# Patient Record
Sex: Female | Born: 1950 | Race: White | Hispanic: No | Smoking: Never smoker
Health system: Southern US, Community
[De-identification: ages and names within clinical notes are randomized; demographics above are authoritative.]

## PROBLEM LIST (undated history)

## (undated) DIAGNOSIS — R42 Dizziness and giddiness: Secondary | ICD-10-CM

## (undated) DIAGNOSIS — J45909 Unspecified asthma, uncomplicated: Secondary | ICD-10-CM

## (undated) DIAGNOSIS — G250 Essential tremor: Secondary | ICD-10-CM

## (undated) DIAGNOSIS — M810 Age-related osteoporosis without current pathological fracture: Secondary | ICD-10-CM

## (undated) DIAGNOSIS — E538 Deficiency of other specified B group vitamins: Secondary | ICD-10-CM

## (undated) DIAGNOSIS — E7800 Pure hypercholesterolemia, unspecified: Secondary | ICD-10-CM

## (undated) HISTORY — DX: Pure hypercholesterolemia, unspecified: E78.00

## (undated) HISTORY — DX: Age-related osteoporosis without current pathological fracture: M81.0

## (undated) HISTORY — PX: TUBAL LIGATION: SHX77

## (undated) HISTORY — DX: Essential tremor: G25.0

## (undated) HISTORY — DX: Dizziness and giddiness: R42

## (undated) HISTORY — PX: TONSILLECTOMY: SUR1361

## (undated) HISTORY — PX: KYPHOPLASTY: SHX5884

---

## 1997-11-16 ENCOUNTER — Other Ambulatory Visit: Admission: RE | Admit: 1997-11-16 | Discharge: 1997-11-16 | Payer: Self-pay | Admitting: Obstetrics and Gynecology

## 2000-12-01 ENCOUNTER — Other Ambulatory Visit: Admission: RE | Admit: 2000-12-01 | Discharge: 2000-12-01 | Payer: Self-pay | Admitting: Obstetrics and Gynecology

## 2006-05-06 ENCOUNTER — Other Ambulatory Visit: Admission: RE | Admit: 2006-05-06 | Discharge: 2006-05-06 | Payer: Self-pay | Admitting: Obstetrics and Gynecology

## 2006-06-24 ENCOUNTER — Encounter: Admission: RE | Admit: 2006-06-24 | Discharge: 2006-07-21 | Payer: Self-pay | Admitting: Family Medicine

## 2007-01-13 DIAGNOSIS — M810 Age-related osteoporosis without current pathological fracture: Secondary | ICD-10-CM

## 2007-01-13 HISTORY — DX: Age-related osteoporosis without current pathological fracture: M81.0

## 2007-07-20 ENCOUNTER — Other Ambulatory Visit: Admission: RE | Admit: 2007-07-20 | Discharge: 2007-07-20 | Payer: Self-pay | Admitting: Obstetrics and Gynecology

## 2007-07-29 ENCOUNTER — Ambulatory Visit (HOSPITAL_COMMUNITY): Admission: RE | Admit: 2007-07-29 | Discharge: 2007-07-29 | Payer: Self-pay | Admitting: Family Medicine

## 2007-08-12 ENCOUNTER — Encounter: Admission: RE | Admit: 2007-08-12 | Discharge: 2007-08-12 | Payer: Self-pay | Admitting: Family Medicine

## 2008-11-15 ENCOUNTER — Encounter: Admission: RE | Admit: 2008-11-15 | Discharge: 2008-11-15 | Payer: Self-pay | Admitting: Family Medicine

## 2009-12-19 ENCOUNTER — Ambulatory Visit (HOSPITAL_COMMUNITY)
Admission: RE | Admit: 2009-12-19 | Discharge: 2009-12-19 | Payer: Self-pay | Source: Home / Self Care | Attending: Neurological Surgery | Admitting: Neurological Surgery

## 2010-02-03 ENCOUNTER — Encounter: Payer: Self-pay | Admitting: Family Medicine

## 2010-03-25 LAB — BASIC METABOLIC PANEL
CO2: 24 mEq/L (ref 19–32)
Calcium: 9.5 mg/dL (ref 8.4–10.5)
Chloride: 106 mEq/L (ref 96–112)
GFR calc Af Amer: >60 mL/min (ref >60)
GFR calc non Af Amer: >60 mL/min (ref >60)
Glucose, Bld: 104 mg/dL — ABNORMAL HIGH (ref 70–99)
Potassium: 4.1 mEq/L (ref 3.5–5.1)

## 2010-03-25 LAB — CBC
MCH: 32.4 pg (ref 26.0–34.0)
MCHC: 33.4 g/dL (ref 30.0–36.0)
RDW: 13.7 % (ref 11.5–15.5)

## 2010-03-25 LAB — SURGICAL PCR SCREEN: MRSA, PCR: NEGATIVE

## 2010-09-09 ENCOUNTER — Other Ambulatory Visit (HOSPITAL_COMMUNITY): Payer: Self-pay | Admitting: Family Medicine

## 2010-09-10 ENCOUNTER — Other Ambulatory Visit: Payer: Self-pay | Admitting: Family Medicine

## 2010-09-10 DIAGNOSIS — N6489 Other specified disorders of breast: Secondary | ICD-10-CM

## 2010-09-10 DIAGNOSIS — N63 Unspecified lump in unspecified breast: Secondary | ICD-10-CM

## 2010-09-11 ENCOUNTER — Other Ambulatory Visit: Payer: Self-pay | Admitting: Family Medicine

## 2010-09-11 DIAGNOSIS — N63 Unspecified lump in unspecified breast: Secondary | ICD-10-CM

## 2010-09-11 DIAGNOSIS — N6489 Other specified disorders of breast: Secondary | ICD-10-CM

## 2010-09-18 ENCOUNTER — Ambulatory Visit
Admission: RE | Admit: 2010-09-18 | Discharge: 2010-09-18 | Disposition: A | Discharge status: Home / Self Care | Payer: 59 | Source: Ambulatory Visit | Attending: Family Medicine | Admitting: Family Medicine

## 2010-09-18 DIAGNOSIS — N6489 Other specified disorders of breast: Secondary | ICD-10-CM

## 2010-09-18 DIAGNOSIS — N63 Unspecified lump in unspecified breast: Secondary | ICD-10-CM

## 2010-10-16 ENCOUNTER — Other Ambulatory Visit: Payer: Self-pay | Admitting: Family Medicine

## 2010-10-16 DIAGNOSIS — R945 Abnormal results of liver function studies: Secondary | ICD-10-CM

## 2010-10-28 ENCOUNTER — Ambulatory Visit
Admission: RE | Admit: 2010-10-28 | Discharge: 2010-10-28 | Disposition: A | Discharge status: Home / Self Care | Payer: 59 | Source: Ambulatory Visit | Attending: Family Medicine | Admitting: Family Medicine

## 2010-10-28 DIAGNOSIS — R945 Abnormal results of liver function studies: Secondary | ICD-10-CM

## 2011-01-30 ENCOUNTER — Other Ambulatory Visit: Payer: Self-pay | Admitting: Endocrinology

## 2011-01-30 DIAGNOSIS — R748 Abnormal levels of other serum enzymes: Secondary | ICD-10-CM

## 2011-02-17 ENCOUNTER — Ambulatory Visit (HOSPITAL_COMMUNITY): Payer: 59

## 2011-02-17 ENCOUNTER — Encounter (HOSPITAL_COMMUNITY): Payer: 59

## 2011-03-06 ENCOUNTER — Encounter (HOSPITAL_COMMUNITY)
Admission: RE | Admit: 2011-03-06 | Discharge: 2011-03-06 | Disposition: A | Discharge status: Home / Self Care | Payer: 59 | Source: Ambulatory Visit | Attending: Endocrinology | Admitting: Endocrinology

## 2011-03-06 ENCOUNTER — Ambulatory Visit (HOSPITAL_COMMUNITY)
Admission: RE | Admit: 2011-03-06 | Discharge: 2011-03-06 | Disposition: A | Discharge status: Home / Self Care | Payer: 59 | Source: Ambulatory Visit | Attending: Endocrinology | Admitting: Endocrinology

## 2011-03-06 DIAGNOSIS — R748 Abnormal levels of other serum enzymes: Secondary | ICD-10-CM | POA: Insufficient documentation

## 2011-03-06 MED ORDER — TECHNETIUM TC 99M MEDRONATE IV KIT
25.0000 | PACK | Freq: Once | INTRAVENOUS | Status: AC | PRN
  Administered 2011-03-06: 25 via INTRAVENOUS

## 2012-03-14 ENCOUNTER — Other Ambulatory Visit: Payer: Self-pay

## 2012-03-14 DIAGNOSIS — Z1231 Encounter for screening mammogram for malignant neoplasm of breast: Secondary | ICD-10-CM

## 2012-04-01 ENCOUNTER — Other Ambulatory Visit: Payer: Self-pay

## 2012-04-01 DIAGNOSIS — M810 Age-related osteoporosis without current pathological fracture: Secondary | ICD-10-CM

## 2012-04-12 HISTORY — PX: CATARACT EXTRACTION: SUR2

## 2012-04-21 ENCOUNTER — Other Ambulatory Visit: Payer: Self-pay | Admitting: Family Medicine

## 2012-04-21 DIAGNOSIS — M810 Age-related osteoporosis without current pathological fracture: Secondary | ICD-10-CM

## 2012-04-28 ENCOUNTER — Ambulatory Visit
Admission: RE | Admit: 2012-04-28 | Discharge: 2012-04-28 | Disposition: A | Discharge status: Home / Self Care | Payer: Commercial Managed Care - PPO | Source: Ambulatory Visit

## 2012-04-28 ENCOUNTER — Ambulatory Visit: Payer: 59

## 2012-04-28 DIAGNOSIS — Z1231 Encounter for screening mammogram for malignant neoplasm of breast: Secondary | ICD-10-CM

## 2012-04-28 DIAGNOSIS — M810 Age-related osteoporosis without current pathological fracture: Secondary | ICD-10-CM

## 2012-05-02 ENCOUNTER — Other Ambulatory Visit (HOSPITAL_COMMUNITY): Payer: Self-pay | Admitting: Family Medicine

## 2012-05-02 DIAGNOSIS — M810 Age-related osteoporosis without current pathological fracture: Secondary | ICD-10-CM

## 2012-06-24 ENCOUNTER — Ambulatory Visit (HOSPITAL_COMMUNITY)
Admission: RE | Admit: 2012-06-24 | Discharge: 2012-06-24 | Disposition: A | Discharge status: Home / Self Care | Payer: Commercial Managed Care - PPO | Source: Ambulatory Visit | Attending: Family Medicine | Admitting: Family Medicine

## 2012-06-24 DIAGNOSIS — Z13820 Encounter for screening for osteoporosis: Secondary | ICD-10-CM | POA: Insufficient documentation

## 2012-06-24 DIAGNOSIS — Z780 Asymptomatic menopausal state: Secondary | ICD-10-CM | POA: Insufficient documentation

## 2012-06-24 DIAGNOSIS — M810 Age-related osteoporosis without current pathological fracture: Secondary | ICD-10-CM

## 2012-07-19 ENCOUNTER — Ambulatory Visit: Payer: Self-pay | Admitting: Obstetrics and Gynecology

## 2012-07-26 ENCOUNTER — Ambulatory Visit: Payer: Self-pay | Admitting: Obstetrics and Gynecology

## 2012-07-29 ENCOUNTER — Ambulatory Visit: Payer: Self-pay | Admitting: Obstetrics and Gynecology

## 2013-06-21 ENCOUNTER — Encounter: Payer: Self-pay | Admitting: Obstetrics and Gynecology

## 2013-06-23 ENCOUNTER — Ambulatory Visit (INDEPENDENT_AMBULATORY_CARE_PROVIDER_SITE_OTHER): Payer: Commercial Managed Care - PPO | Admitting: Gynecology

## 2013-06-23 ENCOUNTER — Encounter: Payer: Self-pay | Admitting: Gynecology

## 2013-06-23 VITALS — BP 128/80 | HR 78 | Resp 16 | Ht 62.75 in | Wt 161.0 lb

## 2013-06-23 DIAGNOSIS — Z01419 Encounter for gynecological examination (general) (routine) without abnormal findings: Secondary | ICD-10-CM

## 2013-06-23 DIAGNOSIS — M810 Age-related osteoporosis without current pathological fracture: Secondary | ICD-10-CM

## 2013-06-23 DIAGNOSIS — G250 Essential tremor: Secondary | ICD-10-CM | POA: Insufficient documentation

## 2013-06-23 NOTE — Patient Instructions (Signed)

## 2013-06-23 NOTE — Progress Notes (Signed)
63 y.o. Married Caucasian female   G2P2002 here for annual exam. Pt reports menses are absent due to Menopause. She does not report hot flashes, does not have night sweats, does not have vaginal dryness.  She is not using lubricants.  She does not report post-menopasual bleeding.  Pt is off forteo since 5/14, now on Boniva-tolertaing well.  Mother aslo with early osteoporosis, also has osteomalacia.  Patient's last menstrual period was 06/24/2003.          Sexually active: no  The current method of family planning is post menopausal status.    Exercising: no  The patient does not participate in regular exercise at present. Last pap: 03/18/11 NEG HR HPV Abnormal PAP: No Mammogram: 04/28/12 Bi-Rads 1; appt for July BSE: yes Colonoscopy: 08/2011 Normal f/u in 10 years ago  DEXA: 06/24/12-osteoporosis Alcohol: no Tobacco: no  Labs: Selena BattenWendy Mcneil, MD  Health Maintenance  Topic Date Due  . Pap Smear  05/16/1970  . Tetanus/tdap  05/16/1971  . Colonoscopy  05/16/2002  . Zostavax  05/15/2012  . Influenza Vaccine  08/12/2013  . Mammogram  04/29/2014    Family History  Problem Relation Age of Onset  . Heart disease Father   . Hypothyroidism Mother   . Osteoporosis Mother   . Hypertension Mother     Patient Active Problem List   Diagnosis Date Noted  . Osteoporosis   . Benign essential tremor     Past Medical History  Diagnosis Date  . Benign essential tremor   . Vertigo   . Hypercholesteremia   . Osteoporosis 2009    on forteo    Past Surgical History  Procedure Laterality Date  . Bilateral salpingoophorectomy    . Cataract extraction Left 04/2012    Allergies: Review of patient's allergies indicates no known allergies.  Current Outpatient Prescriptions  Medication Sig Dispense Refill  . Calcium Citrate (CITRACAL PO) Take 500 mg by mouth.      . Cholecalciferol (VITAMIN D-3) 5000 UNITS TABS Take by mouth.      . primidone (MYSOLINE) 250 MG tablet Take 250 mg by mouth 2  (two) times daily.       . propranolol (INDERAL) 80 MG tablet Take 80 mg by mouth 3 (three) times daily.      . rosuvastatin (CRESTOR) 20 MG tablet Take 20 mg by mouth daily.       No current facility-administered medications for this visit.    ROS: Pertinent items are noted in HPI.  Exam:    BP 128/80  Pulse 78  Resp 16  Ht 5' 2.75" (1.594 m)  Wt 161 lb (73.029 kg)  BMI 28.74 kg/m2  LMP 06/24/2003 Weight change: @WEIGHTCHANGE @ Last 3 height recordings:  Ht Readings from Last 3 Encounters:  06/23/13 5' 2.75" (1.594 m)   General appearance: alert, cooperative and appears stated age Head: Normocephalic, without obvious abnormality, atraumatic Neck: no adenopathy, no carotid bruit, no JVD, supple, symmetrical, trachea midline and thyroid not enlarged, symmetric, no tenderness/mass/nodules Lungs: clear to auscultation bilaterally Breasts: normal appearance, no masses or tenderness Heart: regular rate and rhythm, S1, S2 normal, no murmur, click, rub or gallop Abdomen: soft, non-tender; bowel sounds normal; no masses,  no organomegaly Extremities: extremities normal, atraumatic, no cyanosis or edema Skin: Skin color, texture, turgor normal. No rashes or lesions Lymph nodes: Cervical, supraclavicular, and axillary nodes normal. no inguinal nodes palpated Neurologic: Grossly normal   Pelvic: External genitalia:  normal escutcheon  Urethra: normal appearing urethra with no masses, tenderness or lesions              Bartholins and Skenes: Bartholin's, Urethra, Skene's normal                 Vagina: normal appearing vagina with normal color and discharge, no lesions              Cervix: normal appearance              Pap taken: no        Bimanual Exam:  Uterus:  uterus is normal size, shape, consistency and nontender                                      Adnexa:    no masses                                      Rectovaginal: Confirms                                       Anus:  normal sphincter tone, no lesions  A: well woman osteoporosis     P: mammogram-dense breast recommend 3D pap smear guidelines reviewed F/u with Dr Hollice GongMc Neill re osteoporosis counseled on breast self exam, mammography screening, adequate intake of calcium and vitamin D, diet and exercise return annually or prn Discussed PAP guideline changes, importance of weight bearing exercises, calcium, vit D and balanced diet.  An After Visit Summary was printed and given to the patient.

## 2013-11-13 ENCOUNTER — Encounter: Payer: Self-pay | Admitting: Gynecology

## 2014-06-06 ENCOUNTER — Other Ambulatory Visit: Payer: Self-pay

## 2014-06-06 DIAGNOSIS — Z1231 Encounter for screening mammogram for malignant neoplasm of breast: Secondary | ICD-10-CM

## 2014-06-19 ENCOUNTER — Ambulatory Visit
Admission: RE | Admit: 2014-06-19 | Discharge: 2014-06-19 | Disposition: A | Discharge status: Home / Self Care | Payer: Commercial Managed Care - PPO | Source: Ambulatory Visit

## 2014-06-19 ENCOUNTER — Encounter (INDEPENDENT_AMBULATORY_CARE_PROVIDER_SITE_OTHER): Payer: Self-pay

## 2014-06-19 DIAGNOSIS — Z1231 Encounter for screening mammogram for malignant neoplasm of breast: Secondary | ICD-10-CM

## 2014-12-31 ENCOUNTER — Ambulatory Visit
Admission: RE | Admit: 2014-12-31 | Discharge: 2014-12-31 | Disposition: A | Discharge status: Home / Self Care | Payer: Commercial Managed Care - PPO | Source: Ambulatory Visit | Attending: Family Medicine | Admitting: Family Medicine

## 2014-12-31 ENCOUNTER — Other Ambulatory Visit: Payer: Self-pay | Admitting: Family Medicine

## 2014-12-31 DIAGNOSIS — R52 Pain, unspecified: Secondary | ICD-10-CM

## 2015-02-04 ENCOUNTER — Other Ambulatory Visit: Payer: Self-pay | Admitting: Neurological Surgery

## 2015-02-11 ENCOUNTER — Encounter (HOSPITAL_COMMUNITY): Payer: Self-pay

## 2015-02-11 ENCOUNTER — Encounter (HOSPITAL_COMMUNITY)
Admission: RE | Admit: 2015-02-11 | Discharge: 2015-02-11 | Disposition: A | Discharge status: Home / Self Care | Payer: Commercial Managed Care - PPO | Source: Ambulatory Visit | Attending: Neurological Surgery | Admitting: Neurological Surgery

## 2015-02-11 DIAGNOSIS — M8448XG Pathological fracture, other site, subsequent encounter for fracture with delayed healing: Secondary | ICD-10-CM | POA: Insufficient documentation

## 2015-02-11 DIAGNOSIS — Z01812 Encounter for preprocedural laboratory examination: Secondary | ICD-10-CM | POA: Insufficient documentation

## 2015-02-11 HISTORY — DX: Unspecified asthma, uncomplicated: J45.909

## 2015-02-11 LAB — BASIC METABOLIC PANEL
Anion gap: 13 (ref 5–15)
BUN: 7 mg/dL (ref 6–20)
CO2: 25 mmol/L (ref 22–32)
Calcium: 9.6 mg/dL (ref 8.9–10.3)
Chloride: 103 mmol/L (ref 101–111)
Creatinine, Ser: 0.74 mg/dL (ref 0.44–1.00)
GFR calc Af Amer: >60 mL/min (ref >60)
GFR calc non Af Amer: >60 mL/min (ref >60)
Glucose, Bld: 95 mg/dL (ref 65–99)
POTASSIUM: 4.7 mmol/L (ref 3.5–5.1)
SODIUM: 141 mmol/L (ref 135–145)

## 2015-02-11 LAB — CBC
HEMATOCRIT: 39.5 % (ref 36.0–46.0)
Hemoglobin: 12.9 g/dL (ref 12.0–15.0)
MCH: 30.9 pg (ref 26.0–34.0)
MCHC: 32.7 g/dL (ref 30.0–36.0)
MCV: 94.7 fL (ref 78.0–100.0)
Platelets: 275 10*3/uL (ref 150–400)
RBC: 4.17 MIL/uL (ref 3.87–5.11)
RDW: 13.2 % (ref 11.5–15.5)
WBC: 10.4 10*3/uL (ref 4.0–10.5)

## 2015-02-11 LAB — SURGICAL PCR SCREEN
MRSA, PCR: NEGATIVE
Staphylococcus aureus: NEGATIVE

## 2015-02-11 NOTE — Pre-Procedure Instructions (Signed)
Hannah Perez  02/11/2015      [facility] [address] [place], [place] - [address] AT [facility] [address] [facility] [address] [place] [address] Phone: [phone] Fax: [phone]  [facility] - [place], [place] - [address] [place] [address] [place] [address] Phone: [phone] Fax: [phone]    Your procedure is scheduled on Feb3  Report to [facility] Admitting at 1200 A.M.  Call this number if you have problems the morning of surgery:  [phone]   Remember:  Do not eat food or drink liquids after midnight.  Take these medicines the morning of surgery with A SIP OF WATER primidone (Mysoline), propranolol (Inderal), Cyclobenzaprine (Flexeril) if needed  Stop taking Asprin, Ibuprofen, Advil, Motrin, Aleve, BC's, Goody's, Herbal medications, Fish Oil   Do not wear jewelry, make-up or nail polish.  Do not wear lotions, powders, or perfumes.  You may wear deodorant.  Do not shave 48 hours prior to surgery.  Men may shave face and neck.  Do not bring valuables to the hospital.  [facility] is not responsible for any belongings or valuables.  Contacts, dentures or bridgework may not be worn into surgery.  Leave your suitcase in the car.  After surgery it may be brought to your room.  For patients admitted to the hospital, discharge time will be determined by your treatment team.  Patients discharged the day of surgery will not be allowed to drive home.    Special instructions:  [facility] - Preparing for Surgery  Before surgery, you can play an important role.  Because skin is not sterile, your skin needs to be as free of germs as possible.  You can reduce the number of germs on you skin by washing with CHG (chlorahexidine gluconate) soap before surgery.  CHG is an antiseptic cleaner which kills germs and bonds with the skin to continue killing germs even after washing.  Please DO NOT use  if you have an allergy to CHG or antibacterial soaps.  If your skin becomes reddened/irritated stop using the CHG and inform your nurse when you arrive at Short Stay.  Do not shave (including legs and underarms) for at least 48 hours prior to the first CHG shower.  You may shave your face.  Please follow these instructions carefully:   1.  Shower with CHG Soap the night before surgery and the   morning of Surgery.  2.  If you choose to wash your hair, wash your hair first as usual with your  normal shampoo.  3.  After you shampoo, rinse your hair and body thoroughly to remove the Shampoo.  4.  Use CHG as you would any other liquid soap.  You can apply chg directly  to the skin and wash gently with scrungie or a clean washcloth.  5.  Apply the CHG Soap to your body ONLY FROM THE NECK DOWN.   Do not use on open wounds or open sores.  Avoid contact with your eyes,  ears, mouth and genitals (private parts).  Wash genitals (private parts)  with your normal soap.  6.  Wash thoroughly, paying special attention to the area where your surgery will be performed.  7.  Thoroughly rinse your body with warm water from the neck down.  8.  DO NOT shower/wash with your normal soap after using and rinsing off   the CHG Soap.  9.  Pat yourself dry with a clean towel.            10.  Wear clean pajamas.            11.  Place clean sheets on your bed the night of your first shower and do not sleep with pets.  Day of Surgery  Do not apply any lotions/deoderants the morning of surgery.  Please wear clean clothes to the hospital/surgery center.     Please read over the following fact sheets that you were given. Pain Booklet, Coughing and Deep Breathing, MRSA Information and Surgical Site Infection Prevention

## 2015-02-11 NOTE — Progress Notes (Signed)
PCP is Dr. Gweneth Dimitri Denies ever seeing a cardiologist. Denies ever having a card cath, echo, or stress test.

## 2015-02-15 ENCOUNTER — Ambulatory Visit (HOSPITAL_COMMUNITY): Payer: Commercial Managed Care - PPO

## 2015-02-15 ENCOUNTER — Ambulatory Visit (HOSPITAL_COMMUNITY): Payer: Commercial Managed Care - PPO | Admitting: Certified Registered Nurse Anesthetist

## 2015-02-15 ENCOUNTER — Encounter (HOSPITAL_COMMUNITY)
Admission: RE | Disposition: A | Discharge status: Home / Self Care | Payer: Self-pay | Source: Ambulatory Visit | Attending: Neurological Surgery

## 2015-02-15 ENCOUNTER — Ambulatory Visit (HOSPITAL_COMMUNITY)
Admission: RE | Admit: 2015-02-15 | Discharge: 2015-02-15 | Disposition: A | Discharge status: Home / Self Care | Payer: Commercial Managed Care - PPO | Source: Ambulatory Visit | Attending: Neurological Surgery | Admitting: Neurological Surgery

## 2015-02-15 ENCOUNTER — Encounter (HOSPITAL_COMMUNITY): Payer: Self-pay | Admitting: General Practice

## 2015-02-15 DIAGNOSIS — Z419 Encounter for procedure for purposes other than remedying health state, unspecified: Secondary | ICD-10-CM

## 2015-02-15 DIAGNOSIS — M1990 Unspecified osteoarthritis, unspecified site: Secondary | ICD-10-CM | POA: Diagnosis not present

## 2015-02-15 DIAGNOSIS — M8448XA Pathological fracture, other site, initial encounter for fracture: Secondary | ICD-10-CM | POA: Insufficient documentation

## 2015-02-15 DIAGNOSIS — S22069G Unspecified fracture of T7-T8 vertebra, subsequent encounter for fracture with delayed healing: Secondary | ICD-10-CM

## 2015-02-15 HISTORY — PX: KYPHOPLASTY: SHX5884

## 2015-02-15 SURGERY — KYPHOPLASTY
Anesthesia: General

## 2015-02-15 MED ORDER — FENTANYL CITRATE (PF) 100 MCG/2ML IJ SOLN
INTRAMUSCULAR | Status: DC | PRN
  Administered 2015-02-15: 50 ug via INTRAVENOUS
  Administered 2015-02-15: 75 ug via INTRAVENOUS

## 2015-02-15 MED ORDER — ONDANSETRON HCL 4 MG/2ML IJ SOLN: 4.0000 mg | INTRAMUSCULAR | Status: DC | PRN

## 2015-02-15 MED ORDER — MIDAZOLAM HCL 2 MG/2ML IJ SOLN
INTRAMUSCULAR | Status: AC
  Filled 2015-02-15: qty 4

## 2015-02-15 MED ORDER — KETOROLAC TROMETHAMINE 15 MG/ML IJ SOLN
15.0000 mg | Freq: Four times a day (QID) | INTRAMUSCULAR | Status: DC
  Administered 2015-02-15: 15 mg via INTRAVENOUS

## 2015-02-15 MED ORDER — ONDANSETRON HCL 4 MG/2ML IJ SOLN
INTRAMUSCULAR | Status: AC
  Filled 2015-02-15: qty 4

## 2015-02-15 MED ORDER — CEFAZOLIN SODIUM-DEXTROSE 2-3 GM-% IV SOLR
2.0000 g | INTRAVENOUS | Status: AC
  Administered 2015-02-15: 2 g via INTRAVENOUS

## 2015-02-15 MED ORDER — ROCURONIUM BROMIDE 100 MG/10ML IV SOLN
INTRAVENOUS | Status: DC | PRN
  Administered 2015-02-15: 40 mg via INTRAVENOUS

## 2015-02-15 MED ORDER — BUPIVACAINE HCL (PF) 0.25 % IJ SOLN
INTRAMUSCULAR | Status: DC | PRN
  Administered 2015-02-15: 8 mL

## 2015-02-15 MED ORDER — ACETAMINOPHEN 650 MG RE SUPP: 650.0000 mg | RECTAL | Status: DC | PRN

## 2015-02-15 MED ORDER — MIDAZOLAM HCL 5 MG/5ML IJ SOLN
INTRAMUSCULAR | Status: DC | PRN
  Administered 2015-02-15: 2 mg via INTRAVENOUS

## 2015-02-15 MED ORDER — PROPOFOL 10 MG/ML IV BOLUS
INTRAVENOUS | Status: DC | PRN
  Administered 2015-02-15: 150 mg via INTRAVENOUS

## 2015-02-15 MED ORDER — PROPOFOL 10 MG/ML IV BOLUS
INTRAVENOUS | Status: AC
  Filled 2015-02-15: qty 20

## 2015-02-15 MED ORDER — 0.9 % SODIUM CHLORIDE (POUR BTL) OPTIME
TOPICAL | Status: DC | PRN
  Administered 2015-02-15: 1000 mL

## 2015-02-15 MED ORDER — PHENOL 1.4 % MT LIQD: 1.0000 | OROMUCOSAL | Status: DC | PRN

## 2015-02-15 MED ORDER — OXYCODONE-ACETAMINOPHEN 5-325 MG PO TABS
1.0000 | ORAL_TABLET | ORAL | Status: DC | PRN
  Administered 2015-02-15: 2 via ORAL
  Filled 2015-02-15: qty 2

## 2015-02-15 MED ORDER — ALUM & MAG HYDROXIDE-SIMETH 200-200-20 MG/5ML PO SUSP: 30.0000 mL | Freq: Four times a day (QID) | ORAL | Status: DC | PRN

## 2015-02-15 MED ORDER — LACTATED RINGERS IV SOLN
INTRAVENOUS | Status: DC
  Administered 2015-02-15 (×2): via INTRAVENOUS

## 2015-02-15 MED ORDER — HYDROMORPHONE HCL 1 MG/ML IJ SOLN
0.2500 mg | INTRAMUSCULAR | Status: DC | PRN
Start: 2015-02-15 — End: 2015-02-15

## 2015-02-15 MED ORDER — FENTANYL CITRATE (PF) 250 MCG/5ML IJ SOLN
INTRAMUSCULAR | Status: AC
  Filled 2015-02-15: qty 10

## 2015-02-15 MED ORDER — SODIUM CHLORIDE 0.9% FLUSH: 3.0000 mL | Freq: Two times a day (BID) | INTRAVENOUS | Status: DC

## 2015-02-15 MED ORDER — KETOROLAC TROMETHAMINE 15 MG/ML IJ SOLN
INTRAMUSCULAR | Status: AC
  Filled 2015-02-15: qty 1

## 2015-02-15 MED ORDER — NEOSTIGMINE METHYLSULFATE 10 MG/10ML IV SOLN
INTRAVENOUS | Status: DC | PRN
  Administered 2015-02-15: 4 mg via INTRAVENOUS

## 2015-02-15 MED ORDER — ONDANSETRON HCL 4 MG/2ML IJ SOLN
INTRAMUSCULAR | Status: DC | PRN
  Administered 2015-02-15: 4 mg via INTRAVENOUS

## 2015-02-15 MED ORDER — LIDOCAINE-EPINEPHRINE 1 %-1:100000 IJ SOLN
INTRAMUSCULAR | Status: DC | PRN
  Administered 2015-02-15: 8 mL

## 2015-02-15 MED ORDER — PROMETHAZINE HCL 25 MG/ML IJ SOLN: 6.2500 mg | INTRAMUSCULAR | Status: DC | PRN

## 2015-02-15 MED ORDER — ROCURONIUM BROMIDE 50 MG/5ML IV SOLN
INTRAVENOUS | Status: AC
  Filled 2015-02-15: qty 1

## 2015-02-15 MED ORDER — PHENYLEPHRINE HCL 10 MG/ML IJ SOLN
INTRAMUSCULAR | Status: DC | PRN
  Administered 2015-02-15: 40 ug via INTRAVENOUS
  Administered 2015-02-15: 80 ug via INTRAVENOUS

## 2015-02-15 MED ORDER — CEFAZOLIN SODIUM-DEXTROSE 2-3 GM-% IV SOLR
INTRAVENOUS | Status: AC
  Filled 2015-02-15: qty 50

## 2015-02-15 MED ORDER — SODIUM CHLORIDE 0.9% FLUSH: 3.0000 mL | INTRAVENOUS | Status: DC | PRN

## 2015-02-15 MED ORDER — LIDOCAINE HCL (CARDIAC) 20 MG/ML IV SOLN
INTRAVENOUS | Status: AC
  Filled 2015-02-15: qty 10

## 2015-02-15 MED ORDER — MENTHOL 3 MG MT LOZG: 1.0000 | LOZENGE | OROMUCOSAL | Status: DC | PRN

## 2015-02-15 MED ORDER — HYDROMORPHONE HCL 1 MG/ML IJ SOLN: 0.5000 mg | INTRAMUSCULAR | Status: DC | PRN

## 2015-02-15 MED ORDER — ACETAMINOPHEN 325 MG PO TABS: 650.0000 mg | ORAL_TABLET | ORAL | Status: DC | PRN

## 2015-02-15 MED ORDER — GLYCOPYRROLATE 0.2 MG/ML IJ SOLN
INTRAMUSCULAR | Status: DC | PRN
  Administered 2015-02-15: .6 mg via INTRAVENOUS

## 2015-02-15 MED ORDER — LIDOCAINE HCL (CARDIAC) 20 MG/ML IV SOLN
INTRAVENOUS | Status: DC | PRN
  Administered 2015-02-15: 60 mg via INTRAVENOUS

## 2015-02-15 SURGICAL SUPPLY — 45 items
ADH SKN CLS APL DERMABOND .7 (GAUZE/BANDAGES/DRESSINGS)
BANDAGE ADH SHEER 1  50/CT (GAUZE/BANDAGES/DRESSINGS) ×5 IMPLANT
BLADE CLIPPER SURG (BLADE) IMPLANT
BLADE SURG 11 STRL SS (BLADE) ×2 IMPLANT
CEMENT BONE KYPHX HV R (Orthopedic Implant) ×1 IMPLANT
DECANTER SPIKE VIAL GLASS SM (MISCELLANEOUS) ×2 IMPLANT
DERMABOND ADVANCED (GAUZE/BANDAGES/DRESSINGS)
DERMABOND ADVANCED .7 DNX12 (GAUZE/BANDAGES/DRESSINGS) IMPLANT
DEVICE BIOPSY BONE KYPHX (INSTRUMENTS) ×1 IMPLANT
DRAPE C-ARM 42X72 X-RAY (DRAPES) ×2 IMPLANT
DRAPE INCISE IOBAN 66X45 STRL (DRAPES) ×2 IMPLANT
DRAPE LAPAROTOMY 100X72X124 (DRAPES) ×2 IMPLANT
DRAPE PROXIMA HALF (DRAPES) ×2 IMPLANT
DURAPREP 26ML APPLICATOR (WOUND CARE) ×2 IMPLANT
GAUZE SPONGE 4X4 16PLY XRAY LF (GAUZE/BANDAGES/DRESSINGS) ×2 IMPLANT
GLOVE BIO SURGEON STRL SZ7.5 (GLOVE) IMPLANT
GLOVE BIOGEL PI IND STRL 7.5 (GLOVE) IMPLANT
GLOVE BIOGEL PI IND STRL 8.5 (GLOVE) ×1 IMPLANT
GLOVE BIOGEL PI INDICATOR 7.5 (GLOVE)
GLOVE BIOGEL PI INDICATOR 8.5 (GLOVE) ×1
GLOVE ECLIPSE 8.5 STRL (GLOVE) ×2 IMPLANT
GLOVE EXAM NITRILE LRG STRL (GLOVE) IMPLANT
GLOVE EXAM NITRILE MD LF STRL (GLOVE) IMPLANT
GLOVE EXAM NITRILE XL STR (GLOVE) IMPLANT
GLOVE EXAM NITRILE XS STR PU (GLOVE) IMPLANT
GOWN STRL REUS W/ TWL LRG LVL3 (GOWN DISPOSABLE) IMPLANT
GOWN STRL REUS W/ TWL XL LVL3 (GOWN DISPOSABLE) ×1 IMPLANT
GOWN STRL REUS W/TWL 2XL LVL3 (GOWN DISPOSABLE) ×2 IMPLANT
GOWN STRL REUS W/TWL LRG LVL3 (GOWN DISPOSABLE)
GOWN STRL REUS W/TWL XL LVL3 (GOWN DISPOSABLE) ×2
KIT BASIN OR (CUSTOM PROCEDURE TRAY) ×2 IMPLANT
KIT ROOM TURNOVER OR (KITS) ×2 IMPLANT
NDL HYPO 25X1 1.5 SAFETY (NEEDLE) ×1 IMPLANT
NEEDLE HYPO 25X1 1.5 SAFETY (NEEDLE) ×2 IMPLANT
NS IRRIG 1000ML POUR BTL (IV SOLUTION) ×2 IMPLANT
PACK SURGICAL SETUP 50X90 (CUSTOM PROCEDURE TRAY) ×2 IMPLANT
PAD ARMBOARD 7.5X6 YLW CONV (MISCELLANEOUS) ×6 IMPLANT
SPECIMEN JAR SMALL (MISCELLANEOUS) ×1 IMPLANT
SUT VIC AB 3-0 SH 8-18 (SUTURE) ×2 IMPLANT
SUT VIC AB 4-0 P-3 18X BRD (SUTURE) ×1 IMPLANT
SUT VIC AB 4-0 P3 18 (SUTURE) ×2
SYR CONTROL 10ML LL (SYRINGE) ×4 IMPLANT
TOWEL OR 17X24 6PK STRL BLUE (TOWEL DISPOSABLE) ×2 IMPLANT
TOWEL OR 17X26 10 PK STRL BLUE (TOWEL DISPOSABLE) ×2 IMPLANT
TRAY KYPHOPAK 15/3 ONESTEP 1ST (MISCELLANEOUS) ×1 IMPLANT

## 2015-02-15 NOTE — Anesthesia Preprocedure Evaluation (Signed)
Anesthesia Evaluation  Patient identified by MRN, date of birth, ID band Patient awake    Reviewed: Allergy & Precautions, NPO status , Patient's Chart, lab work & pertinent test results  History of Anesthesia Complications Negative for: history of anesthetic complications  Airway Mallampati: I  TM Distance: >3 FB Neck ROM: Full    Dental  (+) Teeth Intact   Pulmonary asthma ,    breath sounds clear to auscultation       Cardiovascular negative cardio ROS   Rhythm:Regular Rate:Normal     Neuro/Psych    GI/Hepatic negative GI ROS, Neg liver ROS,   Endo/Other  negative endocrine ROS  Renal/GU negative Renal ROS     Musculoskeletal osteoperosis      Abdominal   Peds  Hematology negative hematology ROS (+)   Anesthesia Other Findings Hard to move R shoulder   Reproductive/Obstetrics                             Anesthesia Physical Anesthesia Plan  ASA: II  Anesthesia Plan: General   Post-op Pain Management:    Induction: Intravenous  Airway Management Planned: Oral ETT  Additional Equipment:   Intra-op Plan:   Post-operative Plan: Extubation in OR  Informed Consent:   Plan Discussed with:   Anesthesia Plan Comments:         Anesthesia Quick Evaluation

## 2015-02-15 NOTE — Anesthesia Procedure Notes (Signed)
Procedure Name: Intubation Date/Time: 02/15/2015 4:01 PM Performed by: Dairl Ponder Pre-anesthesia Checklist: Patient identified, Timeout performed, Emergency Drugs available, Suction available and Patient being monitored Patient Re-evaluated:Patient Re-evaluated prior to inductionOxygen Delivery Method: Circle system utilized Preoxygenation: Pre-oxygenation with 100% oxygen Intubation Type: IV induction Ventilation: Mask ventilation without difficulty Laryngoscope Size: Mac and 3 Grade View: Grade I Tube type: Oral Tube size: 7.0 mm Number of attempts: 1 Airway Equipment and Method: Stylet Placement Confirmation: ETT inserted through vocal cords under direct vision,  breath sounds checked- equal and bilateral and positive ETCO2 Secured at: 22 cm Tube secured with: Tape Dental Injury: Teeth and Oropharynx as per pre-operative assessment

## 2015-02-15 NOTE — Progress Notes (Signed)
Discharge instructions and education given to patient with family at bedside and they verbalized understanding. Pain is mild to moderate, no swelling, no drainage, no redness noted on incision site. Patient walked in hallway with no problem, tolerated her food and voiding well. Discharge via wheelchair.

## 2015-02-15 NOTE — Transfer of Care (Signed)
Immediate Anesthesia Transfer of Care Note  Patient: Hannah Perez  Procedure(s) Performed: Procedure(s) with comments: Thoracic eight Kyphoplasty (N/A) - T8 Kyphoplasty  Patient Location: PACU  Anesthesia Type:General  Level of Consciousness: awake, alert  and oriented  Airway & Oxygen Therapy: Patient Spontanous Breathing and Patient connected to nasal cannula oxygen  Post-op Assessment: Report given to RN and Post -op Vital signs reviewed and stable  Post vital signs: Reviewed and stable  Last Vitals:  Filed Vitals:   02/15/15 1230 02/15/15 1653  BP: 131/78   Pulse: 77   Temp: 36.6 C 36.5 C    Complications: No apparent anesthesia complications

## 2015-02-15 NOTE — Anesthesia Postprocedure Evaluation (Signed)
Anesthesia Post Note  Patient: JANANN BOEVE  Procedure(s) Performed: Procedure(s) (LRB): Thoracic eight Kyphoplasty (N/A)  Patient location during evaluation: PACU Anesthesia Type: General Level of consciousness: awake and sedated Pain management: pain level controlled Vital Signs Assessment: post-procedure vital signs reviewed and stable Respiratory status: spontaneous breathing, nonlabored ventilation, respiratory function stable and patient connected to nasal cannula oxygen Cardiovascular status: blood pressure returned to baseline and stable Postop Assessment: no signs of nausea or vomiting Anesthetic complications: no    Last Vitals:  Filed Vitals:   02/15/15 1653 02/15/15 1700  BP:    Pulse:  67  Temp: 36.5 C   Resp:  24    Last Pain:  Filed Vitals:   02/15/15 1717  PainSc: 0-No pain      LLE Sensation: Full sensation (02/15/15 1715)   RLE Sensation: Full sensation (02/15/15 1715)      Kaleeya Hancock,JAMES TERRILL

## 2015-02-15 NOTE — H&P (Signed)
Hannah Perez is an 65 y.o. female.   Chief Complaint: T8 compression fracture HPI: Hannah Perez is a 65 year old individual who has had a previous compression fracture of L1. This was treated several years ago with vertebral augmentation. This helped control her pain well. She has developed midthoracic back pain for nearly 6 weeks time and recent films demonstrate that there are fractures of T7 T4 and T8. Recent MRI confirms that the T8 fracture is subacute being very edematous but the T7 and T4 fractures are older. The patient has been having a good deal of pain with this recent fracture and after being evaluated in the office we discussed vertebral augmentation with an acrylic balloon kyphoplasty for this vertebrae. She is now admitted for that procedure.  Past Medical History  Diagnosis Date  . Benign essential tremor   . Vertigo   . Hypercholesteremia   . Osteoporosis 2009    on forteo  . Asthma     trigger from dog hair, dust , grass    Past Surgical History  Procedure Laterality Date  . Cataract extraction Left 04/2012  . Tubal ligation    . Tonsillectomy    . Kyphoplasty      Family History  Problem Relation Age of Onset  . Heart disease Father   . Hypothyroidism Mother   . Osteoporosis Mother   . Hypertension Mother    Social History:  reports that she has never smoked. She has never used smokeless tobacco. She reports that she does not drink alcohol or use illicit drugs.  Allergies: No Known Allergies  No prescriptions prior to admission    No results found for this or any previous visit (from the past 48 hour(s)). No results found.  Review of Systems  Constitutional: Negative.   HENT: Negative.   Eyes: Negative.   Respiratory: Negative.   Cardiovascular: Negative.   Gastrointestinal: Negative.   Genitourinary: Negative.   Musculoskeletal: Positive for back pain.  Skin: Negative.   Neurological: Negative.   Endo/Heme/Allergies: Negative.    Psychiatric/Behavioral: Negative.     Last menstrual period 06/24/2003. Physical Exam  Constitutional: She is oriented to person, place, and time. She appears well-developed and well-nourished.  HENT:  Head: Normocephalic and atraumatic.  Eyes: Conjunctivae and EOM are normal. Pupils are equal, round, and reactive to light.  Neck: Normal range of motion. Neck supple.  Cardiovascular: Normal rate and regular rhythm.   Respiratory: Effort normal and breath sounds normal.  GI: Soft. Bowel sounds are normal.  Musculoskeletal:  Centralized back pain in the mid thoracic spine to palpation and percussion  Neurological: She is alert and oriented to person, place, and time. She has normal reflexes.  Skin: Skin is warm and dry.  Psychiatric: She has a normal mood and affect. Her behavior is normal. Judgment and thought content normal.     Assessment/Plan Acrylic balloon kyphoplasty T8 for subacute fracture of the T8 vertebral body.  Stefani Dama, MD 02/15/2015, 7:45 AM

## 2015-02-15 NOTE — Discharge Summary (Signed)
Physician Discharge Summary  Patient ID: LYNSEE WANDS MRN: [id number] DOB/AGE: [birth year] 65 y.o.  Admit date: 02/15/2015 Discharge date: 02/15/2015  Admission Diagnoses:t8 compression fracture  Discharge Diagnoses:t 8 compression fracture  Active Problems:   T8 vertebral fracture, with delayed healing, subsequent encounter   Discharged Condition: good  Hospital Course: t8 kyphoplasty  Consults: None  Significant Diagnostic Studies: none  Treatments: t8 kyphoplasty  Discharge Exam: Blood pressure 157/63, pulse 62, temperature 97.6 F (36.4 C), temperature source Oral, resp. rate 16, height 5' 3.5" (1.613 m), weight 73.755 kg (162 lb 9.6 oz), last menstrual period 06/24/2003, SpO2 97 %. normal function  Disposition: discharge home  Discharge Instructions    Call MD for:  redness, tenderness, or signs of infection (pain, swelling, redness, odor or green/yellow discharge around incision site)    Complete by:  As directed      Call MD for:  severe uncontrolled pain    Complete by:  As directed      Call MD for:  temperature >100.4    Complete by:  As directed      Diet - low sodium heart healthy    Complete by:  As directed      Increase activity slowly    Complete by:  As directed             Medication List    TAKE these medications        CITRACAL PO  Take 500 mg by mouth 2 (two) times daily.     CRESTOR 20 MG tablet  Generic drug:  rosuvastatin  Take 20 mg by mouth daily.     cyclobenzaprine 5 MG tablet  Commonly known as:  FLEXERIL  Take 5 mg by mouth 3 (three) times daily as needed for muscle spasms.     fenofibrate 54 MG tablet  Take 54 mg by mouth daily.     ibandronate 150 MG tablet  Commonly known as:  BONIVA  Take 150 mg by mouth every 30 (thirty) days. Take in the morning with a full glass of water, on an empty stomach, and do not take anything else by mouth or lie down for the next 30 min.     meloxicam 15 MG tablet  Commonly known as:  MOBIC   Take 15 mg by mouth daily.     primidone 250 MG tablet  Commonly known as:  MYSOLINE  Take 250 mg by mouth 2 (two) times daily.     propranolol 80 MG tablet  Commonly known as:  INDERAL  Take 80 mg by mouth daily.     Vitamin D-3 5000 UNITS Tabs  Take 1 tablet by mouth daily.         SignedStefani Dama 02/15/2015, 7:26 PM

## 2015-02-15 NOTE — Op Note (Signed)
Date of surgery: 02/15/2015 Preoperative diagnosis: T8 subacute pathologic compression fracture Postoperative diagnosis: T8 subacute pathologic compression fracture Procedure: T8 acrylic balloon kyphoplasty Surgeon: Kristeen Miss Anesthesia: Gen. endotracheal Indications: Hannah Perez is a 65 year old individual who's previously had an L1 compression fracture that I treated with acrylic balloon kyphoplasty. She's had some other fractures at T7 and then again at T4 these are old a recent MRI demonstrates a subacute fracture of the T 8 vertebrae with greater than 50% loss of height. She is advised regarding kyphoplasty and desired to have this done to help reduce the pain.  Procedure: The patient was brought to the operating room supine on a stretcher. After the smooth induction of general endotracheal anesthesia, she was carefully turned prone. The back was prepped with alcohol and DuraPrep and draped in a sterile fashion after biplane fluoroscopy was placed into position at the thoracic spinal level of T8. The level was confirmed in both planes by counting the vertebrae from her previous kyphoplasty and also by looking carefully at the vertebrae of T7 and T8 both of which had fractures. T7 however was old T8 was subacute. Once localized the skin on the left side of an entry point that was 9:00 to the pedicle of T8 was infiltrated with 4 mL of lidocaine 1% a stab incision was made in the skin with an 11 blade and then a Jamshidi needle was placed into the lateral aspect of the vertebral body of T8 and directed into the vertebral body. Inner cannula was removed and through this and her cannula then a drill was placed. The bone was noted to have some significantly rigid consistency. A balloon was placed into this cavity and inflated but met with substantial resistance after 1/2 mL. The cement was prepared and with the balloon deflated cement was inserted into the vertebral body through a cannula as the cement was  inserted lateral fluoroscopy was used became apparent towards the end of the injection of 1-1/2 mL at some of the cement was traveling posteriorly and on AP view it appeared to be lateral however this could not be verified. With the vertebrae being filled across the midline even with the cc and a half is decided not to place any more cement into the vertebrae. The inner cannula was withdrawn then in the outer cannula was withdrawn and the procedure was completed by placing a singular stitch in the subcuticular skin at the entry site. A dry sterile dressing was applied. Fluoroscopy was removed after final images were obtained. The patient was then returned to the recovery room after being awoken in the operating room.

## 2015-02-19 ENCOUNTER — Encounter (HOSPITAL_COMMUNITY): Payer: Self-pay | Admitting: Neurological Surgery

## 2016-07-28 ENCOUNTER — Ambulatory Visit: Payer: Commercial Managed Care - PPO | Admitting: Physical Therapy

## 2016-08-06 ENCOUNTER — Ambulatory Visit: Payer: Commercial Managed Care - PPO | Attending: Internal Medicine | Admitting: Physical Therapy

## 2016-08-06 ENCOUNTER — Encounter: Payer: Self-pay | Admitting: Physical Therapy

## 2016-08-06 DIAGNOSIS — M6281 Muscle weakness (generalized): Secondary | ICD-10-CM

## 2016-08-06 DIAGNOSIS — M546 Pain in thoracic spine: Secondary | ICD-10-CM | POA: Diagnosis not present

## 2016-08-06 DIAGNOSIS — M62830 Muscle spasm of back: Secondary | ICD-10-CM | POA: Diagnosis present

## 2016-08-06 DIAGNOSIS — R293 Abnormal posture: Secondary | ICD-10-CM | POA: Diagnosis present

## 2016-08-06 NOTE — Therapy (Signed)
[facility] Outpatient Rehabilitation [facility] [address] [place], [place], [address] Phone: [phone]   Fax:  [phone]  Physical Therapy Evaluation  Patient Details  Name: LATICIA VANNOSTRAND MRN: [id number] Date of Birth: [birth year] Referring Provider: Fulton Reek MD  Encounter Date: 08/06/2016      PT End of Session - 08/06/16 1732    Visit Number 1   Number of Visits 17   Date for PT Re-Evaluation 10/01/16   PT Start Time 1635   PT Stop Time 1725   PT Time Calculation (min) 50 min   Activity Tolerance Patient tolerated treatment well   Behavior During Therapy [facility] for tasks assessed/performed      Past Medical History:  Diagnosis Date  . Asthma    trigger from dog hair, dust , grass  . Benign essential tremor   . Hypercholesteremia   . Osteoporosis 2009   on forteo  . Vertigo     Past Surgical History:  Procedure Laterality Date  . CATARACT EXTRACTION Left 04/2012  . KYPHOPLASTY    . KYPHOPLASTY N/A 02/15/2015   Procedure: Thoracic eight Kyphoplasty;  Surgeon: Barnett Abu, MD;  Location: [facility];  Service: Neurosurgery;  Laterality: N/A;  T8 Kyphoplasty  . TONSILLECTOMY    . TUBAL LIGATION      There were no vitals filed for this visit.       Subjective Assessment - 08/06/16 1639    Subjective pt is a 66 y.o with CC of pain located in the mid/ upper thoracic spine off an on since her kyphoplasty, pt has a hx or 2 kyphoplastys with the most recent being in April 2018. she declined N/T, reporting mostly just pain that is worse with sitting without support or standing, and pain with any lifting activites. since the last surgery she reports no changes the pain has stayed the same.   Pertinent History hx of 2 kyphoplastys   Limitations Lifting   How long can you sit comfortably? unsupported about 5 min, and support 1 hour   How long can you stand comfortably? 10 min   How long can you walk comfortably? 10 min   Diagnostic tests dexa  scan (another scanned in Auguts)   Patient Stated Goals increase strength, support joints, increase walking endurance, be able to go shopping.    Currently in Pain? Yes   Pain Score 2   at worst 10/10   Pain Location Back   Pain Orientation Upper;Mid   Pain Descriptors / Indicators Sharp;Aching   Pain Type Chronic pain   Pain Frequency Intermittent   Aggravating Factors  sitting unsupported, prolonged standing, walking, lifting especially away from the body or carrying groceries.    Pain Relieving Factors sitting and resting (with supported back), aleve,    Effect of Pain on Daily Activities limited lifting, carrying, endurance with walking/ standing,             [facility] PT Assessment - 08/06/16 1653      Assessment   Medical Diagnosis Osteoporosis, and hx or vertebral compression fx   Referring Provider Fulton Reek MD   Onset Date/Surgical Date --  since last surgery 2017   Hand Dominance Right   Next MD Visit 1 year   Prior Therapy yes     Precautions   Precaution Comments no lifting     Restrictions   Weight Bearing Restrictions No     Balance Screen   Has the patient fallen in the past  6 months No   Has the patient had a decrease in activity level because of a fear of falling?  No   Is the patient reluctant to leave their home because of a fear of falling?  No     Home Environment   Living Environment Private residence   Living Arrangements Spouse/significant other   Available Help at Discharge Family;Available PRN/intermittently   Type of Home House   Home Access Stairs to enter   Entrance Stairs-Number of Steps 5   Entrance Stairs-Rails Right  can reach railing and side of the house for assist   Home Layout Two level   Alternate Level Stairs-Number of Steps 16   Alternate Level Stairs-Rails Can reach both     Prior Function   Level of Independence Independent   Vocation Full time employment  Loss adjuster, charteredbusiness manager at [employer]   Vocation Requirements  prolonged sitting, standing, walking   Leisure yard work     IT consultantCognition   Overall Cognitive Status Within Functional Limits for tasks assessed     Observation/Other Assessments   Focus on Therapeutic Outcomes (FOTO)  53% limited  predicted 42% limited     Posture/Postural Control   Posture/Postural Control Postural limitations   Postural Limitations Rounded Shoulders;Forward head     ROM / Strength   AROM / PROM / Strength AROM;Strength     AROM   AROM Assessment Site Shoulder;Thoracic;Lumbar   Right/Left Shoulder Right;Left   Lumbar Flexion 100   Lumbar Extension 10   Lumbar - Right Side Bend 20   Lumbar - Left Side Bend 20   Thoracic Flexion 10   Thoracic Extension 2   Thoracic - Right Side Bend 10  ERP   Thoracic - Left Side Bend 12  ERP     Strength   Strength Assessment Site Shoulder   Right/Left Shoulder Right;Left   Right Shoulder Flexion 4/5   Right Shoulder Extension 4-/5   Right Shoulder ABduction 4/5   Right Shoulder Internal Rotation 4+/5   Right Shoulder External Rotation 4+/5   Left Shoulder Flexion 4/5   Left Shoulder Extension 4-/5   Left Shoulder ABduction 4/5   Left Shoulder Internal Rotation 4+/5   Left Shoulder External Rotation 4+/5            Objective measurements completed on examination: See above findings.                  PT Education - 08/06/16 1731    Education provided Yes   Education Details evaluation findings, POC, goals, HEP with proper form/ rationale.    Person(s) Educated Patient   Methods Explanation;Verbal cues;Handout   Comprehension Verbalized understanding;Verbal cues required          PT Short Term Goals - 08/06/16 1744      PT SHORT TERM GOAL #1   Title pt will be I with inial HEP    Time 4   Period Weeks   Status New   Target Date 09/03/16     PT SHORT TERM GOAL #2   Title pt to demo proper posture and lifting and carrying mechanics to prevent and reduce back pain   Time 4   Period  Weeks   Status New   Target Date 09/03/16     PT SHORT TERM GOAL #3   Title pt to demonstrte reduced thoracolumbar paraspinal/ rhomboid tightness to reduce pain to </= 5/10 pain to improve thoracic mobility    Time 4  Period Weeks   Status New   Target Date 09/03/16           PT Long Term Goals - 08/06/16 1746      PT LONG TERM GOAL #1   Title pt to increase Thoracic flexion/ extension overall by >/= 10 degrees and side bending by 5 bil with </= to assist with functional mobility and pain reduction    Time 8   Period Weeks   Status New   Target Date 10/01/16     PT LONG TERM GOAL #2   Title pt to increase core strength to provide back stability with lifting and carrying activities with </= 2/10 pain   Time 8   Period Weeks   Status New   Target Date 10/01/16     PT LONG TERM GOAL #3   Title pt to be able to sit/ stand and walk for >/= 45 min with </= 2/10 pain inthe low back and to promote pt goals of return to exericse.    Time 8   Period Weeks   Status New   Target Date 10/01/16     PT LONG TERM GOAL #4   Title pt will increase FOTO score to </= 42% limited to demo improvement in function    Time 8   Period Weeks   Status New   Target Date 10/01/16     PT LONG TERM GOAL #5   Title pt to be I with all HEP given    Time 8   Period Weeks   Status New   Target Date 10/01/16                Plan - 08/06/16 1733    Clinical Impression Statement pt presents to OPP[facility]  with CC of mid/ upper thoracic pain with a hx or kyphoplasty reporting the most recent being in April of 2018. She demonstrates signficant thoracic kyphosis and limited thoracic mobility. TTP along bil thoracolumbar paraspinals and Rhomboid muscles. following manual trigger point release and HEP she reported decreased tension with sitting/ standing. she would benefit from physical therapy to decrease muscle tightness, improve thoracic mobility, increase core strength, postural awareness and  maximze her function by addressing the deficits listed.    History and Personal Factors relevant to plan of care: hx or severe osteoporosis, hx or mulitple kyphoplasty,    Clinical Presentation Evolving   Clinical Presentation due to: abnormal posture, limited thoracic mobility and muscle spasm, fluctuating pain depending on position.    Clinical Decision Making Moderate   Rehab Potential Good   PT Frequency 1x / week   PT Duration 8 weeks   PT Treatment/Interventions ADLs/Self Care Home Management;Cryotherapy;Electrical Stimulation;Iontophoresis 4mg /ml Dexamethasone;Moist Heat;Ultrasound;Therapeutic activities;Therapeutic exercise;Passive range of motion;Dry needling;Manual techniques;Taping   PT Next Visit Plan assess/ review HEP and update PRN, thoracic mobility, manual techniques, core strengthening. modalities PRN   PT Home Exercise Plan thoracic rotation (in sitting), scapular retraction, rhomboid stretching, supine marching   Consulted and Agree with Plan of Care Patient      Patient will benefit from skilled therapeutic intervention in order to improve the following deficits and impairments:  Pain, Improper body mechanics, Postural dysfunction, Impaired flexibility, Decreased strength, Increased fascial restricitons, Decreased endurance, Decreased activity tolerance  Visit Diagnosis: Pain in thoracic spine - Plan: PT plan of care cert/re-cert  Muscle spasm of back - Plan: PT plan of care cert/re-cert  Abnormal posture - Plan: PT plan of care cert/re-cert  Muscle weakness (generalized) -  Plan: PT plan of care cert/re-cert     Problem List Patient Active Problem List   Diagnosis Date Noted  . T8 vertebral fracture, with delayed healing, subsequent encounter 02/15/2015  . Osteoporosis   . Benign essential tremor    Cairo Lingenfelter PT, DPT, LAT, ATC  08/06/16  5:56 PM      [facility] [address] [place], [id number] Phone: [phone]   Fax:  [phone]  Name: Jacqulyn CaneLisa H Vanleeuwen MRN: [id number] Date of Birth: 03/24/1950

## 2016-08-20 ENCOUNTER — Ambulatory Visit: Payer: Commercial Managed Care - PPO | Admitting: Physical Therapy

## 2016-08-26 ENCOUNTER — Encounter: Payer: Commercial Managed Care - PPO | Admitting: Physical Therapy

## 2016-09-03 ENCOUNTER — Ambulatory Visit: Payer: Commercial Managed Care - PPO | Attending: Internal Medicine | Admitting: Physical Therapy

## 2016-09-03 ENCOUNTER — Encounter: Payer: Self-pay | Admitting: Physical Therapy

## 2016-09-03 DIAGNOSIS — M6281 Muscle weakness (generalized): Secondary | ICD-10-CM

## 2016-09-03 DIAGNOSIS — M546 Pain in thoracic spine: Secondary | ICD-10-CM | POA: Insufficient documentation

## 2016-09-03 DIAGNOSIS — R293 Abnormal posture: Secondary | ICD-10-CM | POA: Diagnosis present

## 2016-09-03 DIAGNOSIS — M62830 Muscle spasm of back: Secondary | ICD-10-CM | POA: Diagnosis present

## 2016-09-03 NOTE — Therapy (Signed)
[facility] Outpatient Rehabilitation [facility] [address] [place], [place], [address] Phone: [phone]   Fax:  [phone]  Physical Therapy Treatment  Patient Details  Name: Hannah Perez MRN: [id number] Date of Birth: 06-Nov-1950 Referring Provider: Fulton Reek MD  Encounter Date: 09/03/2016      PT End of Session - 09/03/16 1733    Visit Number 2   Number of Visits 17   Date for PT Re-Evaluation 10/01/16   PT Start Time 1631   PT Stop Time 1720   PT Time Calculation (min) 49 min   Activity Tolerance Patient tolerated treatment well   Behavior During Therapy [facility] for tasks assessed/performed      Past Medical History:  Diagnosis Date  . Asthma    trigger from dog hair, dust , grass  . Benign essential tremor   . Hypercholesteremia   . Osteoporosis 2009   on forteo  . Vertigo     Past Surgical History:  Procedure Laterality Date  . CATARACT EXTRACTION Left 04/2012  . KYPHOPLASTY    . KYPHOPLASTY N/A 02/15/2015   Procedure: Thoracic eight Kyphoplasty;  Surgeon: Barnett Abu, MD;  Location: [facility];  Service: Neurosurgery;  Laterality: N/A;  T8 Kyphoplasty  . TONSILLECTOMY    . TUBAL LIGATION      There were no vitals filed for this visit.      Subjective Assessment - 09/03/16 1634    Subjective "The exercises are going well, i've worked on the manual trigger point release and it really has helped"    Currently in Pain? Yes   Pain Score 1    Pain Location Back   Pain Descriptors / Indicators Sore   Aggravating Factors  N/A   Pain Relieving Factors exercise                         [facility] Adult PT Treatment/Exercise - 09/03/16 1639      Neck Exercises: Machines for Strengthening   UBE (Upper Arm Bike) L4 x UE/ LE     Neck Exercises: Seated   Other Seated Exercise Rows 2 x 10, lower trap activation 2 x10   yellow band   Other Seated Exercise seated thoracic rotation 2 x 10  verbal cues to stay within  pain free ROM     Manual Therapy   Manual Therapy Joint mobilization   Manual therapy comments manual trigger point release on the L upper trap x 3   Joint Mobilization 1st rib mobs bil grade 3 with breathing in and out     Neck Exercises: Stretches   Upper Trapezius Stretch 2 reps;30 seconds   Other Neck Stretches rhomboid stretch 2 x 30                PT Education - 09/03/16 1732    Education provided Yes   Education Details manual trigger point release techniques and tools that can be used to assist with self  treatment.    Person(s) Educated Patient   Methods Explanation;Verbal cues   Comprehension Verbalized understanding;Verbal cues required          PT Short Term Goals - 08/06/16 1744      PT SHORT TERM GOAL #1   Title pt will be I with inial HEP    Time 4   Period Weeks   Status New   Target Date 09/03/16     PT SHORT TERM GOAL #2  Title pt to demo proper posture and lifting and carrying mechanics to prevent and reduce back pain   Time 4   Period Weeks   Status New   Target Date 09/03/16     PT SHORT TERM GOAL #3   Title pt to demonstrte reduced thoracolumbar paraspinal/ rhomboid tightness to reduce pain to </= 5/10 pain to improve thoracic mobility    Time 4   Period Weeks   Status New   Target Date 09/03/16           PT Long Term Goals - 08/06/16 1746      PT LONG TERM GOAL #1   Title pt to increase Thoracic flexion/ extension overall by >/= 10 degrees and side bending by 5 bil with </= to assist with functional mobility and pain reduction    Time 8   Period Weeks   Status New   Target Date 10/01/16     PT LONG TERM GOAL #2   Title pt to increase core strength to provide back stability with lifting and carrying activities with </= 2/10 pain   Time 8   Period Weeks   Status New   Target Date 10/01/16     PT LONG TERM GOAL #3   Title pt to be able to sit/ stand and walk for >/= 45 min with </= 2/10 pain inthe low back and to  promote pt goals of return to exericse.    Time 8   Period Weeks   Status New   Target Date 10/01/16     PT LONG TERM GOAL #4   Title pt will increase FOTO score to </= 42% limited to demo improvement in function    Time 8   Period Weeks   Status New   Target Date 10/01/16     PT LONG TERM GOAL #5   Title pt to be I with all HEP given    Time 8   Period Weeks   Status New   Target Date 10/01/16               Plan - 09/03/16 1733    Clinical Impression Statement pt reported signifcant relief of pain since the last session. She reported only soreness with rows which she was hiking her L shoulder significantly causing upper trap spasm. after correcting posture / form with exercise she reported no pain and improved visual exercise. following 1st rib mobs and thorcacic mobility exreicse she reported relief of pain and tightness.    PT Next Visit Plan update HEP PRN, manual techniques, core strengthening. scapular stability exercise. updatemodalities PRN   PT Home Exercise Plan thoracic rotation (in sitting), scapular retraction, rhomboid stretching, supine marching   Consulted and Agree with Plan of Care Patient      Patient will benefit from skilled therapeutic intervention in order to improve the following deficits and impairments:     Visit Diagnosis: No diagnosis found.     Problem List Patient Active Problem List   Diagnosis Date Noted  . T8 vertebral fracture, with delayed healing, subsequent encounter 02/15/2015  . Osteoporosis   . Benign essential tremor    Chealsea Paske PT, DPT, LAT, ATC  09/03/16  5:37 PM      [facility] [address] [place], [place], [address] Phone: [phone]   Fax:  [phone]  Name: NATE PERRI MRN: [id number] Date of Birth: 1950-09-09

## 2016-09-10 ENCOUNTER — Encounter: Payer: Self-pay | Admitting: Physical Therapy

## 2016-09-10 ENCOUNTER — Ambulatory Visit: Payer: Commercial Managed Care - PPO | Admitting: Physical Therapy

## 2016-09-10 DIAGNOSIS — R293 Abnormal posture: Secondary | ICD-10-CM

## 2016-09-10 DIAGNOSIS — M546 Pain in thoracic spine: Secondary | ICD-10-CM

## 2016-09-10 DIAGNOSIS — M6281 Muscle weakness (generalized): Secondary | ICD-10-CM

## 2016-09-10 DIAGNOSIS — M62830 Muscle spasm of back: Secondary | ICD-10-CM

## 2016-09-10 NOTE — Therapy (Signed)
[facility] Outpatient Rehabilitation [facility] [address] [place], [place], [address] Phone: [phone]   Fax:  [phone]  Physical Therapy Treatment  Patient Details  Name: ANISAH KUCK MRN: [id number] Date of Birth: 16-Oct-1950 Referring Provider: Fulton Reek MD  Encounter Date: 09/10/2016      PT End of Session - 09/10/16 1726    Visit Number 3   Number of Visits 17   Date for PT Re-Evaluation 10/01/16   PT Start Time 1631   PT Stop Time 1715   PT Time Calculation (min) 44 min   Activity Tolerance Patient tolerated treatment well   Behavior During Therapy [facility] for tasks assessed/performed      Past Medical History:  Diagnosis Date  . Asthma    trigger from dog hair, dust , grass  . Benign essential tremor   . Hypercholesteremia   . Osteoporosis 2009   on forteo  . Vertigo     Past Surgical History:  Procedure Laterality Date  . CATARACT EXTRACTION Left 04/2012  . KYPHOPLASTY    . KYPHOPLASTY N/A 02/15/2015   Procedure: Thoracic eight Kyphoplasty;  Surgeon: Barnett Abu, MD;  Location: [facility];  Service: Neurosurgery;  Laterality: N/A;  T8 Kyphoplasty  . TONSILLECTOMY    . TUBAL LIGATION      There were no vitals filed for this visit.      Subjective Assessment - 09/10/16 1633    Subjective "The exercises are going well, and I am doing better"   Currently in Pain? No/denies   Pain Score 0-No pain   Pain Location Back   Pain Orientation Upper;Mid   Aggravating Factors  N/A   Pain Relieving Factors exercise, stretching,                          [facility] Adult PT Treatment/Exercise - 09/10/16 1640      Neck Exercises: Machines for Strengthening   UBE (Upper Arm Bike) L4 x 4 min   changing direction at 2 min     Neck Exercises: Supine   Other Supine Exercise scapular retraction with ER 2 x 10 with yellow band  horizontal abduction 2 x 10 with yellow band   Other Supine Exercise thoracic mobility routine,  ceiling punches, horizontal abd/ abd, alternating ceiling punches, x to Y, and back stroke 1 x 10     Manual Therapy   Manual therapy comments manual trigger point release on the L upper trap x 3     Neck Exercises: Stretches   Upper Trapezius Stretch 2 reps;30 seconds   Levator Stretch 2 reps;30 seconds   Other Neck Stretches rhomboid stretch 2 x 30                PT Education - 09/10/16 1725    Education provided Yes   Education Details update HEP for thoracic mobility and scapular stability exercise   Person(s) Educated Patient   Methods Explanation;Verbal cues   Comprehension Verbalized understanding;Verbal cues required          PT Short Term Goals - 09/10/16 1728      PT SHORT TERM GOAL #1   Title pt will be I with inial HEP    Time 4   Period Weeks   Status Achieved     PT SHORT TERM GOAL #2   Title pt to demo proper posture and lifting and carrying mechanics to prevent and reduce back pain   Time  4   Period Weeks   Status Achieved     PT SHORT TERM GOAL #3   Title pt to demonstrte reduced thoracolumbar paraspinal/ rhomboid tightness to reduce pain to </= 5/10 pain to improve thoracic mobility    Time 4   Period Weeks   Status Achieved           PT Long Term Goals - 08/06/16 1746      PT LONG TERM GOAL #1   Title pt to increase Thoracic flexion/ extension overall by >/= 10 degrees and side bending by 5 bil with </= to assist with functional mobility and pain reduction    Time 8   Period Weeks   Status New   Target Date 10/01/16     PT LONG TERM GOAL #2   Title pt to increase core strength to provide back stability with lifting and carrying activities with </= 2/10 pain   Time 8   Period Weeks   Status New   Target Date 10/01/16     PT LONG TERM GOAL #3   Title pt to be able to sit/ stand and walk for >/= 45 min with </= 2/10 pain inthe low back and to promote pt goals of return to exericse.    Time 8   Period Weeks   Status New    Target Date 10/01/16     PT LONG TERM GOAL #4   Title pt will increase FOTO score to </= 42% limited to demo improvement in function    Time 8   Period Weeks   Status New   Target Date 10/01/16     PT LONG TERM GOAL #5   Title pt to be I with all HEP given    Time 8   Period Weeks   Status New   Target Date 10/01/16               Plan - 09/10/16 1726    Clinical Impression Statement Mrs. Sherron FlemingsGalvin continues to report no pain and significant improvement in function since the prevous session. focused thoracic mobility with pt in supine, and scapular stability. She reported she could tell she did some work but stated she had no pain.    PT Treatment/Interventions ADLs/Self Care Home Management;Cryotherapy;Electrical Stimulation;Iontophoresis 4mg /ml Dexamethasone;Moist Heat;Ultrasound;Therapeutic activities;Therapeutic exercise;Passive range of motion;Dry needling;Manual techniques;Taping   PT Next Visit Plan update HEP PRN, if doing well ROM, goals, FOTO, D/C,    PT Home Exercise Plan thoracic rotation (in sitting), scapular retraction, rhomboid stretching, supine marching, horizontal abduction, supine thoracic routine   Consulted and Agree with Plan of Care Patient      Patient will benefit from skilled therapeutic intervention in order to improve the following deficits and impairments:  Pain, Improper body mechanics, Postural dysfunction, Impaired flexibility, Decreased strength, Increased fascial restricitons, Decreased endurance, Decreased activity tolerance  Visit Diagnosis: Pain in thoracic spine  Muscle spasm of back  Abnormal posture  Muscle weakness (generalized)     Problem List Patient Active Problem List   Diagnosis Date Noted  . T8 vertebral fracture, with delayed healing, subsequent encounter 02/15/2015  . Osteoporosis   . Benign essential tremor    Brithney Bensen PT, DPT, LAT, ATC  09/10/16  5:29 PM      [facility] [address] [place], [place], [address] Phone: [phone]   Fax:  [phone]  Name: Jacqulyn CaneLisa H Chill MRN: [id number] Date of Birth: 07/15/1950

## 2016-09-23 ENCOUNTER — Ambulatory Visit: Payer: Commercial Managed Care - PPO | Attending: Internal Medicine | Admitting: Physical Therapy

## 2016-09-23 ENCOUNTER — Encounter: Payer: Self-pay | Admitting: Physical Therapy

## 2016-09-23 DIAGNOSIS — M546 Pain in thoracic spine: Secondary | ICD-10-CM

## 2016-09-23 DIAGNOSIS — R293 Abnormal posture: Secondary | ICD-10-CM | POA: Diagnosis present

## 2016-09-23 DIAGNOSIS — M62830 Muscle spasm of back: Secondary | ICD-10-CM | POA: Diagnosis present

## 2016-09-23 DIAGNOSIS — M6281 Muscle weakness (generalized): Secondary | ICD-10-CM | POA: Diagnosis present

## 2016-09-23 NOTE — Therapy (Signed)
[place], [place], [address] Phone: [phone]   Fax:  [phone]  Physical Therapy Evaluation / Discharge Summary  Patient Details  Name: Hannah Perez MRN: [id number] Date of Birth: 07-28-1950 Referring Provider: Sabra Heck MD  Encounter Date: 09/23/2016      PT End of Session - 09/23/16 1600    Visit Number 4   Number of Visits 17   Date for PT Re-Evaluation 10/01/16   PT Start Time 0272   PT Stop Time 1628   PT Time Calculation (min) 42 min   Activity Tolerance Patient tolerated treatment well   Behavior During Therapy [facility] for tasks assessed/performed      Past Medical History:  Diagnosis Date  . Asthma    trigger from dog hair, dust , grass  . Benign essential tremor   . Hypercholesteremia   . Osteoporosis 2009   on forteo  . Vertigo     Past Surgical History:  Procedure Laterality Date  . CATARACT EXTRACTION Left 04/2012  . KYPHOPLASTY    . KYPHOPLASTY N/A 02/15/2015   Procedure: Thoracic eight Kyphoplasty;  Surgeon: Kristeen Miss, MD;  Location: [facility];  Service: Neurosurgery;  Laterality: N/A;  T8 Kyphoplasty  . TONSILLECTOMY    . TUBAL LIGATION      There were no vitals filed for this visit.       Subjective Assessment - 09/23/16 1553    Subjective "things have been going well"   Currently in Pain? No/denies   Pain Score 0-No pain            [facility] PT Assessment - 09/23/16 1606      Observation/Other Assessments   Focus on Therapeutic Outcomes (FOTO)  43% limited     AROM   Lumbar Flexion 88   Lumbar Extension 13   Lumbar - Right Side Bend 20   Lumbar - Left Side Bend 20   Thoracic Flexion 12   Thoracic Extension 6   Thoracic - Right Side Bend 20   Thoracic - Left Side Bend 20     Strength   Right Shoulder Flexion 4/5   Right Shoulder Extension 4/5   Right Shoulder ABduction 4/5   Right Shoulder Internal Rotation 4+/5   Right Shoulder  External Rotation 4+/5   Left Shoulder Flexion 4/5   Left Shoulder Extension 4/5   Left Shoulder ABduction 4/5   Left Shoulder Internal Rotation 4+/5   Left Shoulder External Rotation 4+/5            Objective measurements completed on examination: See above findings.          [facility] Adult PT Treatment/Exercise - 09/23/16 1554      Neck Exercises: Machines for Strengthening   UBE (Upper Arm Bike) L1 x 6 min  changing diretion at 3 min     Neck Exercises: Standing   Other Standing Exercises Rows 2 x 10   with red theraband     Neck Exercises: Supine   Other Supine Exercise scapular retraction with ER 2 x 10 with yellow band   Other Supine Exercise thoracic mobility routine, ceiling punches, horizontal abd/ abd, alternating ceiling punches, x to Y, and back stroke 1 x 10     Neck Exercises: Stretches   Upper Trapezius Stretch 2 reps;30 seconds   Levator Stretch 2 reps;30 seconds   Other Neck Stretches rhomboid stretch 2 x 30  PT Education - 09/23/16 1643    Education provided Yes   Education Details reviewed previously provided HEP and benefits of continued exercise to promote continued pain relief and mobility   Person(s) Educated Patient   Methods Explanation;Verbal cues   Comprehension Verbalized understanding;Verbal cues required          PT Short Term Goals - 09/23/16 1614      PT SHORT TERM GOAL #1   Title pt will be I with inial HEP    Time 4   Period Weeks   Status Achieved     PT SHORT TERM GOAL #2   Title pt to demo proper posture and lifting and carrying mechanics to prevent and reduce back pain   Time 4   Period Weeks   Status Achieved     PT SHORT TERM GOAL #3   Title pt to demonstrte reduced thoracolumbar paraspinal/ rhomboid tightness to reduce pain to </= 5/10 pain to improve thoracic mobility    Time 4   Period Weeks   Status Achieved           PT Long Term Goals - 09/23/16 1614      PT LONG TERM GOAL  #1   Title pt to increase Thoracic flexion/ extension overall by >/= 10 degrees and side bending by 5 bil with </= to assist with functional mobility and pain reduction    Period Weeks   Status Partially Met     PT LONG TERM GOAL #2   Title pt to increase core strength to provide back stability with lifting and carrying activities with </= 2/10 pain   Time 8   Period Weeks   Status Achieved     PT LONG TERM GOAL #3   Title pt to be able to sit/ stand and walk for >/= 45 min with </= 2/10 pain inthe low back and to promote pt goals of return to exericse.    Time 8   Period Weeks   Status Achieved     PT LONG TERM GOAL #4   Title pt will increase FOTO score to </= 42% limited to demo improvement in function    Time 8   Period Weeks   Status Partially Met     PT LONG TERM GOAL #5   Title pt to be I with all HEP given    Time 8   Period Weeks   Status Achieved                Plan - 09/23/16 1704    Clinical Impression Statement Hannah Perez has made great progress with physical therapy improving thoracic mobility and additionally is pain free. she has met or partially met all goals today and reports she is able to maintain and progress her current level of function independently and will be discharged from PT today.    PT Treatment/Interventions ADLs/Self Care Home Management;Cryotherapy;Electrical Stimulation;Iontophoresis 87m/ml Dexamethasone;Moist Heat;Ultrasound;Therapeutic activities;Therapeutic exercise;Passive range of motion;Dry needling;Manual techniques;Taping   PT Next Visit Plan D/C   PT Home Exercise Plan thoracic rotation (in sitting), scapular retraction, rhomboid stretching, supine marching, horizontal abduction, supine thoracic routine   Consulted and Agree with Plan of Care Patient      Patient will benefit from skilled therapeutic intervention in order to improve the following deficits and impairments:  Pain, Improper body mechanics, Postural dysfunction,  Impaired flexibility, Decreased strength, Increased fascial restricitons, Decreased endurance, Decreased activity tolerance  Visit Diagnosis: Pain in thoracic spine  Muscle spasm  of back  Abnormal posture  Muscle weakness (generalized)     Problem List Patient Active Problem List   Diagnosis Date Noted  . T8 vertebral fracture, with delayed healing, subsequent encounter 02/15/2015  . Osteoporosis   . Benign essential tremor     Akeel Reffner PT, DPT, LAT, ATC  09/23/16  5:12 PM      [place] [facility] [address] [place], [place], [address] Phone: [phone]   Fax:  [phone]  Name: Hannah Perez MRN: [id number] Date of Birth: 11/24/1950    PHYSICAL THERAPY DISCHARGE SUMMARY  Visits from Start of Care: 4  Current functional level related to goals / functional outcomes: FOTO 43% limited   Remaining deficits: See above assessment   Education / Equipment: HEP, theraband, posture/lifting mechanics,   Plan: Patient agrees to discharge.  Patient goals were partially met. Patient is being discharged due to meeting the stated rehab goals.  ?????     Jaynee Winters PT, DPT, LAT, ATC  09/23/16  5:13 PM

## 2017-06-22 DIAGNOSIS — H26491 Other secondary cataract, right eye: Secondary | ICD-10-CM | POA: Diagnosis not present

## 2017-06-22 DIAGNOSIS — H43813 Vitreous degeneration, bilateral: Secondary | ICD-10-CM | POA: Diagnosis not present

## 2017-07-21 DIAGNOSIS — R251 Tremor, unspecified: Secondary | ICD-10-CM | POA: Diagnosis not present

## 2017-07-21 DIAGNOSIS — R946 Abnormal results of thyroid function studies: Secondary | ICD-10-CM | POA: Diagnosis not present

## 2017-07-21 DIAGNOSIS — Z1231 Encounter for screening mammogram for malignant neoplasm of breast: Secondary | ICD-10-CM | POA: Diagnosis not present

## 2017-07-21 DIAGNOSIS — M810 Age-related osteoporosis without current pathological fracture: Secondary | ICD-10-CM | POA: Diagnosis not present

## 2017-07-21 DIAGNOSIS — Z79899 Other long term (current) drug therapy: Secondary | ICD-10-CM | POA: Diagnosis not present

## 2017-07-21 DIAGNOSIS — E785 Hyperlipidemia, unspecified: Secondary | ICD-10-CM | POA: Diagnosis not present

## 2017-07-21 DIAGNOSIS — F4321 Adjustment disorder with depressed mood: Secondary | ICD-10-CM | POA: Diagnosis not present

## 2017-07-22 DIAGNOSIS — H26491 Other secondary cataract, right eye: Secondary | ICD-10-CM | POA: Diagnosis not present

## 2017-07-26 DIAGNOSIS — M810 Age-related osteoporosis without current pathological fracture: Secondary | ICD-10-CM | POA: Diagnosis not present

## 2017-07-26 DIAGNOSIS — Z8262 Family history of osteoporosis: Secondary | ICD-10-CM | POA: Diagnosis not present

## 2017-07-26 DIAGNOSIS — Z5181 Encounter for therapeutic drug level monitoring: Secondary | ICD-10-CM | POA: Diagnosis not present

## 2017-07-26 DIAGNOSIS — Z8781 Personal history of (healed) traumatic fracture: Secondary | ICD-10-CM | POA: Diagnosis not present

## 2017-07-28 DIAGNOSIS — Z79899 Other long term (current) drug therapy: Secondary | ICD-10-CM | POA: Diagnosis not present

## 2017-07-28 DIAGNOSIS — Z1389 Encounter for screening for other disorder: Secondary | ICD-10-CM | POA: Diagnosis not present

## 2017-07-28 DIAGNOSIS — R2689 Other abnormalities of gait and mobility: Secondary | ICD-10-CM | POA: Diagnosis not present

## 2017-07-28 DIAGNOSIS — R251 Tremor, unspecified: Secondary | ICD-10-CM | POA: Diagnosis not present

## 2017-07-28 DIAGNOSIS — Z23 Encounter for immunization: Secondary | ICD-10-CM | POA: Diagnosis not present

## 2017-07-28 DIAGNOSIS — M810 Age-related osteoporosis without current pathological fracture: Secondary | ICD-10-CM | POA: Diagnosis not present

## 2017-07-28 DIAGNOSIS — E785 Hyperlipidemia, unspecified: Secondary | ICD-10-CM | POA: Diagnosis not present

## 2017-10-11 ENCOUNTER — Other Ambulatory Visit: Payer: Self-pay

## 2017-10-11 ENCOUNTER — Ambulatory Visit: Payer: Medicare Other | Attending: Family Medicine | Admitting: Physical Therapy

## 2017-10-11 ENCOUNTER — Encounter: Payer: Self-pay | Admitting: Physical Therapy

## 2017-10-11 DIAGNOSIS — R293 Abnormal posture: Secondary | ICD-10-CM | POA: Diagnosis not present

## 2017-10-11 DIAGNOSIS — M6281 Muscle weakness (generalized): Secondary | ICD-10-CM | POA: Insufficient documentation

## 2017-10-11 DIAGNOSIS — R262 Difficulty in walking, not elsewhere classified: Secondary | ICD-10-CM | POA: Insufficient documentation

## 2017-10-11 NOTE — Therapy (Signed)
[place], [place], [address] Phone: [phone]   Fax:  [phone]  Physical Therapy Evaluation  Patient Details  Name: Hannah Perez MRN: [id number] Date of Birth: 28-Mar-1950 Referring Provider (PT): Cari Caraway, MD   Encounter Date: 10/11/2017  PT End of Session - 10/11/17 1335    Visit Number  1    Number of Visits  13    Date for PT Re-Evaluation  11/26/17    Authorization Type  MCR  PN at visit 10, KX at 18    PT Start Time  1330    PT Stop Time  1412    PT Time Calculation (min)  42 min    Equipment Utilized During Treatment  Gait belt    Activity Tolerance  Patient tolerated treatment well    Behavior During Therapy  [facility] for tasks assessed/performed       Past Medical History:  Diagnosis Date  . Asthma    trigger from dog hair, dust , grass  . Benign essential tremor   . Hypercholesteremia   . Osteoporosis 2009   on forteo  . Vertigo     Past Surgical History:  Procedure Laterality Date  . CATARACT EXTRACTION Left 04/2012  . KYPHOPLASTY    . KYPHOPLASTY N/A 02/15/2015   Procedure: Thoracic eight Kyphoplasty;  Surgeon: Kristeen Miss, MD;  Location: [facility];  Service: Neurosurgery;  Laterality: N/A;  T8 Kyphoplasty  . TONSILLECTOMY    . TUBAL LIGATION      There were no vitals filed for this visit.   Subjective Assessment - 10/11/17 1336    Subjective  Mostly my balance, I am afraid of falling. I try to find things to hold on to as I move around. I have not fallen yet. I feel off balance when I am moving around. My back is in pain on and off. I am retired now so I am not lifting as much which is helpful. I am working on cleaning out my house.     Patient Stated Goals  improve balance, strengthen muscles in my legs    Currently in Pain?  No/denies         [facility] PT Assessment - 10/11/17 0001      Assessment   Medical Diagnosis  osteoporosis & fall risk    Referring Provider  (PT)  Cari Caraway, MD    Onset Date/Surgical Date  --   a year or two ago   Hand Dominance  Right      Precautions   Precautions  Fall      Restrictions   Weight Bearing Restrictions  No      Balance Screen   Has the patient fallen in the past 6 months  No      [place] residence    Living Arrangements  Spouse/significant other    Additional Comments  multi level home      Prior Function   Level of [place]  Retired      Associate Professor   Overall Cognitive Status  Within Functional Limits for tasks assessed      Observation/Other Assessments   Focus on Therapeutic Outcomes (FOTO)   --   incorrect body part entered     Sensation   Additional Comments  [facility]      Posture/Postural Control   Posture Comments  fwd head, increased kyphosis  ROM / Strength   AROM / PROM / Strength  Strength      Strength   Overall Strength Comments  bil knees 5/5    Strength Assessment Site  Hip    Right/Left Hip  Right;Left    Right Hip Flexion  5/5    Right Hip Extension  4-/5   meas in supine   Right Hip ABduction  4-/5    Left Hip Flexion  5/5    Left Hip Extension  4-/5   meas in supine   Left Hip ABduction  4-/5      Standardized Balance Assessment   Standardized Balance Assessment  Berg Balance Test      Berg Balance Test   Sit to Stand  Able to stand without using hands and stabilize independently    Standing Unsupported  Able to stand safely 2 minutes    Sitting with Back Unsupported but Feet Supported on Floor or Stool  Able to sit safely and securely 2 minutes    Stand to Sit  Sits safely with minimal use of hands    Transfers  Able to transfer safely, minor use of hands    Standing Unsupported with Eyes Closed  Able to stand 10 seconds safely    Standing Ubsupported with Feet Together  Able to place feet together independently and stand 1 minute safely    From Standing, Reach Forward with Outstretched  Arm  Can reach forward >5 cm safely (2")    From Standing Position, Pick up Object from Floor  Able to pick up shoe safely and easily    From Standing Position, Turn to Look Behind Over each Shoulder  Looks behind one side only/other side shows less weight shift    Turn 360 Degrees  Able to turn 360 degrees safely but slowly    Standing Unsupported, Alternately Place Feet on Step/Stool  Able to stand independently and complete 8 steps >20 seconds    Standing Unsupported, One Foot in Front  Able to take small step independently and hold 30 seconds    Standing on One Leg  Able to lift leg independently and hold equal to or more than 3 seconds    Total Score  46      High Level Balance   High Level Balance Comments  flat foot gait without arm swing, loses gait path with head turns, slows significantly with head turns, overlapping feet with turns                Objective measurements completed on examination: See above findings.      [facility] Adult PT Treatment/Exercise - 10/11/17 0001      Ambulation/Gait   Gait Comments  arm swing & trunk rotation, heel-toe             PT Education - 10/11/17 1424    Education Details  anatomy of condition, POC, HEP, exercise form/rationale, gait pattern, aquatics, Tai Chi    [employer]) Educated  Patient    Methods  Explanation;Demonstration;Tactile cues;Verbal cues    Comprehension  Verbalized understanding;Returned demonstration;Verbal cues required;Tactile cues required;Need further instruction          PT Long Term Goals - 10/11/17 1417      PT LONG TERM GOAL #1   Title  Pt will demo gross LE strength to 5/5    Baseline  see flowsheet    Time  6    Period  Weeks    Status  New  Target Date  11/26/17      PT LONG TERM GOAL #2   Title  BERG to improve by 4 points    Baseline  MDC 3.3    Time  6    Period  Weeks    Status  New    Target Date  11/26/17      PT LONG TERM GOAL #3   Title  Pt will verbalize improved  confidence in daily ambulation in open spaces by 50%    Baseline  reports fear of falling especially in longer distances such as parking lots    Time  6    Period  Weeks    Status  New    Target Date  11/26/17      PT LONG TERM GOAL #4   Title  Pt will be safe and independent with Tai Chi exercises for home use    Baseline  will teach and progress as appropriate    Time  6    Period  Weeks    Status  New    Target Date  11/26/17      PT LONG TERM GOAL #5   Title  .             Plan - 10/11/17 1412    Clinical Impression Statement  Pt presents to PT with complaints of poor balance control during daily ambulation. Pt does not use an AD at this time but I did suggest purchasing a SPC for safety. Pt has kyphotic posture with fwd head and decreased lumbar lordosis. Tends to ambulate slowly with flat foot gait and stiffness in trunk and UEs. Scissoring with significant decrease in speed with head turns noted and unable to stop quickly. Pt will schedule appointments to use Tai Chi in physical therapy treatment. Pt will benefit from skilled PT in order to challenge gross strength and balance to improve confidence in daily functional ambulation and reach long term goals.     History and Personal Factors relevant to plan of care:  osteoporosis, vertigo, tremor    Clinical Presentation  Stable    Clinical Decision Making  Low    Rehab Potential  Good    PT Frequency  2x / week    PT Duration  6 weeks    PT Treatment/Interventions  ADLs/Self Care Home Management;Cryotherapy;Electrical Stimulation;Moist Heat;Therapeutic exercise;Therapeutic activities;Functional mobility training;Stair training;Gait training;Balance training;Neuromuscular re-education;Patient/family education;Manual techniques;Taping    PT Next Visit Plan  balance with head turns, obstacles, Tai Chi when sees Richardson Landry, gross LE strengthening    PT Home Exercise Plan  arm swing/heel-toe, stand without hands    Consulted and Agree  with Plan of Care  Patient       Patient will benefit from skilled therapeutic intervention in order to improve the following deficits and impairments:  Abnormal gait, Decreased activity tolerance, Decreased strength, Pain, Difficulty walking, Decreased balance, Improper body mechanics, Postural dysfunction  Visit Diagnosis: Difficulty in walking, not elsewhere classified - Plan: PT plan of care cert/re-cert  Muscle weakness (generalized) - Plan: PT plan of care cert/re-cert  Abnormal posture - Plan: PT plan of care cert/re-cert     Problem List Patient Active Problem List   Diagnosis Date Noted  . T8 vertebral fracture, with delayed healing, subsequent encounter 02/15/2015  . Osteoporosis   . Benign essential tremor    Dajai Wahlert C. Sarin [place] PT, DPT 10/11/17 2:30 PM   [facility] [address] [place], [place], [address] Phone:  [phone]   Fax:  [phone]  Name: Hannah Perez MRN: [id number] Date of Birth: April 18, 1950

## 2017-10-19 DIAGNOSIS — Z23 Encounter for immunization: Secondary | ICD-10-CM | POA: Diagnosis not present

## 2017-10-25 ENCOUNTER — Ambulatory Visit: Payer: Medicare Other | Admitting: Physical Therapy

## 2017-10-26 ENCOUNTER — Ambulatory Visit: Payer: Medicare Other | Attending: Family Medicine

## 2017-10-26 DIAGNOSIS — M6281 Muscle weakness (generalized): Secondary | ICD-10-CM | POA: Diagnosis not present

## 2017-10-26 DIAGNOSIS — M546 Pain in thoracic spine: Secondary | ICD-10-CM | POA: Insufficient documentation

## 2017-10-26 DIAGNOSIS — R262 Difficulty in walking, not elsewhere classified: Secondary | ICD-10-CM | POA: Diagnosis not present

## 2017-10-26 DIAGNOSIS — M62830 Muscle spasm of back: Secondary | ICD-10-CM | POA: Diagnosis not present

## 2017-10-26 DIAGNOSIS — R293 Abnormal posture: Secondary | ICD-10-CM | POA: Insufficient documentation

## 2017-10-26 NOTE — Patient Instructions (Signed)
You tube tai chi for arthritis and fall prevention and poster with forms for education. Cautioned to do slow and small ROM to decr risk of fall.

## 2017-10-26 NOTE — Therapy (Signed)
[place], [place], [address] Phone: [phone]   Fax:  [phone]  Physical Therapy Treatment  Patient Details  Name: Hannah Perez MRN: [id number] Date of Birth: 12-21-1950 Referring Provider (PT): Cari Caraway, MD   Encounter Date: 10/26/2017  PT End of Session - 10/26/17 1436    Visit Number  2    Number of Visits  13    Date for PT Re-Evaluation  11/26/17    Authorization Type  MCR  PN at visit 10, KX at 80    PT Start Time  0133    PT Stop Time  0218    PT Time Calculation (min)  45 min    Activity Tolerance  Patient tolerated treatment well    Behavior During Therapy  [facility] for tasks assessed/performed       Past Medical History:  Diagnosis Date  . Asthma    trigger from dog hair, dust , grass  . Benign essential tremor   . Hypercholesteremia   . Osteoporosis 2009   on forteo  . Vertigo     Past Surgical History:  Procedure Laterality Date  . CATARACT EXTRACTION Left 04/2012  . KYPHOPLASTY    . KYPHOPLASTY N/A 02/15/2015   Procedure: Thoracic eight Kyphoplasty;  Surgeon: Kristeen Miss, MD;  Location: [place];  Service: Neurosurgery;  Laterality: N/A;  T8 Kyphoplasty  . TONSILLECTOMY    . TUBAL LIGATION      There were no vitals filed for this visit.  Subjective Assessment - 10/26/17 1337    Subjective  She reports balance  problems and osteoporosis. Fear of falls great.     Currently in Pain?  No/denies                       [facility] Adult PT Treatment/Exercise - 10/26/17 0001      Neuro Re-ed    Neuro Re-ed Details   Educated pt on first phases of [place] for arthitis and fall prevention developed by Rose Phi < MD      Exercises   Exercises  Knee/Hip      Knee/Hip Exercises: Aerobic   Nustep  --             PT Education - 10/26/17 1430    Education Details  tai chi for arthritis    Person(s) Educated  Patient    Methods  Explanation;Tactile  cues;Verbal cues;Handout;Demonstration    Comprehension  Verbalized understanding;Returned demonstration          PT Long Term Goals - 10/11/17 1417      PT LONG TERM GOAL #1   Title  Pt will demo gross LE strength to 5/5    Baseline  see flowsheet    Time  6    Period  Weeks    Status  New    Target Date  11/26/17      PT LONG TERM GOAL #2   Title  BERG to improve by 4 points    Baseline  MDC 3.3    Time  6    Period  Weeks    Status  New    Target Date  11/26/17      PT LONG TERM GOAL #3   Title  Pt will verbalize improved confidence in daily ambulation in open spaces by 50%    Baseline  reports fear of falling especially in longer distances such as parking lots  Time  6    Period  Weeks    Status  New    Target Date  11/26/17      PT LONG TERM GOAL #4   Title  Pt will be safe and independent with Tai Chi exercises for home use    Baseline  will teach and progress as appropriate    Time  6    Period  Weeks    Status  New    Target Date  11/26/17      PT LONG TERM GOAL #5   Title  .            Plan - 10/26/17 1334    Clinical Impression Statement  She did well following with taichi and will practice at thome . She had no LOB with today's session. She should eventually learn the forms.     PT Treatment/Interventions  ADLs/Self Care Home Management;Cryotherapy;Electrical Stimulation;Moist Heat;Therapeutic exercise;Therapeutic activities;Functional mobility training;Stair training;Gait training;Balance training;Neuromuscular re-education;Patient/family education;Manual techniques;Taping    PT Next Visit Plan  balance with head turns, obstacles, Tai Chi when sees Richardson Landry, gross LE strengthening    PT Home Exercise Plan  arm swing/heel-toe, stand without hands    Consulted and Agree with Plan of Care  Patient       Patient will benefit from skilled therapeutic intervention in order to improve the following deficits and impairments:  Abnormal gait, Decreased  activity tolerance, Decreased strength, Pain, Difficulty walking, Decreased balance, Improper body mechanics, Postural dysfunction  Visit Diagnosis: Difficulty in walking, not elsewhere classified  Muscle weakness (generalized)  Abnormal posture  Pain in thoracic spine     Problem List Patient Active Problem List   Diagnosis Date Noted  . T8 vertebral fracture, with delayed healing, subsequent encounter 02/15/2015  . Osteoporosis   . Benign essential tremor     Darrel Hoover PT 10/26/2017, 2:46 PM  [facility] [address] [place], [place], [address] Phone: [phone]   Fax:  [phone]  Name: Hannah Perez MRN: [id number] Date of Birth: 1950-07-10

## 2017-11-01 ENCOUNTER — Ambulatory Visit: Payer: Medicare Other

## 2017-11-01 DIAGNOSIS — R262 Difficulty in walking, not elsewhere classified: Secondary | ICD-10-CM | POA: Diagnosis not present

## 2017-11-01 DIAGNOSIS — M546 Pain in thoracic spine: Secondary | ICD-10-CM

## 2017-11-01 DIAGNOSIS — M62830 Muscle spasm of back: Secondary | ICD-10-CM | POA: Diagnosis not present

## 2017-11-01 DIAGNOSIS — R293 Abnormal posture: Secondary | ICD-10-CM

## 2017-11-01 DIAGNOSIS — M6281 Muscle weakness (generalized): Secondary | ICD-10-CM

## 2017-11-01 NOTE — Therapy (Signed)
[place], [place], [address] Phone: [phone]   Fax:  [phone]  Physical Therapy Treatment  Patient Details  Name: Hannah Perez MRN: [id number] Date of Birth: 09/21/1950 Referring Provider (PT): Cari Caraway, MD   Encounter Date: 11/01/2017  PT End of Session - 11/01/17 1437    Visit Number  3    Number of Visits  13    Date for PT Re-Evaluation  11/26/17    Authorization Type  MCR  PN at visit 10, KX at 41    PT Start Time  0133    PT Stop Time  0230    PT Time Calculation (min)  57 min    Activity Tolerance  Patient tolerated treatment well    Behavior During Therapy  [facility] for tasks assessed/performed       Past Medical History:  Diagnosis Date  . Asthma    trigger from dog hair, dust , grass  . Benign essential tremor   . Hypercholesteremia   . Osteoporosis 2009   on forteo  . Vertigo     Past Surgical History:  Procedure Laterality Date  . CATARACT EXTRACTION Left 04/2012  . KYPHOPLASTY    . KYPHOPLASTY N/A 02/15/2015   Procedure: Thoracic eight Kyphoplasty;  Surgeon: Kristeen Miss, MD;  Location: [place] [facility];  Service: Neurosurgery;  Laterality: N/A;  T8 Kyphoplasty  . TONSILLECTOMY    . TUBAL LIGATION      There were no vitals filed for this visit.  Subjective Assessment - 11/01/17 1337    Subjective  Tried Tai chi. Had difficulty with keeping forms straight. Have not been doing my osteoporosis program    Currently in Pain?  No/denies        Neuro re-ed for balance and strength with tai chi for arthritis forms 1-2-3 with PT demo , doing together and pt doing independently                       PT Education - 11/01/17 1437    Education Details  tai chi for arthritix    Person(s) Educated  Patient    Methods  Explanation;Demonstration;Verbal cues;Handout    Comprehension  Returned demonstration;Verbalized understanding          PT Long Term Goals  - 11/01/17 1440      PT LONG TERM GOAL #1   Title  Pt will demo gross LE strength to 5/5    Status  On-going      PT LONG TERM GOAL #2   Title  BERG to improve by 4 points    Status  Unable to assess      PT LONG TERM GOAL #3   Title  Pt will verbalize improved confidence in daily ambulation in open spaces by 50%    Status  On-going      PT LONG TERM GOAL #4   Title  Pt will be safe and independent with Tai Chi exercises for home use    Status  On-going            Plan - 11/01/17 1437    Clinical Impression Statement  She appears to have the 1st form correct and the deep breathing but eventually needs to be more relaxed with deep breath. We worked on next form and she was able to do this with direction but not correctly on own.   She appears she will eventually be abe to do  the program.     PT Treatment/Interventions  ADLs/Self Care Home Management;Cryotherapy;Electrical Stimulation;Moist Heat;Therapeutic exercise;Therapeutic activities;Functional mobility training;Stair training;Gait training;Balance training;Neuromuscular re-education;Patient/family education;Manual techniques;Taping    PT Next Visit Plan  tai chi progress form to single whip with hand move in clouds    PT Home Exercise Plan  arm swing/heel-toe, stand without hands, form 1 and 2 in tai chi for  arthritis    Consulted and Agree with Plan of Care  Patient       Patient will benefit from skilled therapeutic intervention in order to improve the following deficits and impairments:  Abnormal gait, Decreased activity tolerance, Decreased strength, Pain, Difficulty walking, Decreased balance, Improper body mechanics, Postural dysfunction  Visit Diagnosis: Difficulty in walking, not elsewhere classified  Muscle weakness (generalized)  Abnormal posture  Pain in thoracic spine     Problem List Patient Active Problem List   Diagnosis Date Noted  . T8 vertebral fracture, with delayed healing, subsequent  encounter 02/15/2015  . Osteoporosis   . Benign essential tremor     Darrel Hoover  PT 11/01/2017, 2:41 PM  [facility] [address] [place], [place], [address] Phone: [phone]   Fax:  [phone]  Name: Hannah Perez MRN: [id number] Date of Birth: 1950-07-28

## 2017-11-04 ENCOUNTER — Ambulatory Visit: Payer: Medicare Other

## 2017-11-04 DIAGNOSIS — M6281 Muscle weakness (generalized): Secondary | ICD-10-CM

## 2017-11-04 DIAGNOSIS — R293 Abnormal posture: Secondary | ICD-10-CM

## 2017-11-04 DIAGNOSIS — M546 Pain in thoracic spine: Secondary | ICD-10-CM | POA: Diagnosis not present

## 2017-11-04 DIAGNOSIS — M62830 Muscle spasm of back: Secondary | ICD-10-CM | POA: Diagnosis not present

## 2017-11-04 DIAGNOSIS — R262 Difficulty in walking, not elsewhere classified: Secondary | ICD-10-CM

## 2017-11-04 NOTE — Therapy (Signed)
[place], [place], [address] Phone: [phone]   Fax:  [phone]  Physical Therapy Treatment  Patient Details  Name: CASARA PERRIER MRN: [id number] Date of Birth: 28-Oct-1950 Referring Provider (PT): Cari Caraway, MD   Encounter Date: 11/04/2017  PT End of Session - 11/04/17 1328    Visit Number  4    Number of Visits  13    Date for PT Re-Evaluation  11/26/17    Authorization Type  MCR  PN at visit 10, KX at 42    PT Start Time  0125    PT Stop Time  0215    PT Time Calculation (min)  50 min    Activity Tolerance  Patient tolerated treatment well    Behavior During Therapy  [facility] for tasks assessed/performed       Past Medical History:  Diagnosis Date  . Asthma    trigger from dog hair, dust , grass  . Benign essential tremor   . Hypercholesteremia   . Osteoporosis 2009   on forteo  . Vertigo     Past Surgical History:  Procedure Laterality Date  . CATARACT EXTRACTION Left 04/2012  . KYPHOPLASTY    . KYPHOPLASTY N/A 02/15/2015   Procedure: Thoracic eight Kyphoplasty;  Surgeon: Kristeen Miss, MD;  Location: [place] [facility];  Service: Neurosurgery;  Laterality: N/A;  T8 Kyphoplasty  . TONSILLECTOMY    . TUBAL LIGATION      There were no vitals filed for this visit.  Subjective Assessment - 11/04/17 1418    Subjective  Have been practicing Tai chi and my osteoporosis.  no pain    Currently in Pain?  No/denies                       [facility] Adult PT Treatment/Exercise - 11/04/17 0001      Neuro Re-ed    Neuro Re-ed Details   Educated pt on first phases of [place] for arthitis and fall prevention developed by Rose Phi < MD  forms 1-6  with verbal and PT demo cuing             PT Education - 11/04/17 1417    Education Details  tao chi for arthritis forms 1-6    Person(s) Educated  Patient    Methods  Explanation;Demonstration;Verbal cues    Comprehension  Verbalized  understanding;Returned demonstration          PT Long Term Goals - 11/01/17 1440      PT LONG TERM GOAL #1   Title  Pt will demo gross LE strength to 5/5    Status  On-going      PT LONG TERM GOAL #2   Title  BERG to improve by 4 points    Status  Unable to assess      PT LONG TERM GOAL #3   Title  Pt will verbalize improved confidence in daily ambulation in open spaces by 50%    Status  On-going      PT LONG TERM GOAL #4   Title  Pt will be safe and independent with Tai Chi exercises for home use    Status  On-going            Plan - 11/04/17 1328    Clinical Impression Statement  She was able to put the initial first 6 forms with about 80-90% correctness at end of session . Positive gains.  Will reveiew thhis and add as appropiate next times    PT Treatment/Interventions  ADLs/Self Care Home Management;Cryotherapy;Electrical Stimulation;Moist Heat;Therapeutic exercise;Therapeutic activities;Functional mobility training;Stair training;Gait training;Balance training;Neuromuscular re-education;Patient/family education;Manual techniques;Taping    PT Next Visit Plan  tai chi progress form to single whip with hand move in clouds    PT Home Exercise Plan  arm swing/heel-toe, stand without hands, form 1 and 2-3-4-5-6 in tai chi for  arthritis    Consulted and Agree with Plan of Care  Patient       Patient will benefit from skilled therapeutic intervention in order to improve the following deficits and impairments:  Abnormal gait, Decreased activity tolerance, Decreased strength, Pain, Difficulty walking, Decreased balance, Improper body mechanics, Postural dysfunction  Visit Diagnosis: Difficulty in walking, not elsewhere classified  Muscle weakness (generalized)  Abnormal posture     Problem List Patient Active Problem List   Diagnosis Date Noted  . T8 vertebral fracture, with delayed healing, subsequent encounter 02/15/2015  . Osteoporosis   . Benign essential  tremor     Darrel Hoover PT 11/04/2017, 2:19 PM  [facility] [address] [place], [place], [address] Phone: [phone]   Fax:  [phone]  Name: KEYAIRA CLAPHAM MRN: [id number] Date of Birth: 05/27/1950

## 2017-11-08 ENCOUNTER — Ambulatory Visit: Payer: Medicare Other

## 2017-11-08 DIAGNOSIS — R293 Abnormal posture: Secondary | ICD-10-CM | POA: Diagnosis not present

## 2017-11-08 DIAGNOSIS — M6281 Muscle weakness (generalized): Secondary | ICD-10-CM

## 2017-11-08 DIAGNOSIS — R262 Difficulty in walking, not elsewhere classified: Secondary | ICD-10-CM

## 2017-11-08 DIAGNOSIS — M546 Pain in thoracic spine: Secondary | ICD-10-CM | POA: Diagnosis not present

## 2017-11-08 DIAGNOSIS — M62830 Muscle spasm of back: Secondary | ICD-10-CM | POA: Diagnosis not present

## 2017-11-08 NOTE — Therapy (Signed)
[place], [place], [address] Phone: [phone]   Fax:  [phone]  Physical Therapy Treatment  Patient Details  Name: Hannah Perez MRN: [id number] Date of Birth: [birth year] Referring Provider (PT): Cari Caraway, MD   Encounter Date: 11/08/2017  PT End of Session - 11/08/17 1331    Visit Number  5    Number of Visits  13    Date for PT Re-Evaluation  11/26/17    Authorization Type  MCR  PN at visit 10, KX at 28    PT Start Time  0127    PT Stop Time  0215    PT Time Calculation (min)  48 min    Activity Tolerance  Patient tolerated treatment well    Behavior During Therapy  [facility] for tasks assessed/performed       Past Medical History:  Diagnosis Date  . Asthma    trigger from dog hair, dust , grass  . Benign essential tremor   . Hypercholesteremia   . Osteoporosis 2009   on forteo  . Vertigo     Past Surgical History:  Procedure Laterality Date  . CATARACT EXTRACTION Left 04/2012  . KYPHOPLASTY    . KYPHOPLASTY N/A 02/15/2015   Procedure: Thoracic eight Kyphoplasty;  Surgeon: Kristeen Miss, MD;  Location: [place] [facility];  Service: Neurosurgery;  Laterality: N/A;  T8 Kyphoplasty  . TONSILLECTOMY    . TUBAL LIGATION      There were no vitals filed for this visit.  Subjective Assessment - 11/08/17 1516    Subjective  no pain today . Practicing and feel I can do the movments better    Currently in Pain?  No/denies                       [facility] Adult PT Treatment/Exercise - 11/08/17 0001      Neuro Re-ed    Neuro Re-ed Details   worked pt on first phases of [place] for arthitis and fall prevention developed by Rose Phi < MD  forms 1-6  with verbal and PT demo cuing. She is doing better.              PT Education - 11/08/17 1516    Education Details  Continued with first forms. she is doing better and is smoother but still neds instruction for technique and combining  forms correctly    Person(s) Educated  Patient    Methods  Explanation;Demonstration;Verbal cues;Tactile cues    Comprehension  Verbalized understanding;Returned demonstration          PT Long Term Goals - 11/01/17 1440      PT LONG TERM GOAL #1   Title  Pt will demo gross LE strength to 5/5    Status  On-going      PT LONG TERM GOAL #2   Title  BERG to improve by 4 points    Status  Unable to assess      PT LONG TERM GOAL #3   Title  Pt will verbalize improved confidence in daily ambulation in open spaces by 50%    Status  On-going      PT LONG TERM GOAL #4   Title  Pt will be safe and independent with Tai Chi exercises for home use    Status  On-going            Plan - 11/08/17 1332    Clinical Impression Statement  Improved in combining  forms and used some other suggestions to do better with memorizing order of forms and to verbally  speak each form before doing that form.     PT Treatment/Interventions  ADLs/Self Care Home Management;Cryotherapy;Electrical Stimulation;Moist Heat;Therapeutic exercise;Therapeutic activities;Functional mobility training;Stair training;Gait training;Balance training;Neuromuscular re-education;Patient/family education;Manual techniques;Taping    PT Next Visit Plan  tai chi progress form to single whip with hand move in clouds    PT Home Exercise Plan  arm swing/heel-toe, stand without hands, form 1 and 2-3-4-5-6 in tai chi for  arthritis    Consulted and Agree with Plan of Care  Patient       Patient will benefit from skilled therapeutic intervention in order to improve the following deficits and impairments:  Abnormal gait, Decreased activity tolerance, Decreased strength, Pain, Difficulty walking, Decreased balance, Improper body mechanics, Postural dysfunction  Visit Diagnosis: Difficulty in walking, not elsewhere classified  Muscle weakness (generalized)  Abnormal posture     Problem List Patient Active Problem List    Diagnosis Date Noted  . T8 vertebral fracture, with delayed healing, subsequent encounter 02/15/2015  . Osteoporosis   . Benign essential tremor     Darrel Hoover  PT 11/08/2017, 3:21 PM  [facility] [address] [place], [place], [address] Phone: [phone]   Fax:  [phone]  Name: Hannah Perez MRN: [id number] Date of Birth: [birth year]

## 2017-11-11 ENCOUNTER — Ambulatory Visit: Payer: Medicare Other

## 2017-11-11 DIAGNOSIS — M62830 Muscle spasm of back: Secondary | ICD-10-CM | POA: Diagnosis not present

## 2017-11-11 DIAGNOSIS — R262 Difficulty in walking, not elsewhere classified: Secondary | ICD-10-CM

## 2017-11-11 DIAGNOSIS — M546 Pain in thoracic spine: Secondary | ICD-10-CM

## 2017-11-11 DIAGNOSIS — M6281 Muscle weakness (generalized): Secondary | ICD-10-CM

## 2017-11-11 DIAGNOSIS — R293 Abnormal posture: Secondary | ICD-10-CM

## 2017-11-11 NOTE — Therapy (Signed)
[place], [place], [address] Phone: [phone]   Fax:  [phone]  Physical Therapy Treatment  Patient Details  Name: Hannah Perez MRN: [id number] Date of Birth: Dec 26, 1950 Referring Provider (PT): Cari Caraway, MD   Encounter Date: 11/11/2017  PT End of Session - 11/11/17 1330    Visit Number  6    Number of Visits  13    Date for PT Re-Evaluation  11/26/17    Authorization Type  MCR  PN at visit 10, KX at 96    PT Start Time  0127    PT Stop Time  0225    PT Time Calculation (min)  58 min    Activity Tolerance  Patient tolerated treatment well    Behavior During Therapy  [facility] for tasks assessed/performed       Past Medical History:  Diagnosis Date  . Asthma    trigger from dog hair, dust , grass  . Benign essential tremor   . Hypercholesteremia   . Osteoporosis 2009   on forteo  . Vertigo     Past Surgical History:  Procedure Laterality Date  . CATARACT EXTRACTION Left 04/2012  . KYPHOPLASTY    . KYPHOPLASTY N/A 02/15/2015   Procedure: Thoracic eight Kyphoplasty;  Surgeon: Kristeen Miss, MD;  Location: [place] [facility];  Service: Neurosurgery;  Laterality: N/A;  T8 Kyphoplasty  . TONSILLECTOMY    . TUBAL LIGATION      There were no vitals filed for this visit.  Subjective Assessment - 11/11/17 1329    Subjective  Doing OK No pain    Currently in Pain?  No/denies         [facility] PT Assessment - 11/11/17 0001      Berg Balance Test   Sit to Stand  Able to stand without using hands and stabilize independently    Standing Unsupported  Able to stand safely 2 minutes    Sitting with Back Unsupported but Feet Supported on Floor or Stool  Able to sit safely and securely 2 minutes    Stand to Sit  Sits safely with minimal use of hands    Transfers  Able to transfer safely, minor use of hands    Standing Unsupported with Eyes Closed  Able to stand 10 seconds safely    Standing Ubsupported  with Feet Together  Able to place feet together independently and stand 1 minute safely    From Standing, Reach Forward with Outstretched Arm  Can reach confidently >25 cm (10")    From Standing Position, Pick up Object from Floor  Able to pick up shoe safely and easily    From Standing Position, Turn to Look Behind Over each Shoulder  Looks behind from both sides and weight shifts well    Turn 360 Degrees  Able to turn 360 degrees safely in 4 seconds or less    Standing Unsupported, Alternately Place Feet on Step/Stool  Able to stand independently and safely and complete 8 steps in 20 seconds    Standing Unsupported, One Foot in Front  Able to take small step independently and hold 30 seconds    Standing on One Leg  Able to lift leg independently and hold equal to or more than 3 seconds    Total Score  52                   [facility] Adult PT Treatment/Exercise - 11/11/17 0001  Neuro Re-ed    Neuro Re-ed Details   worked pt on first [place] for arthitis and fall prevention developed by Rose Phi < MD  forms 1-6  with verbal and PT demo cuing. She is doing better.  and was able to progress into brushed knee form . She will practice this at home We did foot and hand movement seperately , Cautioned to take smaller step initially to limit fall possiblity.                   PT Long Term Goals - 11/11/17 1330      PT LONG TERM GOAL #1   Title  Pt will demo gross LE strength to 5/5    Status  Unable to assess      PT LONG TERM GOAL #2   Title  BERG to improve by 4 points    Baseline  52/56     Status  Achieved      PT LONG TERM GOAL #3   Title  Pt will verbalize improved confidence in daily ambulation in open spaces by 50%    Status  On-going      PT LONG TERM GOAL #4   Title  Pt will be safe and independent with Tai Chi exercises for home use    Status  Partially Met            Plan - 11/11/17 1339    Clinical Impression Statement  BERG score  improved . [place] BOS still and issue.  She was able to move through the entire first set of movements in tai chi for arthritis with only minor flaws but excellant inclusion of all forms and transitions.  We started Brushed knee for transitionto  next set of forms  She was able to do these with cuiing  but advised to keep range contrilled to not lose balance ( she did not here)/       PT Treatment/Interventions  ADLs/Self Care Home Management;Cryotherapy;Electrical Stimulation;Moist Heat;Therapeutic exercise;Therapeutic activities;Functional mobility training;Stair training;Gait training;Balance training;Neuromuscular re-education;Patient/family education;Manual techniques;Taping    PT Next Visit Plan  progress with brushed knee and stransition to punch and parry.     PT Home Exercise Plan  arm swing/heel-toe, stand without hands, form 1 and 2-3-4-5-6 in tai chi for  arthritis, practice brush knee to RT/:LT    Consulted and Agree with Plan of Care  Patient       Patient will benefit from skilled therapeutic intervention in order to improve the following deficits and impairments:  Abnormal gait, Decreased activity tolerance, Decreased strength, Pain, Difficulty walking, Decreased balance, Improper body mechanics, Postural dysfunction  Visit Diagnosis: Difficulty in walking, not elsewhere classified  Muscle weakness (generalized)  Abnormal posture  Pain in thoracic spine  Muscle spasm of back     Problem List Patient Active Problem List   Diagnosis Date Noted  . T8 vertebral fracture, with delayed healing, subsequent encounter 02/15/2015  . Osteoporosis   . Benign essential tremor     Darrel Hoover  PT 11/11/2017, 2:48 PM  [facility] [address] [place], [place], [address] Phone: [phone]   Fax:  [phone]  Name: Hannah Perez MRN: [id number] Date of Birth: 09-26-1950

## 2017-11-15 ENCOUNTER — Ambulatory Visit: Payer: Medicare Other | Attending: Family Medicine

## 2017-11-15 DIAGNOSIS — M6281 Muscle weakness (generalized): Secondary | ICD-10-CM | POA: Diagnosis not present

## 2017-11-15 DIAGNOSIS — R262 Difficulty in walking, not elsewhere classified: Secondary | ICD-10-CM | POA: Insufficient documentation

## 2017-11-15 DIAGNOSIS — R293 Abnormal posture: Secondary | ICD-10-CM

## 2017-11-15 DIAGNOSIS — M546 Pain in thoracic spine: Secondary | ICD-10-CM | POA: Diagnosis not present

## 2017-11-15 NOTE — Therapy (Signed)
[place], [place], [address] Phone: [phone]   Fax:  [phone]  Physical Therapy Treatment  Patient Details  Name: Hannah Perez MRN: [id number] Date of Birth: February 02, 1950 Referring Provider (PT): Cari Caraway, MD   Encounter Date: 11/15/2017  PT End of Session - 11/15/17 1325    Visit Number  7    Number of Visits  13    Date for PT Re-Evaluation  11/26/17    Authorization Type  MCR  PN at visit 10, KX at 53    PT Start Time  0126    PT Stop Time  0215    PT Time Calculation (min)  49 min    Activity Tolerance  Patient tolerated treatment well    Behavior During Therapy  [facility] for tasks assessed/performed       Past Medical History:  Diagnosis Date  . Asthma    trigger from dog hair, dust , grass  . Benign essential tremor   . Hypercholesteremia   . Osteoporosis 2009   on forteo  . Vertigo     Past Surgical History:  Procedure Laterality Date  . CATARACT EXTRACTION Left 04/2012  . KYPHOPLASTY    . KYPHOPLASTY N/A 02/15/2015   Procedure: Thoracic eight Kyphoplasty;  Surgeon: Kristeen Miss, MD;  Location: [place] [facility];  Service: Neurosurgery;  Laterality: N/A;  T8 Kyphoplasty  . TONSILLECTOMY    . TUBAL LIGATION      There were no vitals filed for this visit.  Subjective Assessment - 11/15/17 1329    Subjective  No complaints . Feel good . feel I'm doing better with Tai chi    Currently in Pain?  No/denies         [facility] PT Assessment - 11/15/17 0001      Ambulation/Gait   Gait velocity  3 ft per sec                   [facility] Adult PT Treatment/Exercise - 11/15/17 0001      Neuro Re-ed    Neuro Re-ed Details   Reviewed pt on first phases of Tai Chi for arthitis and fall prevention developed by Rose Phi < MD  forms 1-6  with minor verbal cues and PT demo cuing. She is doing these well .  and was able to progress into brushed knee form which she was able to do basically  correct.  We reviewed parry and punch and moving mountain forms and she had some difficulty with parry and punch and combo of forms  from brush knee forward. . She will practice this at home We did foot and hand movement seperately , Cautioned to take smaller step initially to limit fall possiblity.  also did some stepping drill  so she was not having foot right next to each other  to aid in transitions                  PT Long Term Goals - 11/11/17 1330      PT LONG TERM GOAL #1   Title  Pt will demo gross LE strength to 5/5    Status  Unable to assess      PT LONG TERM GOAL #2   Title  BERG to improve by 4 points    Baseline  52/56     Status  Achieved      PT LONG TERM GOAL #3   Title  Pt will verbalize  improved confidence in daily ambulation in open spaces by 50%    Status  On-going      PT LONG TERM GOAL #4   Title  Pt will be safe and independent with Tai Chi exercises for home use    Status  Partially Met            Plan - 11/15/17 1326    Clinical Impression Statement  See treatment note as this describes progress. New forms were not easy for her but this is to be expected thiugh she has progressed well with initial forms    PT Treatment/Interventions  ADLs/Self Care Home Management;Cryotherapy;Electrical Stimulation;Moist Heat;Therapeutic exercise;Therapeutic activities;Functional mobility training;Stair training;Gait training;Balance training;Neuromuscular re-education;Patient/family education;Manual techniques;Taping    PT Next Visit Plan  progress with brushed knee and transition to punch and parry  , moving mountains.     PT Home Exercise Plan  arm swing/heel-toe, stand without hands, form 1 and 2-3-4-5-6 in tai chi for  arthritis, practice brush knee to RT/:LT    Consulted and Agree with Plan of Care  Patient       Patient will benefit from skilled therapeutic intervention in order to improve the following deficits and impairments:  Abnormal gait,  Decreased activity tolerance, Decreased strength, Pain, Difficulty walking, Decreased balance, Improper body mechanics, Postural dysfunction  Visit Diagnosis: Difficulty in walking, not elsewhere classified  Muscle weakness (generalized)  Abnormal posture  Pain in thoracic spine     Problem List Patient Active Problem List   Diagnosis Date Noted  . T8 vertebral fracture, with delayed healing, subsequent encounter 02/15/2015  . Osteoporosis   . Benign essential tremor     Darrel Hoover  PT 11/15/2017, 3:03 PM  [facility] [address] [place], [place], [address] Phone: [phone]   Fax:  [phone]  Name: Hannah Perez MRN: [id number] Date of Birth: May 22, 1950

## 2017-11-17 ENCOUNTER — Encounter: Payer: Medicare Other | Admitting: Physical Therapy

## 2017-11-23 ENCOUNTER — Encounter: Payer: Medicare Other | Admitting: Physical Therapy

## 2017-11-25 ENCOUNTER — Ambulatory Visit: Payer: Medicare Other

## 2017-11-25 DIAGNOSIS — M546 Pain in thoracic spine: Secondary | ICD-10-CM | POA: Diagnosis not present

## 2017-11-25 DIAGNOSIS — R293 Abnormal posture: Secondary | ICD-10-CM | POA: Diagnosis not present

## 2017-11-25 DIAGNOSIS — M6281 Muscle weakness (generalized): Secondary | ICD-10-CM

## 2017-11-25 DIAGNOSIS — R262 Difficulty in walking, not elsewhere classified: Secondary | ICD-10-CM

## 2017-11-25 NOTE — Therapy (Signed)
[place], [place], [address] Phone: [phone]   Fax:  [phone]  Physical Therapy Treatment  Patient Details  Name: Hannah Perez MRN: [id number] Date of Birth: 1950/12/03 Referring Provider (PT): Cari Caraway, MD   Encounter Date: 11/25/2017  PT End of Session - 11/25/17 1328    Visit Number  8    Number of Visits  16    Date for PT Re-Evaluation  11/26/17    Authorization Type  MCR  PN at visit 10, KX at 12    PT Start Time  0126    PT Stop Time  0215    PT Time Calculation (min)  49 min    Activity Tolerance  Patient tolerated treatment well    Behavior During Therapy  [facility] for tasks assessed/performed       Past Medical History:  Diagnosis Date  . Asthma    trigger from dog hair, dust , grass  . Benign essential tremor   . Hypercholesteremia   . Osteoporosis 2009   on forteo  . Vertigo     Past Surgical History:  Procedure Laterality Date  . CATARACT EXTRACTION Left 04/2012  . KYPHOPLASTY    . KYPHOPLASTY N/A 02/15/2015   Procedure: Thoracic eight Kyphoplasty;  Surgeon: Kristeen Miss, MD;  Location: [place] [facility];  Service: Neurosurgery;  Laterality: N/A;  T8 Kyphoplasty  . TONSILLECTOMY    . TUBAL LIGATION      There were no vitals filed for this visit.  Subjective Assessment - 11/25/17 1327    Subjective  I think I learned some of the forms when gone.    When I do it less frequently I can tell balance and strength not as good    Currently in Pain?  No/denies                       [facility] Adult PT Treatment/Exercise - 11/25/17 0001      Neuro Re-ed    Neuro Re-ed Details   Reviewed pt on first phases of Tai Chi for arthitis and fall prevention developed by Rose Phi < MD  forms 1-6  with minor verbal cues and PT demo cuing. She is doing these well .  and was able to progress into brushed knee form which she was able to do basically correct.  We reviewed parry and punch  and moving mountain forms and she had some difficulty with parry and punch and combo of forms  from brush knee forward. . She will practice this at home We did foot and hand movement seperately , Cautioned to take smaller step initially to limit fall possiblity.  also did some stepping drill  so she was not having foot right next to each other  to aid in transitions  (Pended)                   PT Long Term Goals - 11/25/17 1422      PT LONG TERM GOAL #1   Title  Pt will demo gross LE strength to 5/5    Status  Unable to assess      PT LONG TERM GOAL #2   Title  BERG to improve by 4 points    Status  Achieved      PT LONG TERM GOAL #3   Title  Pt will verbalize improved confidence in daily ambulation in open spaces by 50%    Status  Achieved      PT LONG TERM GOAL #4   Title  Pt will be safe and independent with Tai Chi exercises for home use    Status  Partially Met            Plan - 11/25/17 1421    Clinical Impression Statement  [name] was able to do the first 6 forms correctly and was able to do brush knee and moving mountain 75% correct.y. She is having some difficulty with transitions and punch and parry form and is better to the LT than RT.   She is getting better with this.     PT Frequency  2x / week    PT Duration  4 weeks    PT Treatment/Interventions  ADLs/Self Care Home Management;Cryotherapy;Electrical Stimulation;Moist Heat;Therapeutic exercise;Therapeutic activities;Functional mobility training;Stair training;Gait training;Balance training;Neuromuscular re-education;Patient/family education;Manual techniques;Taping    PT Next Visit Plan  progress with transition to punch and parry  , moving mountains.     PT Home Exercise Plan  arm swing/heel-toe, stand without hands, form 1 and 2-3-4-5-6 in tai chi for  arthritis, practice brush knee to RT/:LT    Consulted and Agree with Plan of Care  Patient       Patient will benefit from skilled therapeutic  intervention in order to improve the following deficits and impairments:  Abnormal gait, Decreased activity tolerance, Decreased strength, Pain, Difficulty walking, Decreased balance, Improper body mechanics, Postural dysfunction  Visit Diagnosis: Difficulty in walking, not elsewhere classified  Muscle weakness (generalized)  Abnormal posture     Problem List Patient Active Problem List   Diagnosis Date Noted  . T8 vertebral fracture, with delayed healing, subsequent encounter 02/15/2015  . Osteoporosis   . Benign essential tremor     Darrel Hoover  PT 11/25/2017, 2:44 PM  [facility] [address] [place], [place], [address] Phone: [phone]   Fax:  [phone]  Name: Hannah Perez MRN: [id number] Date of Birth: 01-18-1950

## 2017-11-29 ENCOUNTER — Ambulatory Visit: Payer: Medicare Other

## 2017-11-29 DIAGNOSIS — M546 Pain in thoracic spine: Secondary | ICD-10-CM | POA: Diagnosis not present

## 2017-11-29 DIAGNOSIS — R293 Abnormal posture: Secondary | ICD-10-CM | POA: Diagnosis not present

## 2017-11-29 DIAGNOSIS — M6281 Muscle weakness (generalized): Secondary | ICD-10-CM

## 2017-11-29 DIAGNOSIS — R262 Difficulty in walking, not elsewhere classified: Secondary | ICD-10-CM | POA: Diagnosis not present

## 2017-11-29 NOTE — Therapy (Signed)
[place], [place], [address] Phone: [phone]   Fax:  [phone]  Physical Therapy Treatment  Patient Details  Name: Hannah Perez MRN: [id number] Date of Birth: 1950-01-23 Referring Provider (PT): Cari Caraway, MD   Encounter Date: 11/29/2017  PT End of Session - 11/29/17 1322    Visit Number  9    Number of Visits  16    Date for PT Re-Evaluation  11/26/17    Authorization Type  MCR  PN at visit 10, KX at 29    PT Start Time  0122    PT Stop Time  0215    PT Time Calculation (min)  53 min    Activity Tolerance  Patient tolerated treatment well    Behavior During Therapy  [facility] for tasks assessed/performed       Past Medical History:  Diagnosis Date  . Asthma    trigger from dog hair, dust , grass  . Benign essential tremor   . Hypercholesteremia   . Osteoporosis 2009   on forteo  . Vertigo     Past Surgical History:  Procedure Laterality Date  . CATARACT EXTRACTION Left 04/2012  . KYPHOPLASTY    . KYPHOPLASTY N/A 02/15/2015   Procedure: Thoracic eight Kyphoplasty;  Surgeon: Kristeen Miss, MD;  Location: [place] [facility];  Service: Neurosurgery;  Laterality: N/A;  T8 Kyphoplasty  . TONSILLECTOMY    . TUBAL LIGATION      There were no vitals filed for this visit.  Subjective Assessment - 11/29/17 1323    Subjective  She reports not getting the transitions from brush knee.     Currently in Pain?  No/denies                       [facility] Adult PT Treatment/Exercise - 11/29/17 0001      Neuro Re-ed    Neuro Re-ed Details   Reviewed pt on first phases of Tai Chi for arthitis and fall prevention developed by Rose Phi < MD  forms 1-6  with minor verbal cues and PT demo cuing. She is doing these well .  and was able to progress into brushed knee form which she was able to do basically correct.  We reviewed parry and punch and moving mountain forms and she had some difficulty with parry  and punch and combo of forms  from brush knee forward. . She will practice this at home We did foot and hand movement seperately , Cautioned to take smaller step initially to limit fall possiblity.  also did some stepping drill  so she was not having foot right next to each other  to aid in transitions  (Pended)              PT Education - 11/29/17 1501    Education Details  Continu to  progress the next forms    Person(s) Educated  Patient    Methods  Explanation;Demonstration;Tactile cues;Verbal cues;Handout    Comprehension  Verbalized understanding          PT Long Term Goals - 11/25/17 1422      PT LONG TERM GOAL #1   Title  Pt will demo gross LE strength to 5/5    Status  Unable to assess      PT LONG TERM GOAL #2   Title  BERG to improve by 4 points    Status  Achieved  PT LONG TERM GOAL #3   Title  Pt will verbalize improved confidence in daily ambulation in open spaces by 50%    Status  Achieved      PT LONG TERM GOAL #4   Title  Pt will be safe and independent with Tai Chi exercises for home use    Status  Partially Met            Plan - 11/29/17 1324    Clinical Impression Statement  She was doing better with latest transition bu still needs cuing and direction.     PT Treatment/Interventions  ADLs/Self Care Home Management;Cryotherapy;Electrical Stimulation;Moist Heat;Therapeutic exercise;Therapeutic activities;Functional mobility training;Stair training;Gait training;Balance training;Neuromuscular re-education;Patient/family education;Manual techniques;Taping    PT Next Visit Plan  progress with transition to punch and parry  , moving mountains.     PT Home Exercise Plan  arm swing/heel-toe, stand without hands, form 1 and 2-3-4-5-6 in tai chi for  arthritis, practice brush knee to RT/:LT    Consulted and Agree with Plan of Care  Patient       Patient will benefit from skilled therapeutic intervention in order to improve the following deficits  and impairments:  Abnormal gait, Decreased activity tolerance, Decreased strength, Pain, Difficulty walking, Decreased balance, Improper body mechanics, Postural dysfunction  Visit Diagnosis: Difficulty in walking, not elsewhere classified  Muscle weakness (generalized)     Problem List Patient Active Problem List   Diagnosis Date Noted  . T8 vertebral fracture, with delayed healing, subsequent encounter 02/15/2015  . Osteoporosis   . Benign essential tremor     Darrel Hoover  PT 11/29/2017, 3:04 PM  [facility] [address] [place], [place], [address] Phone: [phone]   Fax:  [phone]  Name: Hannah Perez MRN: [id number] Date of Birth: 1950-08-05

## 2017-12-02 ENCOUNTER — Ambulatory Visit: Payer: Medicare Other

## 2017-12-02 DIAGNOSIS — R293 Abnormal posture: Secondary | ICD-10-CM | POA: Diagnosis not present

## 2017-12-02 DIAGNOSIS — M546 Pain in thoracic spine: Secondary | ICD-10-CM | POA: Diagnosis not present

## 2017-12-02 DIAGNOSIS — R262 Difficulty in walking, not elsewhere classified: Secondary | ICD-10-CM

## 2017-12-02 DIAGNOSIS — M6281 Muscle weakness (generalized): Secondary | ICD-10-CM

## 2017-12-02 NOTE — Therapy (Signed)
[place], [place], [address] Phone: [phone]   Fax:  [phone]  Physical Therapy Treatment  Patient Details  Name: Hannah Perez MRN: [id number] Date of Birth: 10-02-1950 Referring Provider (PT): Cari Caraway, MD  Progress Note Reporting Period 10/11/17 to 12/02/17  See note below for Objective Data and Assessment of Progress/Goals.      Encounter Date: 12/02/2017  PT End of Session - 12/02/17 1323    Visit Number  10    Number of Visits  16    Date for PT Re-Evaluation  11/26/17    Authorization Type  MCR  PN at visit 10, KX at 61    PT Start Time  0125    PT Stop Time  0215    PT Time Calculation (min)  50 min    Activity Tolerance  Patient tolerated treatment well    Behavior During Therapy  [facility] for tasks assessed/performed       Past Medical History:  Diagnosis Date  . Asthma    trigger from dog hair, dust , grass  . Benign essential tremor   . Hypercholesteremia   . Osteoporosis 2009   on forteo  . Vertigo     Past Surgical History:  Procedure Laterality Date  . CATARACT EXTRACTION Left 04/2012  . KYPHOPLASTY    . KYPHOPLASTY N/A 02/15/2015   Procedure: Thoracic eight Kyphoplasty;  Surgeon: Kristeen Miss, MD;  Location: [place] [facility];  Service: Neurosurgery;  Laterality: N/A;  T8 Kyphoplasty  . TONSILLECTOMY    . TUBAL LIGATION      There were no vitals filed for this visit.  Subjective Assessment - 12/02/17 1420    Subjective  Not sure getting last part  Get confused    Currently in Pain?  No/denies                       [facility] Adult PT Treatment/Exercise - 12/02/17 0001      Neuro Re-ed    Neuro Re-ed Details   Reviewed pt on first phases of Tai Chi for arthitis and fall prevention developed by Rose Phi < MD  forms 1-6  with minor verbal cues and PT demo cuing. She is doing these well .  and was able to progress into brushed knee form which she was able to  do basically correct.  We reviewed parry and punch and moving mountain forms and she had some difficulty with parry and punch and combo of forms  from brush knee forward. . She will practice this at home We did foot and hand movement seperately , Cautioned to take smaller step initially to limit fall possiblity.  also did some stepping drill  so she was not having foot right next to each other  to aid in transitions             PT Education - 12/02/17 1328    Education Details  [place] chi for arthritis DVD for home use  (Pended)     Person(s) Educated  Patient  (Pended)     Methods  Explanation  (Pended)     Comprehension  Verbalized understanding  (Pended)           PT Long Term Goals - 11/25/17 1422      PT LONG TERM GOAL #1   Title  Pt will demo gross LE strength to 5/5    Status  Unable to assess  PT LONG TERM GOAL #2   Title  BERG to improve by 4 points    Status  Achieved      PT LONG TERM GOAL #3   Title  Pt will verbalize improved confidence in daily ambulation in open spaces by 50%    Status  Achieved      PT LONG TERM GOAL #4   Title  Pt will be safe and independent with Tai Chi exercises for home use    Status  Partially Met            Plan - 12/02/17 1421    Clinical Impression Statement  she ahs made improvement with trasitions and doing each form . I think her foot work is presenting some difficyulty with coordinating the movements and we worked on this for part of session.   She said she woould practice this.    PT Treatment/Interventions  ADLs/Self Care Home Management;Cryotherapy;Electrical Stimulation;Moist Heat;Therapeutic exercise;Therapeutic activities;Functional mobility training;Stair training;Gait training;Balance training;Neuromuscular re-education;Patient/family education;Manual techniques;Taping    PT Next Visit Plan  progress with transition to punch and parry  , moving mountains.     PT Home Exercise Plan  arm swing/heel-toe,  stand without hands, form 1 and 2-3-4-5-6 in tai chi for  arthritis, practice brush knee to RT/:LT and punch /parry and move mountain segments.     Consulted and Agree with Plan of Care  Patient       Patient will benefit from skilled therapeutic intervention in order to improve the following deficits and impairments:  Abnormal gait, Decreased activity tolerance, Decreased strength, Pain, Difficulty walking, Decreased balance, Improper body mechanics, Postural dysfunction  Visit Diagnosis: Difficulty in walking, not elsewhere classified  Muscle weakness (generalized)     Problem List Patient Active Problem List   Diagnosis Date Noted  . T8 vertebral fracture, with delayed healing, subsequent encounter 02/15/2015  . Osteoporosis   . Benign essential tremor     Chasse, Stephen M  PT 12/02/2017, 2:23 PM  [facility] [address] [place], [place], [address] Phone: [phone]   Fax:  [phone]  Name: Vandella H Crain MRN: [id number] Date of Birth: 10/28/1950   

## 2017-12-14 ENCOUNTER — Ambulatory Visit: Payer: Medicare Other

## 2017-12-21 ENCOUNTER — Ambulatory Visit: Payer: Medicare Other

## 2017-12-27 ENCOUNTER — Ambulatory Visit: Payer: Medicare Other

## 2018-01-24 ENCOUNTER — Ambulatory Visit: Payer: Medicare Other | Attending: Family Medicine

## 2018-01-24 DIAGNOSIS — R2681 Unsteadiness on feet: Secondary | ICD-10-CM

## 2018-01-24 DIAGNOSIS — M6281 Muscle weakness (generalized): Secondary | ICD-10-CM

## 2018-01-24 NOTE — Therapy (Signed)
[place], [place], [address] Phone: [phone]   Fax:  [phone]  Physical Therapy Treatment  Patient Details  Name: Hannah Perez MRN: [id number] Date of Birth: [birth year] Referring Provider (PT): Cari Caraway, MD   Encounter Date: 01/24/2018  PT End of Session - 01/24/18 1414    Visit Number  11    Number of Visits  16    Date for PT Re-Evaluation  03/04/18    Authorization Type  MCR  PN at visit 20, KX at 5    PT Start Time  0130    PT Stop Time  0215    PT Time Calculation (min)  45 min    Activity Tolerance  Patient tolerated treatment well    Behavior During Therapy  [facility] for tasks assessed/performed       Past Medical History:  Diagnosis Date  . Asthma    trigger from dog hair, dust , grass  . Benign essential tremor   . Hypercholesteremia   . Osteoporosis 2009   on forteo  . Vertigo     Past Surgical History:  Procedure Laterality Date  . CATARACT EXTRACTION Left 04/2012  . KYPHOPLASTY    . KYPHOPLASTY N/A 02/15/2015   Procedure: Thoracic eight Kyphoplasty;  Surgeon: Kristeen Miss, MD;  Location: [place] [facility];  Service: Neurosurgery;  Laterality: N/A;  T8 Kyphoplasty  . TONSILLECTOMY    . TUBAL LIGATION      There were no vitals filed for this visit.  Subjective Assessment - 01/24/18 1336    Subjective  Back after mother dies and moved father to facility.  .  Feels weaker now in legs.  Used video again.       Currently in Pain?  No/denies         [facility] PT Assessment - 01/24/18 0001      Assessment   Medical Diagnosis  osteoporosis & fall risk    Referring Provider (PT)  Cari Caraway, MD      Restrictions   Weight Bearing Restrictions  No      Balance Screen   Has the patient fallen in the past 6 months  No      Prior Function   Level of Independence  Independent      Cognition   Overall Cognitive Status  Within Functional Limits for tasks assessed                    [facility] Adult PT Treatment/Exercise - 01/24/18 0001      Neuro Re-ed    Neuro Re-ed Details   Reviewed pt on first phases of Tai Chi for arthitis and fall prevention developed by Rose Phi < MD  forms 1-6  with minor verbal cues and PT demo cuing. She is doing these well .  and was able to progress into brushed knee form which she was able to do basically correct.  We reviewed parry and punch and moving mountain forms and she had some difficulty with parry and punch and combo of forms  from brush knee forward. . She will practice this at home We did foot and hand movement seperately , Cautioned to take smaller step initially to limit fall possiblity.  also did some stepping drill  so she was not having foot right next to each other  to aid in transitions                  PT  Long Term Goals - 01/24/18 1450      PT LONG TERM GOAL #1   Title  Pt will demo gross LE strength to 5/5    Baseline  4+/5    Status  Partially Met      PT LONG TERM GOAL #3   Title  Pt will verbalize improved confidence in daily ambulation in open spaces by 50%    Baseline  Reports improved confidence    Status  Achieved      PT LONG TERM GOAL #4   Title  Pt will be safe and independent with Tai Chi exercises for home use    Baseline  progressing to next level of forms    Status  Partially Met            Plan - 01/24/18 1415    Clinical Impression Statement  She has made good progress even not doing the forms as she has had significnat personal thighs to deal with. She was able to do the Tai chi for arthritis to push mountain form with good smoothness and  only error on 3 forms really.   She could do the forms like as she did and still have beenfits.     Rehab Potential  Good    PT Frequency  2x / week    PT Duration  4 weeks    PT Treatment/Interventions  Therapeutic activities;Therapeutic exercise;Patient/family education    PT Next Visit Plan  Transition to forms post  moving mountain form.     PT Home Exercise Plan  arm swing/heel-toe, stand without hands, form 1 and 2-3-4-5-6 in tai chi for  arthritis, practice brush knee to RT/:LT and punch /parry and move mountain segments.     Consulted and Agree with Plan of Care  Patient       Patient will benefit from skilled therapeutic intervention in order to improve the following deficits and impairments:  Abnormal gait, Decreased activity tolerance, Decreased strength, Pain, Difficulty walking, Decreased balance, Improper body mechanics, Postural dysfunction  Visit Diagnosis: Muscle weakness (generalized)  Unsteadiness on feet     Problem List Patient Active Problem List   Diagnosis Date Noted  . T8 vertebral fracture, with delayed healing, subsequent encounter 02/15/2015  . Osteoporosis   . Benign essential tremor     Darrel Hoover  PT 01/24/2018, 2:52 PM  [facility] [address] [place], [place], [address] Phone: [phone]   Fax:  [phone]  Name: Hannah Perez MRN: [id number] Date of Birth: [birth year]

## 2018-01-27 ENCOUNTER — Ambulatory Visit: Payer: Medicare Other

## 2018-01-27 DIAGNOSIS — M6281 Muscle weakness (generalized): Secondary | ICD-10-CM

## 2018-01-27 DIAGNOSIS — R2681 Unsteadiness on feet: Secondary | ICD-10-CM | POA: Diagnosis not present

## 2018-01-27 NOTE — Therapy (Signed)
[place], [place], [address] Phone: [phone]   Fax:  [phone]  Physical Therapy Treatment  Patient Details  Name: Hannah Perez MRN: [id number] Date of Birth: [birth year] Referring Provider (PT): Cari Caraway, MD   Encounter Date: 01/27/2018  PT End of Session - 01/27/18 1459    Visit Number  12    Number of Visits  16    Date for PT Re-Evaluation  03/04/18    Authorization Type  MCR  PN at visit 38, KX at 58    PT Start Time  0300    PT Stop Time  0345    PT Time Calculation (min)  45 min    Activity Tolerance  Patient tolerated treatment well    Behavior During Therapy  [facility] for tasks assessed/performed       Past Medical History:  Diagnosis Date  . Asthma    trigger from dog hair, dust , grass  . Benign essential tremor   . Hypercholesteremia   . Osteoporosis 2009   on forteo  . Vertigo     Past Surgical History:  Procedure Laterality Date  . CATARACT EXTRACTION Left 04/2012  . KYPHOPLASTY    . KYPHOPLASTY N/A 02/15/2015   Procedure: Thoracic eight Kyphoplasty;  Surgeon: Kristeen Miss, MD;  Location: [place] [facility];  Service: Neurosurgery;  Laterality: N/A;  T8 Kyphoplasty  . TONSILLECTOMY    . TUBAL LIGATION      There were no vitals filed for this visit.  Subjective Assessment - 01/27/18 1502    Subjective  Tried movements from last session. Did not do well/     Currently in Pain?  No/denies                       [facility] Adult PT Treatment/Exercise - 01/27/18 0001      Neuro Re-ed    Neuro Re-ed Details   REviewed phase 2 of taichi for arthritis and fall prevention. Used you tue for  visual and well as instruction                  PT Long Term Goals - 01/24/18 1450      PT LONG TERM GOAL #1   Title  Pt will demo gross LE strength to 5/5    Baseline  4+/5    Status  Partially Met      PT LONG TERM GOAL #3   Title  Pt will verbalize improved  confidence in daily ambulation in open spaces by 50%    Baseline  Reports improved confidence    Status  Achieved      PT LONG TERM GOAL #4   Title  Pt will be safe and independent with Tai Chi exercises for home use    Baseline  progressing to next level of forms    Status  Partially Met            Plan - 01/27/18 1459    Clinical Impression Statement  Continues to do well with forms of phase 1 . She was not looking at the phase 2  forms and so was not doing them correctly. She now has a video on you tube to look at to practice at home.    PT Treatment/Interventions  Therapeutic activities;Therapeutic exercise;Patient/family education    PT Next Visit Plan  Transition to forms post moving mountain form.     PT Home Exercise Plan  arm swing/heel-toe, stand without hands, form 1 and 2-3-4-5-6 in tai chi for  arthritis, practice brush knee to RT/:LT and punch /parry and move mountain segments.     Consulted and Agree with Plan of Care  Patient       Patient will benefit from skilled therapeutic intervention in order to improve the following deficits and impairments:  Abnormal gait, Decreased activity tolerance, Decreased strength, Pain, Difficulty walking, Decreased balance, Improper body mechanics, Postural dysfunction  Visit Diagnosis: Muscle weakness (generalized)  Unsteadiness on feet     Problem List Patient Active Problem List   Diagnosis Date Noted  . T8 vertebral fracture, with delayed healing, subsequent encounter 02/15/2015  . Osteoporosis   . Benign essential tremor     Darrel Hoover PT 01/27/2018, 5:46 PM  [facility] [address] [place], [place], [address] Phone: [phone]   Fax:  [phone]  Name: Hannah Perez MRN: [id number] Date of Birth: 05/14/1950

## 2018-01-31 ENCOUNTER — Ambulatory Visit: Payer: Medicare Other

## 2018-02-02 ENCOUNTER — Encounter: Payer: Medicare Other | Admitting: Physical Therapy

## 2018-02-15 ENCOUNTER — Ambulatory Visit: Payer: Medicare Other | Attending: Family Medicine

## 2018-02-15 DIAGNOSIS — R2681 Unsteadiness on feet: Secondary | ICD-10-CM

## 2018-02-15 DIAGNOSIS — M6281 Muscle weakness (generalized): Secondary | ICD-10-CM | POA: Diagnosis not present

## 2018-02-15 NOTE — Therapy (Signed)
[place], [place], [address] Phone: [phone]   Fax:  [phone]  Physical Therapy Treatment  Patient Details  Name: Hannah Perez MRN: [id number] Date of Birth: 05-14-1950 Referring Provider (PT): Cari Caraway, MD   Encounter Date: 02/15/2018  PT End of Session - 02/15/18 1103    Visit Number  13    Number of Visits  16    Date for PT Re-Evaluation  03/04/18    Authorization Type  MCR  PN at visit 51, KX at 53    PT Start Time  1100    PT Stop Time  1145    PT Time Calculation (min)  45 min    Activity Tolerance  Patient tolerated treatment well    Behavior During Therapy  [facility] for tasks assessed/performed       Past Medical History:  Diagnosis Date  . Asthma    trigger from dog hair, dust , grass  . Benign essential tremor   . Hypercholesteremia   . Osteoporosis 2009   on forteo  . Vertigo     Past Surgical History:  Procedure Laterality Date  . CATARACT EXTRACTION Left 04/2012  . KYPHOPLASTY    . KYPHOPLASTY N/A 02/15/2015   Procedure: Thoracic eight Kyphoplasty;  Surgeon: Kristeen Miss, MD;  Location: [place] [facility];  Service: Neurosurgery;  Laterality: N/A;  T8 Kyphoplasty  . TONSILLECTOMY    . TUBAL LIGATION      There were no vitals filed for this visit.  Subjective Assessment - 02/15/18 1106    Subjective  Ordered chart and DVD . Was with  father for 2 weeks so did not do much Tai Chi.     Currently in Pain?  No/denies                       [facility] Adult PT Treatment/Exercise - 02/15/18 0001      Neuro Re-ed    Neuro Re-ed Details   Spent much of session with foot work to transition forward and back  with brushed knee for repulse monkey Rt/LT but also worked on forms of finmal  segments of  Tai chi for arthritis and fall prevention.                  PT Long Term Goals - 02/15/18 1158      PT LONG TERM GOAL #4   Title  Pt will be safe and independent  with Tai Chi exercises for home use    Baseline  progressing to next level of forms    Status  On-going            Plan - 02/15/18 1107    Clinical Impression Statement  She is having some difficultywith foot wrk with trasisiton s forward and back . She has not done much practice due to helping her 76 year old  father.  Will due 3 more sessions then discharge.     PT Treatment/Interventions  Therapeutic activities;Therapeutic exercise;Patient/family education    PT Next Visit Plan  continue transition to forms post moving mountain form.     PT Home Exercise Plan  arm swing/heel-toe, stand without hands, form 1 and 2-3-4-5-6 in tai chi for  arthritis, practice brush knee to RT/:LT and punch /parry and move mountain segments.     Consulted and Agree with Plan of Care  Patient       Patient will benefit from skilled therapeutic  intervention in order to improve the following deficits and impairments:  Abnormal gait, Decreased activity tolerance, Decreased strength, Pain, Difficulty walking, Decreased balance, Improper body mechanics, Postural dysfunction  Visit Diagnosis: Muscle weakness (generalized)  Unsteadiness on feet     Problem List Patient Active Problem List   Diagnosis Date Noted  . T8 vertebral fracture, with delayed healing, subsequent encounter 02/15/2015  . Osteoporosis   . Benign essential tremor     Darrel Hoover  PT 02/15/2018, 11:58 AM  [facility] [address] [place], [place], [address] Phone: [phone]   Fax:  [phone]  Name: DREANA BRITZ MRN: [id number] Date of Birth: 1950/10/22

## 2018-03-03 ENCOUNTER — Ambulatory Visit: Payer: Medicare Other

## 2018-03-03 DIAGNOSIS — M6281 Muscle weakness (generalized): Secondary | ICD-10-CM | POA: Diagnosis not present

## 2018-03-03 DIAGNOSIS — R2681 Unsteadiness on feet: Secondary | ICD-10-CM | POA: Diagnosis not present

## 2018-03-03 NOTE — Therapy (Addendum)
[place], [place], [address] Phone: [phone]   Fax:  [phone]  Physical Therapy Treatment/Discharge  Patient Details  Name: Hannah Perez MRN: [id number] Date of Birth: Oct 29, 1950 Referring Provider (PT): Cari Caraway, MD   Encounter Date: 03/03/2018  PT End of Session - 03/03/18 1316    Visit Number  14    Number of Visits  16    Date for PT Re-Evaluation  03/18/18    Authorization Type  MCR  PN at visit 43, KX at 52    PT Start Time  0118    PT Stop Time  0215    PT Time Calculation (min)  57 min    Activity Tolerance  Patient tolerated treatment well    Behavior During Therapy  [facility] for tasks assessed/performed       Past Medical History:  Diagnosis Date  . Asthma    trigger from dog hair, dust , grass  . Benign essential tremor   . Hypercholesteremia   . Osteoporosis 2009   on forteo  . Vertigo     Past Surgical History:  Procedure Laterality Date  . CATARACT EXTRACTION Left 04/2012  . KYPHOPLASTY    . KYPHOPLASTY N/A 02/15/2015   Procedure: Thoracic eight Kyphoplasty;  Surgeon: Kristeen Miss, MD;  Location: [place] [facility];  Service: Neurosurgery;  Laterality: N/A;  T8 Kyphoplasty  . TONSILLECTOMY    . TUBAL LIGATION      There were no vitals filed for this visit.  Subjective Assessment - 03/03/18 1321    Subjective  Doing Ok today > No pain. Practicing but still have away to go    Currently in Pain?  No/denies                       [facility] Adult PT Treatment/Exercise - 03/03/18 0001      Neuro Re-ed    Neuro Re-ed Details   Worked with guidance full set of forms from begining to end  and she is able to do the first half from moving mountain with only minor errors  and cand do most of later forms correctly post demo but combining the forms has  not  been as good.  Continue to take part of session to work on foot work to transition  with brush knee and  to take half  steps for weight shifting  forward and back.    Worked on the final forms of  Tai Chi for arthritis and  fall prevention.                   PT Long Term Goals - 03/03/18 1437      PT LONG TERM GOAL #1   Title  Pt will demo gross LE strength to 5/5    Baseline  4+/5    Status  Partially Met      PT LONG TERM GOAL #2   Title  BERG to improve by 4 points    Status  Achieved      PT LONG TERM GOAL #3   Title  Pt will verbalize improved confidence in daily ambulation in open spaces by 50%    Baseline  Reports improved confidence    Status  Achieved      PT LONG TERM GOAL #4   Title  Pt will be safe and independent with Tai Chi exercises for home use    Baseline  progressing to  next level of forms. no falls at home generally or with Tai chi practice    Status  Partially Met            Plan - 03/03/18 1316    Clinical Impression Statement  She has some conflicts to be in PT to progress due to father assistance .   She demonstrates much improved weight and body shifting and smoothness with forms.   I think we can justify only 2-3 more sessions. so will extend    PT Treatment/Interventions  Therapeutic activities;Therapeutic exercise;Patient/family education    PT Next Visit Plan  continue transition to forms post moving mountain form, footwork. .     PT Home Exercise Plan  arm swing/heel-toe, stand without hands, form 1 and 2-3-4-5-6 in tai chi for  arthritis, practice brush knee to RT/:LT and punch /parry and move mountain segments.     Consulted and Agree with Plan of Care  Patient       Patient will benefit from skilled therapeutic intervention in order to improve the following deficits and impairments:  Abnormal gait, Decreased activity tolerance, Decreased strength, Pain, Difficulty walking, Decreased balance, Improper body mechanics, Postural dysfunction  Visit Diagnosis: Muscle weakness (generalized)  Unsteadiness on feet     Problem List Patient Active  Problem List   Diagnosis Date Noted  . T8 vertebral fracture, with delayed healing, subsequent encounter 02/15/2015  . Osteoporosis   . Benign essential tremor     Darrel Hoover  PT 03/03/2018, 2:40 PM  [facility] [address] [place], [place], [address] Phone: [phone]   Fax:  [phone]  Name: Hannah Perez MRN: [id number] Date of Birth: Nov 23, 1950 PHYSICAL THERAPY DISCHARGE SUMMARY  Visits from Start of Care: 14  Current functional level related to goals / functional outcomes: See above She was doing well with Tai Chi and Covid  And other obligations of Ms Peddy stopped PT . She was also hospitilized with UTI. She has not retuned   Remaining deficits: Unknown   Education / Equipment: HEP Plan:                                                    Patient goals were partially met. Patient is being discharged due to not returning since the last visit.  ?????    Pearson Forster PT 09/12/18

## 2018-03-12 DIAGNOSIS — N390 Urinary tract infection, site not specified: Secondary | ICD-10-CM | POA: Diagnosis not present

## 2018-03-18 ENCOUNTER — Other Ambulatory Visit: Payer: Self-pay

## 2018-03-18 ENCOUNTER — Inpatient Hospital Stay (HOSPITAL_COMMUNITY)
Admission: EM | Admit: 2018-03-18 | Discharge: 2018-03-22 | DRG: 689 | Disposition: A | Discharge status: Home Health | Payer: Medicare Other | Attending: Internal Medicine | Admitting: Internal Medicine

## 2018-03-18 ENCOUNTER — Emergency Department (HOSPITAL_COMMUNITY): Payer: Medicare Other

## 2018-03-18 ENCOUNTER — Encounter (HOSPITAL_COMMUNITY): Payer: Self-pay | Admitting: *Deleted

## 2018-03-18 DIAGNOSIS — I959 Hypotension, unspecified: Secondary | ICD-10-CM | POA: Diagnosis present

## 2018-03-18 DIAGNOSIS — N390 Urinary tract infection, site not specified: Principal | ICD-10-CM | POA: Diagnosis present

## 2018-03-18 DIAGNOSIS — R319 Hematuria, unspecified: Secondary | ICD-10-CM | POA: Diagnosis not present

## 2018-03-18 DIAGNOSIS — M810 Age-related osteoporosis without current pathological fracture: Secondary | ICD-10-CM | POA: Diagnosis present

## 2018-03-18 DIAGNOSIS — A419 Sepsis, unspecified organism: Secondary | ICD-10-CM | POA: Diagnosis not present

## 2018-03-18 DIAGNOSIS — Z1629 Resistance to other single specified antibiotic: Secondary | ICD-10-CM | POA: Diagnosis not present

## 2018-03-18 DIAGNOSIS — D649 Anemia, unspecified: Secondary | ICD-10-CM | POA: Diagnosis present

## 2018-03-18 DIAGNOSIS — R000 Tachycardia, unspecified: Secondary | ICD-10-CM | POA: Diagnosis not present

## 2018-03-18 DIAGNOSIS — B9620 Unspecified Escherichia coli [E. coli] as the cause of diseases classified elsewhere: Secondary | ICD-10-CM | POA: Diagnosis present

## 2018-03-18 DIAGNOSIS — E876 Hypokalemia: Secondary | ICD-10-CM | POA: Diagnosis present

## 2018-03-18 DIAGNOSIS — R0600 Dyspnea, unspecified: Secondary | ICD-10-CM | POA: Diagnosis not present

## 2018-03-18 DIAGNOSIS — E538 Deficiency of other specified B group vitamins: Secondary | ICD-10-CM | POA: Diagnosis present

## 2018-03-18 DIAGNOSIS — R0602 Shortness of breath: Secondary | ICD-10-CM | POA: Diagnosis not present

## 2018-03-18 DIAGNOSIS — E7800 Pure hypercholesterolemia, unspecified: Secondary | ICD-10-CM | POA: Diagnosis present

## 2018-03-18 DIAGNOSIS — E785 Hyperlipidemia, unspecified: Secondary | ICD-10-CM | POA: Diagnosis present

## 2018-03-18 DIAGNOSIS — G9340 Encephalopathy, unspecified: Secondary | ICD-10-CM | POA: Diagnosis present

## 2018-03-18 DIAGNOSIS — G250 Essential tremor: Secondary | ICD-10-CM | POA: Diagnosis present

## 2018-03-18 DIAGNOSIS — J4520 Mild intermittent asthma, uncomplicated: Secondary | ICD-10-CM | POA: Diagnosis present

## 2018-03-18 DIAGNOSIS — R0902 Hypoxemia: Secondary | ICD-10-CM | POA: Diagnosis not present

## 2018-03-18 DIAGNOSIS — G9341 Metabolic encephalopathy: Secondary | ICD-10-CM | POA: Diagnosis present

## 2018-03-18 DIAGNOSIS — R0689 Other abnormalities of breathing: Secondary | ICD-10-CM | POA: Diagnosis not present

## 2018-03-18 DIAGNOSIS — D638 Anemia in other chronic diseases classified elsewhere: Secondary | ICD-10-CM | POA: Diagnosis present

## 2018-03-18 DIAGNOSIS — Z79899 Other long term (current) drug therapy: Secondary | ICD-10-CM

## 2018-03-18 DIAGNOSIS — E860 Dehydration: Secondary | ICD-10-CM | POA: Diagnosis present

## 2018-03-18 HISTORY — DX: Deficiency of other specified B group vitamins: E53.8

## 2018-03-18 LAB — COMPREHENSIVE METABOLIC PANEL
ALBUMIN: 2.2 g/dL — AB (ref 3.5–5.0)
ALT: 29 U/L (ref 0–44)
ANION GAP: 10 (ref 5–15)
AST: 36 U/L (ref 15–41)
Alkaline Phosphatase: 117 U/L (ref 38–126)
BILIRUBIN TOTAL: 1.1 mg/dL (ref 0.3–1.2)
BUN: 15 mg/dL (ref 8–23)
CO2: 18 mmol/L — ABNORMAL LOW (ref 22–32)
Calcium: 8.4 mg/dL — ABNORMAL LOW (ref 8.9–10.3)
Chloride: 106 mmol/L (ref 98–111)
Creatinine, Ser: 1.33 mg/dL — ABNORMAL HIGH (ref 0.44–1.00)
GFR calc Af Amer: 48 mL/min — ABNORMAL LOW (ref >60)
GFR calc non Af Amer: 42 mL/min — ABNORMAL LOW (ref >60)
GLUCOSE: 146 mg/dL — AB (ref 70–99)
POTASSIUM: 3.2 mmol/L — AB (ref 3.5–5.1)
SODIUM: 134 mmol/L — AB (ref 135–145)
TOTAL PROTEIN: 6.6 g/dL (ref 6.5–8.1)

## 2018-03-18 LAB — CBC WITH DIFFERENTIAL/PLATELET
Abs Immature Granulocytes: 0.27 10*3/uL — ABNORMAL HIGH (ref 0.00–0.07)
BASOS ABS: 0 10*3/uL (ref 0.0–0.1)
Basophils Relative: 0 %
EOS ABS: 0.1 10*3/uL (ref 0.0–0.5)
EOS PCT: 0 %
HCT: 30.7 % — ABNORMAL LOW (ref 36.0–46.0)
Hemoglobin: 9.9 g/dL — ABNORMAL LOW (ref 12.0–15.0)
Immature Granulocytes: 2 %
Lymphocytes Relative: 16 %
Lymphs Abs: 2.2 10*3/uL (ref 0.7–4.0)
MCH: 30.7 pg (ref 26.0–34.0)
MCHC: 32.2 g/dL (ref 30.0–36.0)
MCV: 95.3 fL (ref 80.0–100.0)
Monocytes Absolute: 1.3 10*3/uL — ABNORMAL HIGH (ref 0.1–1.0)
Monocytes Relative: 10 %
NRBC: 0 % (ref 0.0–0.2)
Neutro Abs: 9.8 10*3/uL — ABNORMAL HIGH (ref 1.7–7.7)
Neutrophils Relative %: 72 %
Platelets: 287 10*3/uL (ref 150–400)
RBC: 3.22 MIL/uL — AB (ref 3.87–5.11)
RDW: 13.7 % (ref 11.5–15.5)
WBC: 13.7 10*3/uL — ABNORMAL HIGH (ref 4.0–10.5)

## 2018-03-18 LAB — I-STAT TROPONIN, ED: TROPONIN I, POC: 0.01 ng/mL (ref 0.00–0.08)

## 2018-03-18 LAB — AMMONIA: Ammonia: 31 umol/L (ref 9–35)

## 2018-03-18 MED ORDER — SODIUM CHLORIDE 0.9 % IV BOLUS
1000.0000 mL | Freq: Once | INTRAVENOUS | Status: AC
  Administered 2018-03-19: 1000 mL via INTRAVENOUS

## 2018-03-18 MED ORDER — SODIUM CHLORIDE 0.9 % IV BOLUS
1000.0000 mL | Freq: Once | INTRAVENOUS | Status: AC
  Administered 2018-03-18: 1000 mL via INTRAVENOUS

## 2018-03-18 NOTE — ED Triage Notes (Signed)
/  Pt here via [facility] from home for altered mental status since Saturday when she was dx with a uti and sob.  When ems arrivedpt was breathing around 27 with sats of 95%, bp 89/49, hr 90 cbg 165.  Pt given 400 ns, increasing bp to 104/71.  Pt ao x 4, though responses are slow.

## 2018-03-18 NOTE — ED Provider Notes (Addendum)
[facility] EMERGENCY DEPARTMENT Provider Note   CSN: [id number] Arrival date & time: 03/18/18  1946    History   Chief Complaint Chief Complaint  Patient presents with  . Altered Mental Status    HPI Hannah Perez is a 68 y.o. female with h/o asthma here via EMS from home for evaluation of weakness.  She describes it as her muscles are weak, generalized.  She woke up and tried to get out of bed but felt too weak so she lowered herself on the ground. Her daughters found her sitting on the ground and looked like she was breathing faster.  They reports in the last weak she has been more "slow" to answer mostly at nights, but acute confusion  In the last 48 hours she had one serving of rice and Fanta soda.  Nothing to eat or drink in the last 24 hours.  She is very thirsty.  Reports chronic gait instability, going to PT for this.  States since her parents passed away 01-21-2020and 23-Mar-2020she has felt more generally weak and fatigued.  She is eating and drinking less.  Diagnosed with UTI in UC in [place] recently. She is taking bactrim, realized she was supposed to take it BID but only taking one a day.  Chronic head tremors, unchanged.    No recent med changes. No falls. Pt denies syncope, head trauma, CP or SOB, nausea, vomiting, diarrhea, constipation, dysuria, hematuria. H/o anemia but does not take supplements for this. No melena. Hemorrhoids with intermittent hematochezia last noticed 2 weeks ago.     HPI  Past Medical History:  Diagnosis Date  . Asthma    trigger from dog hair, dust , grass  . Benign essential tremor   . Hypercholesteremia   . Osteoporosis 2009   on forteo  . Vertigo     Patient Active Problem List   Diagnosis Date Noted  . T8 vertebral fracture, with delayed healing, subsequent encounter 02/15/2015  . Osteoporosis   . Benign essential tremor     Past Surgical History:  Procedure Laterality Date  . CATARACT EXTRACTION Left 04/2012  .  KYPHOPLASTY    . KYPHOPLASTY N/A 02/15/2015   Procedure: Thoracic eight Kyphoplasty;  Surgeon: Barnett Abu, MD;  Location: [facility];  Service: Neurosurgery;  Laterality: N/A;  T8 Kyphoplasty  . TONSILLECTOMY    . TUBAL LIGATION       OB History    Gravida  2   Para  2   Term  2   Preterm      AB      Living  2     SAB      TAB      Ectopic      Multiple      Live Births  2            Home Medications    Prior to Admission medications   Medication Sig Start Date End Date Taking? Authorizing Provider  Calcium Carb-Cholecalciferol (CALCIUM 500 +D PO) Take 1,000 mg by mouth daily.   Yes [provider]  Cholecalciferol (VITAMIN D-3) 5000 UNITS TABS Take 5,000 Units by mouth daily.    Yes [provider]  fenofibrate 54 MG tablet Take 54 mg by mouth at bedtime.    Yes [provider]  ibandronate (BONIVA) 150 MG tablet Take 150 mg by mouth every 30 (thirty) days. Take in the morning with a full glass of water,  on an empty stomach, and do not take anything else by mouth or lie down for the next 30 min.   Yes [provider]  naproxen sodium (ALEVE) 220 MG tablet Take 440 mg by mouth 2 (two) times daily as needed (pain).   Yes [provider]  primidone (MYSOLINE) 250 MG tablet Take 250 mg by mouth 2 (two) times daily.    Yes [provider]  propranolol ER (INDERAL LA) 80 MG 24 hr capsule Take 80 mg by mouth daily. 12/27/17  Yes [provider]  rosuvastatin (CRESTOR) 20 MG tablet Take 20 mg by mouth at bedtime.    Yes [provider]  sulfamethoxazole-trimethoprim (BACTRIM DS,SEPTRA DS) 800-160 MG tablet Take 1 tablet by mouth daily. 10 day course filled 03/12/18 03/12/18  Yes [provider]  cephALEXin (KEFLEX) 500 MG capsule Take 1 capsule (500 mg total) by mouth 3 (three) times daily. 03/19/18   Liberty Handy, PA-C    Family History Family History  Problem Relation Age of Onset    . Heart disease Father   . Hypothyroidism Mother   . Osteoporosis Mother   . Hypertension Mother     Social History Social History   Tobacco Use  . Smoking status: Never Smoker  . Smokeless tobacco: Never Used  Substance Use Topics  . Alcohol use: No  . Drug use: No     Allergies   Penicillins   Review of Systems Review of Systems  Neurological: Positive for weakness (generalized).  Psychiatric/Behavioral: Positive for confusion.  All other systems reviewed and are negative.    Physical Exam Updated Vital Signs BP 95/64 (BP Location: Right Arm)   Pulse 79   Temp 98.4 F (36.9 C) (Oral)   Resp (!) 26   Ht  (1.6 m)   Wt 66.2 kg   LMP 06/24/2003   SpO2 100%   BMI 25.86 kg/m   Physical Exam Vitals signs and nursing note reviewed.  Constitutional:      Appearance: She is well-developed.     Comments: Non toxic  HENT:     Head: Normocephalic and atraumatic.     Nose: Nose normal.     Mouth/Throat:     Mouth: Mucous membranes are dry.     Comments: Dry lips, sticking together. Dry MM.  Eyes:     Conjunctiva/sclera: Conjunctivae normal.     Pupils: Pupils are equal, round, and reactive to light.  Neck:     Musculoskeletal: Normal range of motion.  Cardiovascular:     Rate and Rhythm: Normal rate and regular rhythm.     Comments: 1+ radial and DP pulses bilaterally. No LE edema or calf tenderness.  Pulmonary:     Effort: Pulmonary effort is normal.     Breath sounds: Normal breath sounds.     Comments: Normal WOB. Speaking in full sentences.  Abdominal:     General: Bowel sounds are normal.     Palpations: Abdomen is soft.     Tenderness: There is no abdominal tenderness.     Comments: No G/R/R. No suprapubic or CVA tenderness. Negative Murphy's and McBurney's. Active BS to lower quadrants.   Genitourinary:    Rectum: Guaiac result negative.     Comments:  2 small non thrombosed, non tender, non bleeding hemorrhoids.  No induration or swelling  of the perianal skin.  Scant amount of brown with no gross blood noted.   No signs of perirectal abscess.  DRE reveals good  sphincter tone.  Negative hemoccult.  Musculoskeletal: Normal range of motion.  Skin:    General: Skin is warm and dry.     Capillary Refill: Capillary refill takes less than 2 seconds.  Neurological:     Mental Status: She is alert and oriented to person, place, and time.     Comments:  Alert and oriented to self, place, time and event.  Speech is fluent without dysarthria or dysphasia. Strength 5/5 with hand grip and ankle F/E.   Sensation to light touch intact in face, hands and feet. Normal gait No pronator drift. No leg drop. Normal finger-to-nose and feet tapping.  CN I not tested CN II grossly intact visual fields bilaterally. Unable to visualize posterior eye. CN III, IV, VI PEERL and EOMs intact bilaterally CN V light touch intact in all 3 divisions of trigeminal nerve CN VII facial movements symmetric CN VIII not tested CN IX, X no uvula deviation, symmetric rise of soft palate  CN XI 5/5 SCM and trapezius strength bilaterally  CN XII Midline tongue protrusion, symmetric L/R movements  Psychiatric:        Behavior: Behavior normal.      ED Treatments / Results  Labs (all labs ordered are listed, but only abnormal results are displayed) Labs Reviewed  CBC WITH DIFFERENTIAL/PLATELET - Abnormal; Notable for the following components:      Result Value   WBC 13.7 (*)    RBC 3.22 (*)    Hemoglobin 9.9 (*)    HCT 30.7 (*)    Neutro Abs 9.8 (*)    Monocytes Absolute 1.3 (*)    Abs Immature Granulocytes 0.27 (*)    All other components within normal limits  COMPREHENSIVE METABOLIC PANEL - Abnormal; Notable for the following components:   Sodium 134 (*)    Potassium 3.2 (*)    CO2 18 (*)    Glucose, Bld 146 (*)    Creatinine, Ser 1.33 (*)    Calcium 8.4 (*)    Albumin 2.2 (*)    GFR calc non Af Amer 42 (*)    GFR calc Af Amer 48 (*)    All  other components within normal limits  URINALYSIS, ROUTINE W REFLEX MICROSCOPIC - Abnormal; Notable for the following components:   Color, Urine AMBER (*)    APPearance CLOUDY (*)    Hgb urine dipstick SMALL (*)    Protein, ur 100 (*)    Leukocytes,Ua LARGE (*)    WBC, UA >50 (*)    Bacteria, UA RARE (*)    All other components within normal limits  RAPID URINE DRUG SCREEN, HOSP PERFORMED - Abnormal; Notable for the following components:   Barbiturates POSITIVE (*)    All other components within normal limits  URINE CULTURE  AMMONIA  I-STAT TROPONIN, ED  POC OCCULT BLOOD, ED    EKG None  Radiology Dg Chest 2 View  Result Date: 03/18/2018 CLINICAL DATA:  Confusion, increased work of breathing EXAM: CHEST - 2 VIEW COMPARISON:  12/31/2014, 05/07/2016 FINDINGS: No acute airspace disease or effusion. Normal heart size. No pneumothorax. 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 Stable multiple adjacent mild to moderate compression fracture deformities of the midthoracic spine. IMPRESSION: No active cardiopulmonary disease. Electronically Signed   By: Jasmine Pang M.D.   On: 03/18/2018 20:56    Procedures .Critical Care Performed by: Liberty Handy, PA-C Authorized by: Liberty Handy, PA-C   Critical care provider statement:    Critical  care time (minutes):  45   Critical care was necessary to treat or prevent imminent or life-threatening deterioration of the following conditions:  Sepsis and respiratory failure   Critical care was time spent personally by me on the following activities:  Discussions with consultants, evaluation of patient's response to treatment, examination of patient, ordering and performing treatments and interventions, ordering and review of laboratory studies, ordering and review of radiographic studies, pulse oximetry, re-evaluation of patient's condition, obtaining history from patient or surrogate and review of old charts    (including critical care time)  Medications Ordered in ED Medications  potassium chloride SA (K-DUR,KLOR-CON) CR tablet 40 mEq (has no administration in time range)  cefTRIAXone (ROCEPHIN) 1 g in sodium chloride 0.9 % 100 mL IVPB (has no administration in time range)  sodium chloride 0.9 % bolus 1,000 mL (0 mLs Intravenous Stopped 03/18/18 2344)  sodium chloride 0.9 % bolus 1,000 mL (1,000 mLs Intravenous New Bag/Given 03/19/18 0031)     Initial Impression / Assessment and Plan / ED Course  I have reviewed the triage vital signs and the nursing notes.  Pertinent labs & imaging results that were available during my care of the patient were reviewed by me and considered in my medical decision making (see chart for details).  Clinical Course as of Mar 18 104  Sat Mar 19, 2018  0044 Leukocytes,Ua(!): LARGE [CG]  0044 WBC, UA(!): >50 [CG]  0044 Bacteria, UA(!): RARE [CG]  0044 Hgb urine dipstick(!): SMALL [CG]  0044 Protein(!): 100 [CG]    Clinical Course User Index [CG] Liberty Handy, PA-C        ddx includes occult infection vs metabolic abnormalities vs symptomatic anemia.  No head trauma. No syncope.  No LOC. No neuro symptoms, normal neuro exam without focal weakness.  She has no associated exertional/pleuritic CP, SOB, palpitations.  Clinically she looks dehydrated which could be contributing.   0100: BPs soft but overall improving after 1L IVF.  WBC 13.7, Hgb 9.9, K 3.2, creatinine 1.33 with normal BUN. Hbg 9.9 lower than previous two years ago. Pt states she has been told she is anemic in the past. No melena, negative hemoccult.  UA with infection, protein but no blood.  CXR w/o infection, EKG w/o ischemia, trop undetectable.  I personally ambulated pt in room and she had no Cp, SOB, light-headedness or drop in SpO2.  She feels better after IVF.  Discussed UA findings. Suspect transient confusion, generalized weakness secondary to dehydration and UTI.  No suprapubic or CVA  tendenress. No fever, tachycardia.  She is comfortable being discharged after 2nd L IVF. Recommended f/u with PCP in 48 hours, return precautions given.  Will hand pt off to oncoming EDPA.  Anticipate discharge unless clinical decline, hypotension, can consider observation.   Final Clinical Impressions(s) / ED Diagnoses   Final diagnoses:  Generalized weakness  Acute cystitis with hematuria  Low hemoglobin    ED Discharge Orders         Ordered    cephALEXin (KEFLEX) 500 MG capsule  3 times daily     03/19/18 0045           Liberty Handy, PA-C 03/19/18 0107    Glynn Octave, MD 03/20/18 0935    Liberty Handy, PA-C 04/04/18 8916    Glynn Octave, MD 04/08/18 1043

## 2018-03-19 ENCOUNTER — Emergency Department (HOSPITAL_COMMUNITY): Payer: Medicare Other

## 2018-03-19 ENCOUNTER — Encounter (HOSPITAL_COMMUNITY): Payer: Self-pay | Admitting: Radiology

## 2018-03-19 DIAGNOSIS — G9341 Metabolic encephalopathy: Secondary | ICD-10-CM | POA: Diagnosis not present

## 2018-03-19 DIAGNOSIS — M810 Age-related osteoporosis without current pathological fracture: Secondary | ICD-10-CM | POA: Diagnosis present

## 2018-03-19 DIAGNOSIS — D638 Anemia in other chronic diseases classified elsewhere: Secondary | ICD-10-CM | POA: Diagnosis present

## 2018-03-19 DIAGNOSIS — Z1629 Resistance to other single specified antibiotic: Secondary | ICD-10-CM | POA: Diagnosis present

## 2018-03-19 DIAGNOSIS — J4520 Mild intermittent asthma, uncomplicated: Secondary | ICD-10-CM | POA: Diagnosis present

## 2018-03-19 DIAGNOSIS — I959 Hypotension, unspecified: Secondary | ICD-10-CM | POA: Diagnosis not present

## 2018-03-19 DIAGNOSIS — E7800 Pure hypercholesterolemia, unspecified: Secondary | ICD-10-CM | POA: Diagnosis present

## 2018-03-19 DIAGNOSIS — R0602 Shortness of breath: Secondary | ICD-10-CM | POA: Diagnosis not present

## 2018-03-19 DIAGNOSIS — N390 Urinary tract infection, site not specified: Secondary | ICD-10-CM | POA: Diagnosis not present

## 2018-03-19 DIAGNOSIS — E876 Hypokalemia: Secondary | ICD-10-CM | POA: Diagnosis not present

## 2018-03-19 DIAGNOSIS — E785 Hyperlipidemia, unspecified: Secondary | ICD-10-CM | POA: Diagnosis not present

## 2018-03-19 DIAGNOSIS — G9340 Encephalopathy, unspecified: Secondary | ICD-10-CM | POA: Diagnosis present

## 2018-03-19 DIAGNOSIS — E860 Dehydration: Secondary | ICD-10-CM | POA: Diagnosis not present

## 2018-03-19 DIAGNOSIS — B9620 Unspecified Escherichia coli [E. coli] as the cause of diseases classified elsewhere: Secondary | ICD-10-CM | POA: Diagnosis not present

## 2018-03-19 DIAGNOSIS — D519 Vitamin B12 deficiency anemia, unspecified: Secondary | ICD-10-CM | POA: Diagnosis not present

## 2018-03-19 DIAGNOSIS — E538 Deficiency of other specified B group vitamins: Secondary | ICD-10-CM | POA: Diagnosis not present

## 2018-03-19 DIAGNOSIS — G250 Essential tremor: Secondary | ICD-10-CM | POA: Diagnosis not present

## 2018-03-19 DIAGNOSIS — Z79899 Other long term (current) drug therapy: Secondary | ICD-10-CM | POA: Diagnosis not present

## 2018-03-19 DIAGNOSIS — D649 Anemia, unspecified: Secondary | ICD-10-CM | POA: Diagnosis not present

## 2018-03-19 LAB — RAPID URINE DRUG SCREEN, HOSP PERFORMED
Amphetamines: NOT DETECTED
BARBITURATES: POSITIVE — AB
Benzodiazepines: NOT DETECTED
Cocaine: NOT DETECTED
Opiates: NOT DETECTED
TETRAHYDROCANNABINOL: NOT DETECTED

## 2018-03-19 LAB — URINALYSIS, ROUTINE W REFLEX MICROSCOPIC
BILIRUBIN URINE: NEGATIVE
GLUCOSE, UA: NEGATIVE mg/dL
Ketones, ur: NEGATIVE mg/dL
NITRITE: NEGATIVE
PH: 6 (ref 5.0–8.0)
Protein, ur: 100 mg/dL — AB
SPECIFIC GRAVITY, URINE: 1.015 (ref 1.005–1.030)
WBC, UA: >50 WBC/hpf — ABNORMAL HIGH (ref 0–5)

## 2018-03-19 LAB — POC OCCULT BLOOD, ED: Fecal Occult Bld: NEGATIVE

## 2018-03-19 LAB — D-DIMER, QUANTITATIVE: D-Dimer, Quant: 5.74 ug/mL-FEU — ABNORMAL HIGH (ref 0.00–0.50)

## 2018-03-19 MED ORDER — ACETAMINOPHEN 500 MG PO TABS
1000.0000 mg | ORAL_TABLET | Freq: Once | ORAL | Status: AC
  Administered 2018-03-19: 1000 mg via ORAL
  Filled 2018-03-19: qty 2

## 2018-03-19 MED ORDER — ACETAMINOPHEN 325 MG PO TABS
650.0000 mg | ORAL_TABLET | Freq: Four times a day (QID) | ORAL | Status: DC | PRN
  Administered 2018-03-20: 650 mg via ORAL
  Filled 2018-03-19: qty 2

## 2018-03-19 MED ORDER — ENOXAPARIN SODIUM 40 MG/0.4ML ~~LOC~~ SOLN
40.0000 mg | Freq: Every day | SUBCUTANEOUS | Status: DC
  Administered 2018-03-19 – 2018-03-22 (×4): 40 mg via SUBCUTANEOUS
  Filled 2018-03-19 (×4): qty 0.4

## 2018-03-19 MED ORDER — IOPAMIDOL (ISOVUE-370) INJECTION 76%
INTRAVENOUS | Status: AC
  Administered 2018-03-19: 50 mL
  Filled 2018-03-19: qty 50

## 2018-03-19 MED ORDER — ONDANSETRON HCL 4 MG PO TABS: 4.0000 mg | ORAL_TABLET | Freq: Four times a day (QID) | ORAL | Status: DC | PRN

## 2018-03-19 MED ORDER — POTASSIUM CHLORIDE CRYS ER 20 MEQ PO TBCR
40.0000 meq | EXTENDED_RELEASE_TABLET | Freq: Once | ORAL | Status: AC
  Administered 2018-03-19: 40 meq via ORAL
  Filled 2018-03-19: qty 2

## 2018-03-19 MED ORDER — PROPRANOLOL HCL ER 80 MG PO CP24
80.0000 mg | ORAL_CAPSULE | Freq: Every day | ORAL | Status: DC
  Administered 2018-03-20 – 2018-03-22 (×3): 80 mg via ORAL
  Filled 2018-03-19 (×4): qty 1

## 2018-03-19 MED ORDER — SODIUM CHLORIDE 0.9 % IV SOLN
1.0000 g | Freq: Once | INTRAVENOUS | Status: AC
  Administered 2018-03-19: 1 g via INTRAVENOUS
  Filled 2018-03-19: qty 10

## 2018-03-19 MED ORDER — ALBUTEROL SULFATE (2.5 MG/3ML) 0.083% IN NEBU
5.0000 mg | INHALATION_SOLUTION | Freq: Once | RESPIRATORY_TRACT | Status: AC
  Administered 2018-03-19: 5 mg via RESPIRATORY_TRACT
  Filled 2018-03-19: qty 6

## 2018-03-19 MED ORDER — IPRATROPIUM BROMIDE 0.02 % IN SOLN
0.5000 mg | Freq: Once | RESPIRATORY_TRACT | Status: AC
  Administered 2018-03-19: 0.5 mg via RESPIRATORY_TRACT
  Filled 2018-03-19: qty 2.5

## 2018-03-19 MED ORDER — ROSUVASTATIN CALCIUM 20 MG PO TABS
20.0000 mg | ORAL_TABLET | Freq: Every day | ORAL | Status: DC
  Administered 2018-03-19 – 2018-03-21 (×3): 20 mg via ORAL
  Filled 2018-03-19 (×3): qty 1

## 2018-03-19 MED ORDER — ACETAMINOPHEN 650 MG RE SUPP: 650.0000 mg | Freq: Four times a day (QID) | RECTAL | Status: DC | PRN

## 2018-03-19 MED ORDER — PRIMIDONE 250 MG PO TABS
250.0000 mg | ORAL_TABLET | Freq: Two times a day (BID) | ORAL | Status: DC
  Administered 2018-03-19 – 2018-03-22 (×7): 250 mg via ORAL
  Filled 2018-03-19 (×7): qty 1

## 2018-03-19 MED ORDER — IBANDRONATE SODIUM 150 MG PO TABS: 150.0000 mg | ORAL_TABLET | ORAL | Status: DC

## 2018-03-19 MED ORDER — FENOFIBRATE 54 MG PO TABS
54.0000 mg | ORAL_TABLET | Freq: Every day | ORAL | Status: DC
  Administered 2018-03-19 – 2018-03-21 (×3): 54 mg via ORAL
  Filled 2018-03-19 (×3): qty 1

## 2018-03-19 MED ORDER — ONDANSETRON HCL 4 MG/2ML IJ SOLN: 4.0000 mg | Freq: Four times a day (QID) | INTRAMUSCULAR | Status: DC | PRN

## 2018-03-19 MED ORDER — CEPHALEXIN 500 MG PO CAPS: 500.0000 mg | ORAL_CAPSULE | Freq: Three times a day (TID) | ORAL | 0 refills | Status: DC

## 2018-03-19 MED ORDER — ALBUTEROL SULFATE (2.5 MG/3ML) 0.083% IN NEBU: 2.5000 mg | INHALATION_SOLUTION | RESPIRATORY_TRACT | Status: DC | PRN

## 2018-03-19 MED ORDER — SODIUM CHLORIDE 0.9 % IV SOLN
1.0000 g | INTRAVENOUS | Status: DC
  Administered 2018-03-20 – 2018-03-21 (×2): 1 g via INTRAVENOUS
  Filled 2018-03-19 (×2): qty 10

## 2018-03-19 NOTE — ED Notes (Signed)
ED TO INPATIENT HANDOFF REPORT  ED Nurse Name and Phone #: Neldon Labella [phone]  S Name/Age/Gender Hannah Perez 68 y.o. female Room/Bed: 034C/034C  Code Status   Code Status: Not on file  Home/SNF/Other Home Patient oriented to: x4 Is this baseline? Yes   Triage Complete: Triage complete  Chief Complaint UTI, SHOB, Hypotension  Triage Note /Pt here via [facility] from home for altered mental status since Saturday when she was dx with a uti and sob.  When ems arrivedpt was breathing around 27 with sats of 95%, bp 89/49, hr 90 cbg 165.  Pt given 400 ns, increasing bp to 104/71.  Pt ao x 4, though responses are slow.   Allergies Allergies  Allergen Reactions  . Penicillins Itching    Did it involve swelling of the face/tongue/throat, SOB, or low BP? Unknown Did it involve sudden or severe rash/hives, skin peeling, or any reaction on the inside of your mouth or nose? Unknown Did you need to seek medical attention at a hospital or doctor's office? No When did it last happen?68 yrs old? If all above answers are "NO", may proceed with cephalosporin use.    Level of Care/Admitting Diagnosis ED Disposition    ED Disposition Condition Comment   Admit  Hospital Area: [facility] [[id number]]  Level of Care: Med-Surg [16]  Diagnosis: Encephalopathy acute [149702]  Admitting Physician: Dorcas Carrow [[id number]]  Attending Physician: Dorcas Carrow [[id number]]  Estimated length of stay: past midnight tomorrow  Certification:: I certify this patient will need inpatient services for at least 2 midnights  PT Class (Do Not Modify): Inpatient [101]  PT Acc Code (Do Not Modify): Private [1]       B Medical/Surgery History Past Medical History:  Diagnosis Date  . Asthma    trigger from dog hair, dust , grass  . Benign essential tremor   . Hypercholesteremia   . Osteoporosis 2009   on forteo  . Vertigo    Past Surgical History:  Procedure Laterality Date  . CATARACT  EXTRACTION Left 04/2012  . KYPHOPLASTY    . KYPHOPLASTY N/A 02/15/2015   Procedure: Thoracic eight Kyphoplasty;  Surgeon: Barnett Abu, MD;  Location: [facility];  Service: Neurosurgery;  Laterality: N/A;  T8 Kyphoplasty  . TONSILLECTOMY    . TUBAL LIGATION       A IV Location/Drains/Wounds Patient Lines/Drains/Airways Status   Active Line/Drains/Airways    Name:   Placement date:   Placement time:   Site:   Days:   Peripheral IV 03/18/18 Left Wrist   03/18/18    -    Wrist   1   Peripheral IV 03/19/18 Left Antecubital   03/19/18    0656    Antecubital   less than 1   Incision (Closed) 02/15/15 Back   02/15/15    1635     1128          Intake/Output Last 24 hours  Intake/Output Summary (Last 24 hours) at 03/19/2018 0815 Last data filed at 03/18/2018 2345 Gross per 24 hour  Intake 1000 ml  Output -  Net 1000 ml    Labs/Imaging Results for orders placed or performed during the hospital encounter of 03/18/18 (from the past 48 hour(s))  CBC with Differential     Status: Abnormal   Collection Time: 03/18/18  8:37 PM  Result Value Ref Range   WBC 13.7 (H) 4.0 - 10.5 K/uL   RBC 3.22 (L) 3.87 - 5.11 MIL/uL  Hemoglobin 9.9 (L) 12.0 - 15.0 g/dL   HCT 82.9 (L) 56.2 - 13.0 %   MCV 95.3 80.0 - 100.0 fL   MCH 30.7 26.0 - 34.0 pg   MCHC 32.2 30.0 - 36.0 g/dL   RDW 86.5 78.4 - 69.6 %   Platelets 287 150 - 400 K/uL   nRBC 0.0 0.0 - 0.2 %   Neutrophils Relative % 72 %   Neutro Abs 9.8 (H) 1.7 - 7.7 K/uL   Lymphocytes Relative 16 %   Lymphs Abs 2.2 0.7 - 4.0 K/uL   Monocytes Relative 10 %   Monocytes Absolute 1.3 (H) 0.1 - 1.0 K/uL   Eosinophils Relative 0 %   Eosinophils Absolute 0.1 0.0 - 0.5 K/uL   Basophils Relative 0 %   Basophils Absolute 0.0 0.0 - 0.1 K/uL   Immature Granulocytes 2 %   Abs Immature Granulocytes 0.27 (H) 0.00 - 0.07 K/uL    Comment: Performed at [facility], [address] [place], [place] [address]  Comprehensive metabolic panel     Status: Abnormal    Collection Time: 03/18/18  8:37 PM  Result Value Ref Range   Sodium 134 (L) 135 - 145 mmol/L   Potassium 3.2 (L) 3.5 - 5.1 mmol/L   Chloride 106 98 - 111 mmol/L   CO2 18 (L) 22 - 32 mmol/L   Glucose, Bld 146 (H) 70 - 99 mg/dL   BUN 15 8 - 23 mg/dL   Creatinine, Ser 4.13 (H) 0.44 - 1.00 mg/dL   Calcium 8.4 (L) 8.9 - 10.3 mg/dL   Total Protein 6.6 6.5 - 8.1 g/dL   Albumin 2.2 (L) 3.5 - 5.0 g/dL   AST 36 15 - 41 U/L   ALT 29 0 - 44 U/L   Alkaline Phosphatase 117 38 - 126 U/L   Total Bilirubin 1.1 0.3 - 1.2 mg/dL   GFR calc non Af Amer 42 (L) >60 mL/min   GFR calc Af Amer 48 (L) >60 mL/min   Anion gap 10 5 - 15    Comment: Performed at [facility], [address], [place], [place] [address]  Ammonia     Status: None   Collection Time: 03/18/18  8:37 PM  Result Value Ref Range   Ammonia 31 9 - 35 umol/L    Comment: Performed at [facility], [address] [place], [place] [address]  D-dimer, quantitative (not at [facility])     Status: Abnormal   Collection Time: 03/18/18  8:37 PM  Result Value Ref Range   D-Dimer, Quant 5.74 (H) 0.00 - 0.50 ug/mL-FEU    Comment: (NOTE) At the manufacturer cut-off of 0.50 ug/mL FEU, this assay has been documented to exclude PE with a sensitivity and negative predictive value of 97 to 99%.  At this time, this assay has not been approved by the FDA to exclude DVT/VTE. Results should be correlated with clinical presentation. Performed at [facility], [address], [place], [place] [address]   I-Stat Troponin, ED (not at [facility])     Status: None   Collection Time: 03/18/18  8:42 PM  Result Value Ref Range   Troponin i, poc 0.01 0.00 - 0.08 ng/mL   Comment 3            Comment: Due to the release kinetics of cTnI, a negative result within the first hours of the onset of symptoms does not rule out myocardial infarction with certainty. If  myocardial infarction is still suspected, repeat the test at appropriate intervals.    Urinalysis, Routine w reflex microscopic     Status: Abnormal   Collection Time: 03/19/18 12:11 AM  Result Value Ref Range   Color, Urine AMBER (A) YELLOW    Comment: BIOCHEMICALS MAY BE AFFECTED BY COLOR   APPearance CLOUDY (A) CLEAR   Specific Gravity, Urine 1.015 1.005 - 1.030   pH 6.0 5.0 - 8.0   Glucose, UA NEGATIVE NEGATIVE mg/dL   Hgb urine dipstick SMALL (A) NEGATIVE   Bilirubin Urine NEGATIVE NEGATIVE   Ketones, ur NEGATIVE NEGATIVE mg/dL   Protein, ur 254 (A) NEGATIVE mg/dL   Nitrite NEGATIVE NEGATIVE   Leukocytes,Ua LARGE (A) NEGATIVE   RBC / HPF 0-5 0 - 5 RBC/hpf   WBC, UA >50 (H) 0 - 5 WBC/hpf   Bacteria, UA RARE (A) NONE SEEN   Squamous Epithelial / LPF 0-5 0 - 5    Comment: Performed at [facility], [address] [place], [place] [address]  Rapid urine drug screen (hospital performed)     Status: Abnormal   Collection Time: 03/19/18 12:11 AM  Result Value Ref Range   Opiates NONE DETECTED NONE DETECTED   Cocaine NONE DETECTED NONE DETECTED   Benzodiazepines NONE DETECTED NONE DETECTED   Amphetamines NONE DETECTED NONE DETECTED   Tetrahydrocannabinol NONE DETECTED NONE DETECTED   Barbiturates POSITIVE (A) NONE DETECTED    Comment: (NOTE) DRUG SCREEN FOR MEDICAL PURPOSES ONLY.  IF CONFIRMATION IS NEEDED FOR ANY PURPOSE, NOTIFY LAB WITHIN 5 DAYS. LOWEST DETECTABLE LIMITS FOR URINE DRUG SCREEN Drug Class                     Cutoff (ng/mL) Amphetamine and metabolites    1000 Barbiturate and metabolites    200 Benzodiazepine                 200 Tricyclics and metabolites     300 Opiates and metabolites        300 Cocaine and metabolites        300 THC                            50 Performed at [facility], [address], [place], [place] [address]   POC occult blood, ED     Status: None   Collection Time: 03/19/18 12:47 AM  Result Value Ref Range   Fecal Occult Bld NEGATIVE NEGATIVE   Dg Chest 2 View  Result Date: 03/19/2018 CLINICAL  DATA:  Shortness of breath after intravenous fluid. History of asthma. EXAM: CHEST - 2 VIEW COMPARISON:  Chest radiograph March 18, 2018 FINDINGS: Cardiomediastinal silhouette is normal. Mildly calcified aortic arch. No pleural effusions or focal consolidations. Trachea projects midline and there is no pneumothorax. Soft tissue planes and included osseous structures are non-suspicious. Kyphosis, osteopenia multilevel vertebral body cement augmentation. IMPRESSION: 1. Stable examination, no acute cardiopulmonary process. Electronically Signed   By: Awilda Metro M.D.   On: 03/19/2018 02:56   Dg Chest 2 View  Result Date: 03/18/2018 CLINICAL DATA:  Confusion, increased work of breathing EXAM: CHEST - 2 VIEW COMPARISON:  12/31/2014, 05/07/2016 FINDINGS: No acute airspace disease or effusion. Normal heart size. No pneumothorax. 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 Stable multiple adjacent mild to moderate compression fracture deformities of the midthoracic spine. IMPRESSION: No active cardiopulmonary disease. Electronically Signed  By: Jasmine Pang M.D.   On: 03/18/2018 20:56   Ct Angio Chest Pe W And/or Wo Contrast  Result Date: 03/19/2018 CLINICAL DATA:  Shortness of breath for 3 weeks. Being treated for urinary tract infection. Tachypnea. Elevated D-dimer. EXAM: CT ANGIOGRAPHY CHEST WITH CONTRAST TECHNIQUE: Multidetector CT imaging of the chest was performed using the standard protocol during bolus administration of intravenous contrast. Multiplanar CT image reconstructions and MIPs were obtained to evaluate the vascular anatomy. CONTRAST:  50mL ISOVUE-370 IOPAMIDOL (ISOVUE-370) INJECTION 76% COMPARISON:  Chest radiograph of earlier in the day. No prior CT. FINDINGS: Cardiovascular: The quality of this exam for evaluation of pulmonary embolism is good. Minimal motion degradation involves the lung bases. No evidence of pulmonary embolism. Aortic and branch vessel  atherosclerosis. Normal heart size, without pericardial effusion. Mediastinum/Nodes: No mediastinal or hilar adenopathy. Lungs/Pleura: Trace bilateral pleural fluid. Minimal dependent bibasilar atelectasis. Upper Abdomen: Normal imaged portions of the liver, spleen, stomach, pancreas, adrenal glands, kidneys. Abdominal aortic and branch vessel atherosclerosis. Musculoskeletal: Osteopenia. Multilevel thoracic compression deformities with multilevel vertebral augmentation. No ventral canal encroachment. Review of the MIP images confirms the above findings. IMPRESSION: 1. No evidence of pulmonary embolism. 2. Trace bilateral pleural fluid. Aortic Atherosclerosis (ICD10-I70.0). Electronically Signed   By: Jeronimo Greaves M.D.   On: 03/19/2018 08:06    Pending Labs Unresulted Labs (From admission, onward)    Start     Ordered   03/19/18 0305  Blood culture (routine x 2)  BLOOD CULTURE X 2,   STAT     03/19/18 0304   03/18/18 2019  Urine culture  ONCE - STAT,   STAT     03/18/18 2019   Signed and Held  HIV antibody (Routine Testing)  Once,   R     Signed and Held   Signed and Held  Basic metabolic panel  Tomorrow morning,   R     Signed and Held   Signed and Held  CBC  Tomorrow morning,   R     Signed and Held          Vitals/Pain Today's Vitals   03/19/18 0251 03/19/18 0630 03/19/18 0637 03/19/18 0804  BP: 111/68 (!) 114/58    Pulse: 89 88    Resp: (!) 27 (!) 29    Temp:   (!) 100.9 F (38.3 C)   TempSrc:      SpO2: 99% 96%    Weight:      Height:      PainSc:    0-No pain    Isolation Precautions No active isolations  Medications Medications  sodium chloride 0.9 % bolus 1,000 mL (0 mLs Intravenous Stopped 03/18/18 2344)  sodium chloride 0.9 % bolus 1,000 mL (0 mLs Intravenous Stopped 03/19/18 0115)  potassium chloride SA (K-DUR,KLOR-CON) CR tablet 40 mEq (40 mEq Oral Given 03/19/18 0116)  cefTRIAXone (ROCEPHIN) 1 g in sodium chloride 0.9 % 100 mL IVPB (0 g Intravenous Stopped 03/19/18  0406)  albuterol (PROVENTIL) (2.5 MG/3ML) 0.083% nebulizer solution 5 mg (5 mg Nebulization Given 03/19/18 0250)  ipratropium (ATROVENT) nebulizer solution 0.5 mg (0.5 mg Nebulization Given 03/19/18 0250)  acetaminophen (TYLENOL) tablet 1,000 mg (1,000 mg Oral Given 03/19/18 0647)  iopamidol (ISOVUE-370) 76 % injection (50 mLs  Contrast Given 03/19/18 0715)    Mobility walks High fall risk   Focused Assessments Neuro Assessment Handoff:  Swallow screen pass? not indicated         Neuro Assessment: Within Defined Limits Neuro Checks:  Last Documented NIHSS Modified Score:   Has TPA been given? No If patient is a Neuro Trauma and patient is going to OR before floor call report to 4N Charge nurse: [phone] or [phone]     R Recommendations: See Admitting Provider Note  Report given to:   Additional Notes:

## 2018-03-19 NOTE — Progress Notes (Signed)
PHARMACIST - PHYSICIAN COMMUNICATION  CONCERNING: P&T Medication Policy Regarding Oral Bisphosphonates  RECOMMENDATION: Your order for alendronate (Fosamax), ibandronate (Boniva), or risedronate (Actonel) has been discontinued at this time.  If the patient's post-hospital medical condition warrants safe use of this class of drugs, please resume the pre-hospital regimen upon discharge.  DESCRIPTION:  Alendronate (Fosamax), ibandronate (Boniva), and risedronate (Actonel) can cause severe esophageal erosions in patients who are unable to remain upright at least 30 minutes after taking this medication.   Since brief interruptions in therapy are thought to have minimal impact on bone mineral density, the Pharmacy & Therapeutics Committee has established that bisphosphonate orders should be routinely discontinued during hospitalization.   To override this safety policy and permit administration of Boniva, Fosamax, or Actonel in the hospital, prescribers must write "DO NOT HOLD" in the comments section when placing the order for this class of medications.  Curtiss Mahmood S. Emma Birchler, PharmD, BCPS Clinical Staff Pharmacist  

## 2018-03-19 NOTE — ED Notes (Signed)
Pt still has labored breathing

## 2018-03-19 NOTE — H&P (Signed)
History and Physical    Hannah Perez DGU:[id number] DOB: 12-13-1950 DOA: 03/18/2018  PCP: Gweneth Dimitri, MD  Patient coming from: home   I have personally briefly reviewed patient's old medical records available.   Chief Complaint: confusion  HPI: Hannah Perez is a 68 y.o. female with medical history significant of essential tremors, hyperlipidemia and mild intermittent asthma presenting to the emergency room with confusion, brought by family.  Currently patient is able to give her history.  According to the patient, she has mostly controlled chronic medical problems and never has to be hospitalized.  02/25/2024her father passed away in [place], she was very busy with different issues, she was having burning urination and dysuria for about a week but could not sought any help.  They came back home, went to urgent care and was diagnosed with UTI and Bactrim was prescribed.  Patient apparently took only 1 tablet a day for last 5 days.  Patient herself has improved urinary symptoms.  Denies any fever, chills, nausea, vomiting, chest pain, shortness of breath, abdominal pain or suprapubic tenderness.  Denies any focal weaknesses.Her daughter noticed that last few days she has been very sleepy, weak and not active.  She is not eating very well.  Family was concerned about confusion.  They reported that earlier yesterday patient was out in her car and walked out of the car without stopping it.  She was also noted to be lost in the conversation at times.  Patient herself states that she just feels weak otherwise denies any complaints.  Also has occasional shortness of breath. ED Course: In the emergency room, temperature 100.9, initial blood pressure was 90/51.  WBC count 13.7.  Creatinine 1.33, no baseline available.  Urinalysis with grossly infected urine.  Chest x-ray is normal. Her d-dimer was elevated, a CTA of the chest was done that was negative for PE, pneumonia.  She received 2 L of IV fluids in  the ER after that she had some shortness of breath that has improved now.  Given a dose of Rocephin in the ER.  Review of Systems: As per HPI otherwise 10 point review of systems negative.    Past Medical History:  Diagnosis Date  . Asthma    trigger from dog hair, dust , grass  . Benign essential tremor   . Hypercholesteremia   . Osteoporosis 2009   on forteo  . Vertigo     Past Surgical History:  Procedure Laterality Date  . CATARACT EXTRACTION Left 04/2012  . KYPHOPLASTY    . KYPHOPLASTY N/A 02/15/2015   Procedure: Thoracic eight Kyphoplasty;  Surgeon: Barnett Abu, MD;  Location: [facility];  Service: Neurosurgery;  Laterality: N/A;  T8 Kyphoplasty  . TONSILLECTOMY    . TUBAL LIGATION       reports that she has never smoked. She has never used smokeless tobacco. She reports that she does not drink alcohol or use drugs.  Allergies  Allergen Reactions  . Penicillins Itching    Did it involve swelling of the face/tongue/throat, SOB, or low BP? Unknown Did it involve sudden or severe rash/hives, skin peeling, or any reaction on the inside of your mouth or nose? Unknown Did you need to seek medical attention at a hospital or doctor's office? No When did it last happen?68 yrs old? If all above answers are "NO", may proceed with cephalosporin use.    Family History  Problem Relation Age of Onset  . Heart disease Father   .  Hypothyroidism Mother   . Osteoporosis Mother   . Hypertension Mother      Prior to Admission medications   Medication Sig Start Date End Date Taking? Authorizing Provider  Calcium Carb-Cholecalciferol (CALCIUM 500 +D PO) Take 1,000 mg by mouth daily.   Yes [provider]  Cholecalciferol (VITAMIN D-3) 5000 UNITS TABS Take 5,000 Units by mouth daily.    Yes [provider]  fenofibrate 54 MG tablet Take 54 mg by mouth at bedtime.    Yes [provider]  ibandronate (BONIVA) 150 MG tablet Take 150 mg by mouth every 30  (thirty) days. Take in the morning with a full glass of water, on an empty stomach, and do not take anything else by mouth or lie down for the next 30 min.   Yes [provider]  naproxen sodium (ALEVE) 220 MG tablet Take 440 mg by mouth 2 (two) times daily as needed (pain).   Yes [provider]  primidone (MYSOLINE) 250 MG tablet Take 250 mg by mouth 2 (two) times daily.    Yes [provider]  propranolol ER (INDERAL LA) 80 MG 24 hr capsule Take 80 mg by mouth daily. 12/27/17  Yes [provider]  rosuvastatin (CRESTOR) 20 MG tablet Take 20 mg by mouth at bedtime.    Yes [provider]  sulfamethoxazole-trimethoprim (BACTRIM DS,SEPTRA DS) 800-160 MG tablet Take 1 tablet by mouth daily. 10 day course filled 03/12/18 03/12/18  Yes [provider]  cephALEXin (KEFLEX) 500 MG capsule Take 1 capsule (500 mg total) by mouth 3 (three) times daily. 03/19/18   Liberty Handy, PA-C    Physical Exam: Vitals:   03/18/18 2355 03/19/18 0251 03/19/18 0630 03/19/18 0637  BP: 95/64 111/68 (!) 114/58   Pulse: 79 89 88   Resp: (!) 26 (!) 27 (!) 29   Temp: 98.4 F (36.9 C)   (!) 100.9 F (38.3 C)  TempSrc: Oral     SpO2: 100% 99% 96%   Weight:      Height:        Constitutional: NAD, calm, comfortable Vitals:   03/18/18 2355 03/19/18 0251 03/19/18 0630 03/19/18 0637  BP: 95/64 111/68 (!) 114/58   Pulse: 79 89 88   Resp: (!) 26 (!) 27 (!) 29   Temp: 98.4 F (36.9 C)   (!) 100.9 F (38.3 C)  TempSrc: Oral     SpO2: 100% 99% 96%   Weight:      Height:       Eyes: PERRL, lids and conjunctivae normal ENMT: Mucous membranes are moist. Posterior pharynx clear of any exudate or lesions.Normal dentition.  Neck: normal, supple, no masses, no thyromegaly Respiratory: clear to auscultation bilaterally, no wheezing, no crackles. Normal respiratory effort. No accessory muscle use.  Cardiovascular: Regular rate and rhythm, no murmurs / rubs / gallops.  No extremity edema. 2+ pedal pulses. No carotid bruits.  Abdomen: no tenderness, no masses palpated. No hepatosplenomegaly. Bowel sounds positive.  Musculoskeletal: no clubbing / cyanosis. No joint deformity upper and lower extremities. Good ROM, no contractures. Normal muscle tone.  Skin: no rashes, lesions, ulcers. No induration Neurologic: CN 2-12 grossly intact. Sensation intact, DTR normal. Strength 5/5 in all 4.  Psychiatric: Normal judgment and insight. Alert and oriented x 3. Normal mood.     Labs on Admission: I have personally reviewed following labs and imaging studies  CBC: Recent Labs  Lab 03/18/18 2037  WBC 13.7*  NEUTROABS 9.8*  HGB  9.9*  HCT 30.7*  MCV 95.3  PLT 287   Basic Metabolic Panel: Recent Labs  Lab 03/18/18 2037  NA 134*  K 3.2*  CL 106  CO2 18*  GLUCOSE 146*  BUN 15  CREATININE 1.33*  CALCIUM 8.4*   GFR: Estimated Creatinine Clearance: 38.5 mL/min (A) (by C-G formula based on SCr of 1.33 mg/dL (H)). Liver Function Tests: Recent Labs  Lab 03/18/18 2037  AST 36  ALT 29  ALKPHOS 117  BILITOT 1.1  PROT 6.6  ALBUMIN 2.2*   No results for input(s): LIPASE, AMYLASE in the last 168 hours. Recent Labs  Lab 03/18/18 2037  AMMONIA 31   Coagulation Profile: No results for input(s): INR, PROTIME in the last 168 hours. Cardiac Enzymes: No results for input(s): CKTOTAL, CKMB, CKMBINDEX, TROPONINI in the last 168 hours. BNP (last 3 results) No results for input(s): PROBNP in the last 8760 hours. HbA1C: No results for input(s): HGBA1C in the last 72 hours. CBG: No results for input(s): GLUCAP in the last 168 hours. Lipid Profile: No results for input(s): CHOL, HDL, LDLCALC, TRIG, CHOLHDL, LDLDIRECT in the last 72 hours. Thyroid Function Tests: No results for input(s): TSH, T4TOTAL, FREET4, T3FREE, THYROIDAB in the last 72 hours. Anemia Panel: No results for input(s): VITAMINB12, FOLATE, FERRITIN, TIBC, IRON, RETICCTPCT in the last 72  hours. Urine analysis:    Component Value Date/Time   COLORURINE AMBER (A) 03/19/2018 0011   APPEARANCEUR CLOUDY (A) 03/19/2018 0011   LABSPEC 1.015 03/19/2018 0011   PHURINE 6.0 03/19/2018 0011   GLUCOSEU NEGATIVE 03/19/2018 0011   HGBUR SMALL (A) 03/19/2018 0011   BILIRUBINUR NEGATIVE 03/19/2018 0011   KETONESUR NEGATIVE 03/19/2018 0011   PROTEINUR 100 (A) 03/19/2018 0011   NITRITE NEGATIVE 03/19/2018 0011   LEUKOCYTESUR LARGE (A) 03/19/2018 0011    Radiological Exams on Admission: Dg Chest 2 View  Result Date: 03/19/2018 CLINICAL DATA:  Shortness of breath after intravenous fluid. History of asthma. EXAM: CHEST - 2 VIEW COMPARISON:  Chest radiograph March 18, 2018 FINDINGS: Cardiomediastinal silhouette is normal. Mildly calcified aortic arch. No pleural effusions or focal consolidations. Trachea projects midline and there is no pneumothorax. Soft tissue planes and included osseous structures are non-suspicious. Kyphosis, osteopenia multilevel vertebral body cement augmentation. IMPRESSION: 1. Stable examination, no acute cardiopulmonary process. Electronically Signed   By: Awilda Metro M.D.   On: 03/19/2018 02:56   Dg Chest 2 View  Result Date: 03/18/2018 CLINICAL DATA:  Confusion, increased work of breathing EXAM: CHEST - 2 VIEW COMPARISON:  12/31/2014, 05/07/2016 FINDINGS: No acute airspace disease or effusion. Normal heart size. No pneumothorax. 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 Stable multiple adjacent mild to moderate compression fracture deformities of the midthoracic spine. IMPRESSION: No active cardiopulmonary disease. Electronically Signed   By: Jasmine Pang M.D.   On: 03/18/2018 20:56   Ct Angio Chest Pe W And/or Wo Contrast  Result Date: 03/19/2018 CLINICAL DATA:  Shortness of breath for 3 weeks. Being treated for urinary tract infection. Tachypnea. Elevated D-dimer. EXAM: CT ANGIOGRAPHY CHEST WITH CONTRAST TECHNIQUE: Multidetector CT  imaging of the chest was performed using the standard protocol during bolus administration of intravenous contrast. Multiplanar CT image reconstructions and MIPs were obtained to evaluate the vascular anatomy. CONTRAST:  50mL ISOVUE-370 IOPAMIDOL (ISOVUE-370) INJECTION 76% COMPARISON:  Chest radiograph of earlier in the day. No prior CT. FINDINGS: Cardiovascular: The quality of this exam for evaluation of pulmonary embolism is good. Minimal motion degradation involves the  lung bases. No evidence of pulmonary embolism. Aortic and branch vessel atherosclerosis. Normal heart size, without pericardial effusion. Mediastinum/Nodes: No mediastinal or hilar adenopathy. Lungs/Pleura: Trace bilateral pleural fluid. Minimal dependent bibasilar atelectasis. Upper Abdomen: Normal imaged portions of the liver, spleen, stomach, pancreas, adrenal glands, kidneys. Abdominal aortic and branch vessel atherosclerosis. Musculoskeletal: Osteopenia. Multilevel thoracic compression deformities with multilevel vertebral augmentation. No ventral canal encroachment. Review of the MIP images confirms the above findings. IMPRESSION: 1. No evidence of pulmonary embolism. 2. Trace bilateral pleural fluid. Aortic Atherosclerosis (ICD10-I70.0). Electronically Signed   By: Jeronimo Greaves M.D.   On: 03/19/2018 08:06    EKG: Independently reviewed. Low voltage EKG.  No ST-T wave changes.  Sinus rhythm.  Assessment/Plan Principal Problem:   Acute metabolic encephalopathy Active Problems:   Benign essential tremor   Hyperlipidemia   Acute lower UTI   Encephalopathy acute     1.  Acute metabolic infective encephalopathy/acute UTI present on admission: Agree with admission given severity of symptoms and failed outpatient therapy. Blood cultures and urine cultures has been sent.  Will start patient on Rocephin until final cultures. Patient does not have any focal neurological deficit, suspect manifestations from acute UTI. Her symptoms  might have been aggravated by recent bereavement.  2.  Benign essential tremor:Patient is on Boniva and primidone that she will continue.  She is also on propanolol that she will continue.  3.  Hyperlipidemia: Crestor is well-tolerated that she will continue.  This patient has significant symptoms with UTI including altered mental status and failed outpatient therapy.  She is high risk of decompensation without hospitalization.  She will need IV antibiotics, inpatient monitoring and adjustment of doses.  Anticipate she will stay more than 2 midnights in the hospital.  DVT prophylaxis: Lovenox Code Status: Full code Family Communication: Daughter and grandson at the bedside Disposition Plan: Home Consults called: None Admission status: Inpatient   Dorcas Carrow MD [facility] Pager [phone]  If 7PM-7AM, please contact night-coverage [email or web address] Password [facility]  03/19/2018, 8:28 AM

## 2018-03-19 NOTE — ED Provider Notes (Signed)
"Hannah Perez is a 68 y.o. female with h/o asthma here via EMS from home for evaluation of weakness.  She describes it as her muscles are weak, generalized.  She woke up and tried to get out of bed but felt too weak so she lowered herself on the ground. Her daughters found her sitting on the ground and looked like she was breathing faster.  They reports in the last weak she has been more "slow" to answer mostly at nights, but acute confusion  In the last 48 hours she had one serving of rice and Fanta soda.  Nothing to eat or drink in the last 24 hours.  She is very thirsty.  Reports chronic gait instability, going to PT for this.  States since her parents passed away 01-15-20and 03/17/20she has felt more generally weak and fatigued.  She is eating and drinking less.  Diagnosed with UTI in UC in [place] recently. She is taking bactrim, realized she was supposed to take it BID but only taking one a day.  Chronic head tremors, unchanged.    No recent med changes. No falls. Pt denies syncope, head trauma, CP or SOB, nausea, vomiting, diarrhea, constipation, dysuria, hematuria. H/o anemia but does not take supplements for this. No melena. Hemorrhoids with intermittent hematochezia last noticed 2 weeks ago."   Family reports they were also concerned about confusion.  They report that earlier today that the patient was out in her car and she she saw a light and thought the car was in park so she opened the car door and attempted to get out of the car, but the car started rolling.  Family also reports that she has had a difficult time following conversations over the last few days and seems to get "lost" in the conversation.   Physical Exam  BP (!) 114/58   Pulse 88   Temp (!) 100.9 F (38.3 C)   Resp (!) 29   Ht 5\' 3"  (1.6 m)   Wt 66.2 kg   LMP 06/24/2003   SpO2 96%   BMI 25.86 kg/m   Physical Exam  She is tachypneic and appears uncomfortable.  Takes a breath every 1-2 words.  Audible wheezing  bilaterally.  Lung sounds are not diminished.  No lower extremity edema, redness, or erythema.  Pallor.  Alert and oriented x3.  No focal neurologic deficits.  ED Course/Procedures   Clinical Course as of Mar 18 804  Sat Mar 19, 2018  0044 Leukocytes,Ua(!): LARGE [CG]  0044 WBC, UA(!): >50 [CG]  0044 Bacteria, UA(!): RARE [CG]  0044 Hgb urine dipstick(!): SMALL [CG]  0044 Protein(!): 100 [CG]    Clinical Course User Index [CG] Liberty Handy, PA-C    Procedures  MDM   68 year old female with history of asthma, HTN received at signout from [facility] pending IV fluid bolus.  Please see her note for further work-up and medical decision making.  When I evaluated the patient after she finished her second fluid bolus, she appeared tachypneic and short of breath.  She is taking a breath and was every 1-2 words while answering questions.  She and family report this began during her second fluid bolus.  She has no known history of heart failure.  The patient was discussed with Dr. Manus Gunning, attending physician, who independently evaluated the patient.  Chest x-ray was repeated to assess for fluid overload.  Repeat chest x-ray was unremarkable.  DuoNeb given with improvement of shortness of  breath. Dr. Manus Gunning, his d-dimer, which was ordered and is elevated.  CT PE study is pending.  Low suspicion for CVA for the patient's waxing and waning confusion since most likely source is infection.   On arrival to the ER, blood pressures were soft that improved after fluid resuscitation.  UA is consistent with infection despite the patient sub-therapeutically treating her symptoms for the last 5 days with Bactrim.  She has been taking 1 dose instead of taking the medication twice daily due to confusion about the medication instructions.  Blood cultures x2 have been ordered.  She is alert and oriented x3 on my exam.  Suspect confusion may be secondary to infection.  She was given a dose of Rocephin in the  ER.  Urine cultures are pending.  On reexamination, notified by nursing staff that the patient spiked a fever and Tylenol was given.  Given the patient's overall clinical picture and concern for failed oral antibiotics versus subtherapeutic treatment, consult to the hospitalist team for admission.  Dr. Jerral Ralph has accepted the patient for admission. The patient appears reasonably stabilized for admission considering the current resources, flow, and capabilities available in the ED at this time, and I doubt any other [facility] requiring further screening and/or treatment in the ED prior to admission.       Frederik Pear A, PA-C 03/19/18 6553    Glynn Octave, MD 03/20/18 [phone]

## 2018-03-20 ENCOUNTER — Encounter (HOSPITAL_COMMUNITY): Payer: Self-pay | Admitting: Internal Medicine

## 2018-03-20 ENCOUNTER — Other Ambulatory Visit: Payer: Self-pay

## 2018-03-20 DIAGNOSIS — D649 Anemia, unspecified: Secondary | ICD-10-CM | POA: Diagnosis present

## 2018-03-20 DIAGNOSIS — E876 Hypokalemia: Secondary | ICD-10-CM | POA: Diagnosis present

## 2018-03-20 DIAGNOSIS — I959 Hypotension, unspecified: Secondary | ICD-10-CM | POA: Diagnosis present

## 2018-03-20 DIAGNOSIS — E860 Dehydration: Secondary | ICD-10-CM | POA: Diagnosis present

## 2018-03-20 DIAGNOSIS — G250 Essential tremor: Secondary | ICD-10-CM

## 2018-03-20 DIAGNOSIS — B9620 Unspecified Escherichia coli [E. coli] as the cause of diseases classified elsewhere: Secondary | ICD-10-CM | POA: Diagnosis present

## 2018-03-20 DIAGNOSIS — N390 Urinary tract infection, site not specified: Principal | ICD-10-CM

## 2018-03-20 DIAGNOSIS — E538 Deficiency of other specified B group vitamins: Secondary | ICD-10-CM | POA: Diagnosis present

## 2018-03-20 DIAGNOSIS — E785 Hyperlipidemia, unspecified: Secondary | ICD-10-CM

## 2018-03-20 HISTORY — DX: Deficiency of other specified B group vitamins: E53.8

## 2018-03-20 LAB — CBC WITH DIFFERENTIAL/PLATELET
Abs Immature Granulocytes: 0.21 10*3/uL — ABNORMAL HIGH (ref 0.00–0.07)
BASOS PCT: 0 %
Basophils Absolute: 0 10*3/uL (ref 0.0–0.1)
Eosinophils Absolute: 0.1 10*3/uL (ref 0.0–0.5)
Eosinophils Relative: 1 %
HCT: 27.8 % — ABNORMAL LOW (ref 36.0–46.0)
Hemoglobin: 9.1 g/dL — ABNORMAL LOW (ref 12.0–15.0)
Immature Granulocytes: 2 %
Lymphocytes Relative: 18 %
Lymphs Abs: 1.7 10*3/uL (ref 0.7–4.0)
MCH: 31.2 pg (ref 26.0–34.0)
MCHC: 32.7 g/dL (ref 30.0–36.0)
MCV: 95.2 fL (ref 80.0–100.0)
Monocytes Absolute: 0.8 10*3/uL (ref 0.1–1.0)
Monocytes Relative: 8 %
Neutro Abs: 6.6 10*3/uL (ref 1.7–7.7)
Neutrophils Relative %: 71 %
Platelets: 354 10*3/uL (ref 150–400)
RBC: 2.92 MIL/uL — ABNORMAL LOW (ref 3.87–5.11)
RDW: 14 % (ref 11.5–15.5)
WBC: 9.5 10*3/uL (ref 4.0–10.5)
nRBC: 0 % (ref 0.0–0.2)

## 2018-03-20 LAB — BASIC METABOLIC PANEL
Anion gap: 5 (ref 5–15)
BUN: 9 mg/dL (ref 8–23)
CO2: 20 mmol/L — ABNORMAL LOW (ref 22–32)
Calcium: 8.2 mg/dL — ABNORMAL LOW (ref 8.9–10.3)
Chloride: 110 mmol/L (ref 98–111)
Creatinine, Ser: 0.82 mg/dL (ref 0.44–1.00)
GFR calc Af Amer: >60 mL/min (ref >60)
GFR calc non Af Amer: >60 mL/min (ref >60)
Glucose, Bld: 117 mg/dL — ABNORMAL HIGH (ref 70–99)
Potassium: 3.3 mmol/L — ABNORMAL LOW (ref 3.5–5.1)
SODIUM: 135 mmol/L (ref 135–145)

## 2018-03-20 LAB — IRON AND TIBC
Iron: 33 ug/dL (ref 28–170)
Saturation Ratios: 19 % (ref 10.4–31.8)
TIBC: 176 ug/dL — ABNORMAL LOW (ref 250–450)
UIBC: 143 ug/dL

## 2018-03-20 LAB — FERRITIN: Ferritin: 330 ng/mL — ABNORMAL HIGH (ref 11–307)

## 2018-03-20 LAB — FOLATE: Folate: 5 ng/mL — ABNORMAL LOW (ref >5.9)

## 2018-03-20 LAB — VITAMIN B12: Vitamin B-12: 118 pg/mL — ABNORMAL LOW (ref 180–914)

## 2018-03-20 LAB — MAGNESIUM: MAGNESIUM: 2.1 mg/dL (ref 1.7–2.4)

## 2018-03-20 LAB — HIV ANTIBODY (ROUTINE TESTING W REFLEX): HIV Screen 4th Generation wRfx: NONREACTIVE

## 2018-03-20 MED ORDER — CYANOCOBALAMIN 1000 MCG/ML IJ SOLN
1000.0000 ug | Freq: Every day | INTRAMUSCULAR | Status: DC
  Administered 2018-03-20 – 2018-03-22 (×3): 1000 ug via INTRAMUSCULAR
  Filled 2018-03-20 (×3): qty 1

## 2018-03-20 MED ORDER — FOLIC ACID 1 MG PO TABS
1.0000 mg | ORAL_TABLET | Freq: Every day | ORAL | Status: DC
  Administered 2018-03-20 – 2018-03-22 (×3): 1 mg via ORAL
  Filled 2018-03-20 (×3): qty 1

## 2018-03-20 MED ORDER — SODIUM CHLORIDE 0.9 % IV SOLN
INTRAVENOUS | Status: DC
  Administered 2018-03-20: 09:00:00 via INTRAVENOUS

## 2018-03-20 MED ORDER — POTASSIUM CHLORIDE CRYS ER 20 MEQ PO TBCR
40.0000 meq | EXTENDED_RELEASE_TABLET | Freq: Once | ORAL | Status: AC
  Administered 2018-03-20: 40 meq via ORAL
  Filled 2018-03-20: qty 2

## 2018-03-20 MED ORDER — SODIUM CHLORIDE 0.9 % IV SOLN
INTRAVENOUS | Status: DC
  Administered 2018-03-20: 17:00:00 via INTRAVENOUS

## 2018-03-20 NOTE — Progress Notes (Addendum)
PROGRESS NOTE    Hannah Perez  XAJ:[id number] DOB: [birth year] DOA: 03/18/2018 PCP: Gweneth Dimitri, MD   Brief Narrative:  HPI per Dr. Prince Solian is a 68 y.o. female with medical history significant of essential tremors, hyperlipidemia and mild intermittent asthma presenting to the emergency room with confusion, brought by family.  Currently patient is able to give her history.  According to the patient, she has mostly controlled chronic medical problems and never has to be hospitalized.  March 25, 2024her father passed away in [place], she was very busy with different issues, she was having burning urination and dysuria for about a week but could not sought any help.  They came back home, went to urgent care and was diagnosed with UTI and Bactrim was prescribed.  Patient apparently took only 1 tablet a day for last 5 days.  Patient herself has improved urinary symptoms.  Denies any fever, chills, nausea, vomiting, chest pain, shortness of breath, abdominal pain or suprapubic tenderness.  Denies any focal weaknesses.Her daughter noticed that last few days she has been very sleepy, weak and not active.  She is not eating very well.  Family was concerned about confusion.  They reported that earlier yesterday patient was out in her car and walked out of the car without stopping it.  She was also noted to be lost in the conversation at times.  Patient herself states that she just feels weak otherwise denies any complaints.  Also has occasional shortness of breath. ED Course: In the emergency room, temperature 100.9, initial blood pressure was 90/51.  WBC count 13.7.  Creatinine 1.33, no baseline available.  Urinalysis with grossly infected urine.  Chest x-ray is normal. Her d-dimer was elevated, a CTA of the chest was done that was negative for PE, pneumonia.  She received 2 L of IV fluids in the ER after that she had some shortness of breath that has improved now.  Given a dose of Rocephin in the  ER.    Assessment & Plan:   Principal Problem:   Acute metabolic encephalopathy Active Problems:   E. coli UTI   Benign essential tremor   Hyperlipidemia   Acute lower UTI   Encephalopathy acute   Hypotension   Dehydration   Hypokalemia  1 acute metabolic encephalopathy Secondary to UTI which was present on admission in the setting of recent bereavement.  Patient received oral Bactrim in the outpatient setting for UTI however states took 1 tablet daily as opposed to 1 tablet twice daily.  Patient noted to have failed outpatient treatment.  Patient presented with symptoms.  Patient has been pancultured results pending.  Preliminary urine cultures positive for greater than 100,000 colonies of E. coli with sensitivities pending.  Vitamin B12 levels pending.  Continue IV Rocephin.  Follow.  2.  E. coli UTI Sensitivities pending.  Continue IV Rocephin.  Follow.  3.  Benign essential tremors Continue primidone.  Continue propranolol.  4.  Hyperlipidemia Continue statin.  5.  Hypokalemia Labs pending.  Check a magnesium level.  Replete.  6.  Anemia Patient with no overt bleeding.  Likely dilutional in nature.  Check an anemia panel.  Follow H&H.  7.  Generalized weakness Patient with complaints of significant weakness.  PT/OT.  8.  Dehydration Continue IV fluids.  Patient still with acute toxic metabolic encephalopathy with symptoms secondary to UTI and failed outpatient therapy.  Patient not at baseline.  Patient still requiring IV antibiotics and IV fluids.  Patient still at high risk for decompensation.  Will continue IV antibiotics and inpatient monitoring for now.   DVT prophylaxis: Lovenox Code Status: Full Family Communication: Updated patient, daughter, son-in-law and grandson at bedside. Disposition Plan: Home when clinically improved and mentation close to baseline.   Consultants:   None  Procedures:   Chest x-ray 03/19/2018, 03/18/2018  CT angiogram chest  03/19/2018  Antimicrobials:   IV Rocephin 03/19/2018   Subjective: Laying in bed.  Family at bedside.  Patient sometimes with some slow responses to questions and processing.  Denies any chest pain.  No shortness of breath.  Patient complained of significant weakness.  Objective: Vitals:   03/19/18 1942 03/20/18 0034 03/20/18 0418 03/20/18 0848  BP: 119/84 118/64 113/62 128/65  Pulse: 90 86 76 81  Resp: Temp: 98.3 F (36.8 C) 99.7 F (37.6 C) 97.7 F (36.5 C) 98.1 F (36.7 C)  TempSrc: Oral Oral Oral Oral  SpO2: 98% 96% 98% 97%  Weight:      Height:        Intake/Output Summary (Last 24 hours) at 03/20/2018 1159 Last data filed at 03/20/2018 0300 Gross per 24 hour  Intake 340 ml  Output -  Net 340 ml   Filed Weights   03/18/18 1951  Weight: 66.2 kg    Examination:  General exam: Appears calm and comfortable  Respiratory system: Clear to auscultation. Respiratory effort normal. Cardiovascular system: S1 & S2 heard, RRR. No JVD, murmurs, rubs, gallops or clicks. No pedal edema. Gastrointestinal system: Abdomen is nondistended, soft and nontender. No organomegaly or masses felt. Normal bowel sounds heard. Central nervous system: Alert and oriented. No focal neurological deficits. Extremities: Symmetric 5 x 5 power. Skin: No rashes, lesions or ulcers Psychiatry: Judgement and insight appear normal. Mood & affect appropriate.     Data Reviewed: I have personally reviewed following labs and imaging studies  CBC: Recent Labs  Lab 03/18/18 2037  WBC 13.7*  NEUTROABS 9.8*  HGB 9.9*  HCT 30.7*  MCV 95.3  PLT 287   Basic Metabolic Panel: Recent Labs  Lab 03/18/18 2037  NA 134*  K 3.2*  CL 106  CO2 18*  GLUCOSE 146*  BUN 15  CREATININE 1.33*  CALCIUM 8.4*   GFR: Estimated Creatinine Clearance: 38.5 mL/min (A) (by C-G formula based on SCr of 1.33 mg/dL (H)). Liver Function Tests: Recent Labs  Lab 03/18/18 2037  AST 36  ALT 29  ALKPHOS 117    BILITOT 1.1  PROT 6.6  ALBUMIN 2.2*   No results for input(s): LIPASE, AMYLASE in the last 168 hours. Recent Labs  Lab 03/18/18 2037  AMMONIA 31   Coagulation Profile: No results for input(s): INR, PROTIME in the last 168 hours. Cardiac Enzymes: No results for input(s): CKTOTAL, CKMB, CKMBINDEX, TROPONINI in the last 168 hours. BNP (last 3 results) No results for input(s): PROBNP in the last 8760 hours. HbA1C: No results for input(s): HGBA1C in the last 72 hours. CBG: No results for input(s): GLUCAP in the last 168 hours. Lipid Profile: No results for input(s): CHOL, HDL, LDLCALC, TRIG, CHOLHDL, LDLDIRECT in the last 72 hours. Thyroid Function Tests: No results for input(s): TSH, T4TOTAL, FREET4, T3FREE, THYROIDAB in the last 72 hours. Anemia Panel: No results for input(s): VITAMINB12, FOLATE, FERRITIN, TIBC, IRON, RETICCTPCT in the last 72 hours. Sepsis Labs: No results for input(s): PROCALCITON, LATICACIDVEN in the last 168 hours.  Recent Results (from the past 240 hour(s))  Urine culture  Status: Abnormal (Preliminary result)   Collection Time: 03/19/18 12:11 AM  Result Value Ref Range Status   Specimen Description URINE, CLEAN CATCH  Final   Special Requests NONE  Final   Culture (A)  Final    >=100,000 COLONIES/mL ESCHERICHIA COLI SUSCEPTIBILITIES TO FOLLOW Performed at [facility], [address] [place], [place] [address]    Report Status PENDING  Incomplete  Blood culture (routine x 2)     Status: None (Preliminary result)   Collection Time: 03/19/18  3:23 AM  Result Value Ref Range Status   Specimen Description BLOOD RIGHT HAND  Final   Special Requests   Final    BOTTLES DRAWN AEROBIC AND ANAEROBIC Blood Culture adequate volume   Culture   Final    NO GROWTH 1 DAY Performed at [facility], [address], [place], [place] [address]    Report Status PENDING  Incomplete  Blood culture (routine x 2)     Status: None (Preliminary result)    Collection Time: 03/19/18  3:48 AM  Result Value Ref Range Status   Specimen Description BLOOD LEFT HAND  Final   Special Requests   Final    BOTTLES DRAWN AEROBIC AND ANAEROBIC Blood Culture results may not be optimal due to an excessive volume of blood received in culture bottles   Culture   Final    NO GROWTH 1 DAY Performed at [facility], [address], [place], [place] [address]    Report Status PENDING  Incomplete         Radiology Studies: Dg Chest 2 View  Result Date: 03/19/2018 CLINICAL DATA:  Shortness of breath after intravenous fluid. History of asthma. EXAM: CHEST - 2 VIEW COMPARISON:  Chest radiograph March 18, 2018 FINDINGS: Cardiomediastinal silhouette is normal. Mildly calcified aortic arch. No pleural effusions or focal consolidations. Trachea projects midline and there is no pneumothorax. Soft tissue planes and included osseous structures are non-suspicious. Kyphosis, osteopenia multilevel vertebral body cement augmentation. IMPRESSION: 1. Stable examination, no acute cardiopulmonary process. Electronically Signed   By: Awilda Metro M.D.   On: 03/19/2018 02:56   Dg Chest 2 View  Result Date: 03/18/2018 CLINICAL DATA:  Confusion, increased work of breathing EXAM: CHEST - 2 VIEW COMPARISON:  12/31/2014, 05/07/2016 FINDINGS: No acute airspace disease or effusion. Normal heart size. No pneumothorax. 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 Stable multiple adjacent mild to moderate compression fracture deformities of the midthoracic spine. IMPRESSION: No active cardiopulmonary disease. Electronically Signed   By: Jasmine Pang M.D.   On: 03/18/2018 20:56   Ct Angio Chest Pe W And/or Wo Contrast  Result Date: 03/19/2018 CLINICAL DATA:  Shortness of breath for 3 weeks. Being treated for urinary tract infection. Tachypnea. Elevated D-dimer. EXAM: CT ANGIOGRAPHY CHEST WITH CONTRAST TECHNIQUE: Multidetector CT imaging of the chest was  performed using the standard protocol during bolus administration of intravenous contrast. Multiplanar CT image reconstructions and MIPs were obtained to evaluate the vascular anatomy. CONTRAST:  11mL ISOVUE-370 IOPAMIDOL (ISOVUE-370) INJECTION 76% COMPARISON:  Chest radiograph of earlier in the day. No prior CT. FINDINGS: Cardiovascular: The quality of this exam for evaluation of pulmonary embolism is good. Minimal motion degradation involves the lung bases. No evidence of pulmonary embolism. Aortic and branch vessel atherosclerosis. Normal heart size, without pericardial effusion. Mediastinum/Nodes: No mediastinal or hilar adenopathy. Lungs/Pleura: Trace bilateral pleural fluid. Minimal dependent bibasilar atelectasis. Upper Abdomen: Normal imaged portions of the liver, spleen,  stomach, pancreas, adrenal glands, kidneys. Abdominal aortic and branch vessel atherosclerosis. Musculoskeletal: Osteopenia. Multilevel thoracic compression deformities with multilevel vertebral augmentation. No ventral canal encroachment. Review of the MIP images confirms the above findings. IMPRESSION: 1. No evidence of pulmonary embolism. 2. Trace bilateral pleural fluid. Aortic Atherosclerosis (ICD10-I70.0). Electronically Signed   By: Jeronimo GreavesKyle  Talbot M.D.   On: 03/19/2018 08:06        Scheduled Meds: . enoxaparin (LOVENOX) injection  40 mg Subcutaneous Daily  . fenofibrate  54 mg Oral QHS  . primidone  250 mg Oral BID  . propranolol ER  80 mg Oral Daily  . rosuvastatin  20 mg Oral QHS   Continuous Infusions: . sodium chloride 100 mL/hr at 03/20/18 0857  . cefTRIAXone (ROCEPHIN)  IV 1 g (03/20/18 0110)     LOS: 1 day    Time spent: 35 minutes    Ramiro Harvestaniel Alizon Schmeling, MD [facility]  If 7PM-7AM, please contact night-coverage [email or web address] 03/20/2018, 11:59 AM

## 2018-03-21 DIAGNOSIS — E538 Deficiency of other specified B group vitamins: Secondary | ICD-10-CM

## 2018-03-21 DIAGNOSIS — I959 Hypotension, unspecified: Secondary | ICD-10-CM

## 2018-03-21 LAB — BASIC METABOLIC PANEL
ANION GAP: 10 (ref 5–15)
BUN: 7 mg/dL — ABNORMAL LOW (ref 8–23)
CO2: 18 mmol/L — ABNORMAL LOW (ref 22–32)
Calcium: 8 mg/dL — ABNORMAL LOW (ref 8.9–10.3)
Chloride: 108 mmol/L (ref 98–111)
Creatinine, Ser: 0.84 mg/dL (ref 0.44–1.00)
GFR calc non Af Amer: >60 mL/min (ref >60)
Glucose, Bld: 110 mg/dL — ABNORMAL HIGH (ref 70–99)
POTASSIUM: 3.5 mmol/L (ref 3.5–5.1)
Sodium: 136 mmol/L (ref 135–145)

## 2018-03-21 LAB — URINE CULTURE

## 2018-03-21 LAB — CBC
HCT: 29.3 % — ABNORMAL LOW (ref 36.0–46.0)
Hemoglobin: 9.5 g/dL — ABNORMAL LOW (ref 12.0–15.0)
MCH: 30.6 pg (ref 26.0–34.0)
MCHC: 32.4 g/dL (ref 30.0–36.0)
MCV: 94.5 fL (ref 80.0–100.0)
NRBC: 0 % (ref 0.0–0.2)
Platelets: 379 10*3/uL (ref 150–400)
RBC: 3.1 MIL/uL — ABNORMAL LOW (ref 3.87–5.11)
RDW: 13.8 % (ref 11.5–15.5)
WBC: 8.6 10*3/uL (ref 4.0–10.5)

## 2018-03-21 MED ORDER — POTASSIUM CHLORIDE CRYS ER 20 MEQ PO TBCR
40.0000 meq | EXTENDED_RELEASE_TABLET | Freq: Once | ORAL | Status: AC
  Administered 2018-03-21: 40 meq via ORAL
  Filled 2018-03-21: qty 2

## 2018-03-21 MED ORDER — CEPHALEXIN 500 MG PO CAPS
500.0000 mg | ORAL_CAPSULE | Freq: Three times a day (TID) | ORAL | Status: DC
  Administered 2018-03-21 – 2018-03-22 (×3): 500 mg via ORAL
  Filled 2018-03-21 (×3): qty 1

## 2018-03-21 NOTE — Care Management Note (Signed)
Case Management Note  Patient Details  Name: Hannah Perez MRN: [id number] Date of Birth: 20-Aug-1950  Subjective/Objective:      Pt admitted with acute metabolic encephalopathy. She is from home with spouse. No DME at home. No issues with her medications and no transportation issues.             Action/Plan: Pt with orders for [facility] services. CM provided the patient choice and she selected [facility]. Alvino Chapel with [facility] notified and accepted the referral.  Pt with orders for walker and 3 in 1. Jermaine with [facility] notified and will deliver to the room. CM following for further d/c needs.   Expected Discharge Date:                  Expected Discharge Plan:  Home w Home Health Services  In-House Referral:     Discharge planning Services  CM Consult  Post Acute Care Choice:  Home Health, Durable Medical Equipment Choice offered to:  Patient  DME Arranged:  3-N-1, Walker rolling DME Agency:  [facility]  HH Arranged:  PT, OT HH Agency:  [facility]  Status of Service:  In process, will continue to follow  If discussed at Long Length of Stay Meetings, dates discussed:    Additional Comments:  Kermit Balo, RN 03/21/2018, 1:32 PM

## 2018-03-21 NOTE — Evaluation (Signed)
Physical Therapy Evaluation Patient Details Name: Hannah Perez MRN: [id number] DOB: [birth year] Today's Date: 03/21/2018   History of Present Illness  Patient is a 68 y/o female who presents with AMS and weakness secondary to UTI and dehydration. PMH includes vertigo, essential tremor, osteoporosis, asthma.   Clinical Impression  Patient presents with generalized weakness, slow processing, impaired balance and dyspnea on exertion s/p above. Pt independent PTA, lives with spouse and currently doing OPPT. Tolerated transfers and gait training with close min guard-Min A for balance/safety. Might benefit from RW for extra support/safety at home. VSS throughout despite SOB. Pt with hx of asthma-reports this has worsened recently. Will follow acutely to maximize independence and mobility prior to return home.    Follow Up Recommendations HHPT;Supervision - Intermittent    Equipment Recommendations  Rolling walker with 5" wheels    Recommendations for Other Services       Precautions / Restrictions Precautions Precautions: Fall Restrictions Weight Bearing Restrictions: No      Mobility  Bed Mobility Overal bed mobility: Modified Independent             General bed mobility comments: No assist needed, increased cues and time to perform  Transfers Overall transfer level: Needs assistance Equipment used: None Transfers: Sit to/from Stand Sit to Stand: Min guard         General transfer comment: Min guard for safety. Stood from [employer], from toilet x1. No dizziness. Transferred to chair post ambulation.  Ambulation/Gait Ambulation/Gait assistance: Min assist Gait Distance (Feet): 120 Feet Assistive device: IV Pole Gait Pattern/deviations: Step-through pattern;Decreased stride length Gait velocity: decreased   General Gait Details: Slow, guarded and mildly unsteady gait holding onto IV pole for support; 2/4 DOE. VSS throughout. Reports hx of asthma. Furniture walking in room.  May benefit from RW.d  Stairs            Wheelchair Mobility    Modified Rankin (Stroke Patients Only) Modified Rankin (Stroke Patients Only) Pre-Morbid Rankin Score: Slight disability Modified Rankin: Moderately severe disability     Balance Overall balance assessment: Needs assistance Sitting-balance support: Feet supported;No upper extremity supported Sitting balance-Leahy Scale: Good Sitting balance - Comments: Able to donn socks without difficulty reaching outside BoS.    Standing balance support: During functional activity Standing balance-Leahy Scale: Fair Standing balance comment: Able to wash hands at sink leaning on counter for support as needed.                              Pertinent Vitals/Pain Pain Assessment: No/denies pain    Home Living Family/patient expects to be discharged to:: Private residence Living Arrangements: Spouse/significant other Available Help at Discharge: Family;Available PRN/intermittently Type of Home: House Home Access: Stairs to enter Entrance Stairs-Rails: Right Entrance Stairs-Number of Steps: 7 Home Layout: Two level Home Equipment: None      Prior Function Level of Independence: Independent         Comments: Cooking, cleaning, drives. Doing OPPT. Recently doing tai chi     Hand Dominance   Dominant Hand: Right    Extremity/Trunk Assessment   Upper Extremity Assessment Upper Extremity Assessment: Defer to OT evaluation    Lower Extremity Assessment Lower Extremity Assessment: Generalized weakness(Sensation WFL)       Communication   Communication: No difficulties  Cognition Arousal/Alertness: Awake/alert Behavior During Therapy: WFL for tasks assessed/performed Overall Cognitive Status: Impaired/Different from baseline Area of Impairment: Memory;Problem solving  Memory: Decreased short-term memory       Problem Solving: Slow processing General Comments: Only  able to state 1/3 words for short term memory recall. Not sure of baseline? Slow to respond to questions. "Feel embarrassed about not taking my medicine correctly for my UTi."      General Comments      Exercises     Assessment/Plan    PT Assessment Patient needs continued PT services  PT Problem List Decreased strength;Decreased balance;Decreased cognition;Cardiopulmonary status limiting activity;Decreased activity tolerance;Decreased mobility       PT Treatment Interventions Functional mobility training;Balance training;Patient/family education;Gait training;Therapeutic activities;Stair training;Therapeutic exercise;Neuromuscular re-education;Cognitive remediation;DME instruction    PT Goals (Current goals can be found in the Care Plan section)  Acute Rehab PT Goals Patient Stated Goal: to get home  PT Goal Formulation: With patient Time For Goal Achievement: 04/04/18 Potential to Achieve Goals: Good    Frequency Min 3X/week   Barriers to discharge Decreased caregiver support spouse is self employed but can be flexible    Co-evaluation               AM-PAC PT "6 Clicks" Mobility  Outcome Measure Help needed turning from your back to your side while in a flat bed without using bedrails?: A Little Help needed moving from lying on your back to sitting on the side of a flat bed without using bedrails?: A Little Help needed moving to and from a bed to a chair (including a wheelchair)?: A Little Help needed standing up from a chair using your arms (e.g., wheelchair or bedside chair)?: None Help needed to walk in hospital room?: A Little Help needed climbing 3-5 steps with a railing? : A Little 6 Click Score: 19    End of Session Equipment Utilized During Treatment: Gait belt Activity Tolerance: Patient tolerated treatment well Patient left: in chair;with call bell/phone within reach;with chair alarm set Nurse Communication: Mobility status PT Visit Diagnosis: Muscle  weakness (generalized) (M62.81);Unsteadiness on feet (R26.81)    Time: 4707-6151 PT Time Calculation (min) (ACUTE ONLY): 29 min   Charges:   PT Evaluation $PT Eval Moderate Complexity: 1 Mod PT Treatments $Gait Training: 8-22 mins        Wray Kearns, PT, DPT Acute Rehabilitation Services Pager [phone] Office [place] 03/21/2018, 8:55 AM

## 2018-03-21 NOTE — Evaluation (Signed)
Occupational Therapy Evaluation Patient Details Name: Hannah Perez MRN: [id number] DOB: 01-Apr-1950 Today's Date: 03/21/2018    History of Present Illness Patient is a 68 y/o female who presents with AMS and weakness secondary to UTI and dehydration. PMH includes vertigo, essential tremor, osteoporosis, asthma.    Clinical Impression   PTA patient reports independent and driving.  Admitted for above, and limited by impaired balance, decreased activity tolerance, generalized weakness, and impaired cognition (short term memory). Pt limited with decreased short term memory, but passes the Pill Box test with 1 error only (required 10 minutes to complete)- see general comments.Patient currently requires min guard for toilet transfers and in room functional mobility, setup assist for UB ADLs, min guard for LB ADLs. Patient will benefit from continued OT services while admitted and after discharge at [facility] level in order to maximize independence and return to PLOF with ADLs/ mobility.      Follow Up Recommendations  Home health OT;Supervision - Intermittent    Equipment Recommendations  3 in 1 bedside commode    Recommendations for Other Services       Precautions / Restrictions Precautions Precautions: Fall Restrictions Weight Bearing Restrictions: No      Mobility Bed Mobility Overal bed mobility: Modified Independent             General bed mobility comments: OOB upon entry  Transfers Overall transfer level: Needs assistance Equipment used: None Transfers: Sit to/from Stand Sit to Stand: Min guard         General transfer comment: min guard for safety and balance    Balance Overall balance assessment: Needs assistance Sitting-balance support: Feet supported;No upper extremity supported Sitting balance-Leahy Scale: Good Sitting balance - Comments: Able to donn socks without difficulty reaching outside BoS.    Standing balance support: During functional activity;No  upper extremity supported Standing balance-Leahy Scale: Fair Standing balance comment: preference to UE support, furniture walking in room                            ADL either performed or assessed with clinical judgement   ADL Overall ADL's : Needs assistance/impaired     Grooming: Min guard;Standing;Oral care;Brushing hair   Upper Body Bathing: Set up;Sitting   Lower Body Bathing: Sit to/from stand;Min guard   Upper Body Dressing : Set up;Sitting   Lower Body Dressing: Min guard;Sit to/from stand   Toilet Transfer: Min guard;Ambulation Toilet Transfer Details (indicate cue type and reason): simulated to/from recliner          Functional mobility during ADLs: Min guard;Cueing for safety General ADL Comments: pt furniture walking throughout room, requires min guard for safety and balance; limited by STM and generalized weakness     Vision Baseline Vision/History: Wears glasses Wears Glasses: Reading only Patient Visual Report: No change from baseline Vision Assessment?: No apparent visual deficits     Perception     Praxis      Pertinent Vitals/Pain Pain Assessment: No/denies pain     Hand Dominance Right   Extremity/Trunk Assessment Upper Extremity Assessment Upper Extremity Assessment: Generalized weakness   Lower Extremity Assessment Lower Extremity Assessment: Defer to PT evaluation       Communication Communication Communication: No difficulties   Cognition Arousal/Alertness: Awake/alert Behavior During Therapy: WFL for tasks assessed/performed Overall Cognitive Status: Impaired/Different from baseline Area of Impairment: Memory;Problem solving  Memory: Decreased short-term memory       Problem Solving: Slow processing General Comments: pt unable to recall 3/3 words during session reporting "I started thinking about my dad and I can't remember".  Passed pill box test, see general comments   General  Comments    Functional cognition further assessed with The Pillbox Test: A Measure of Executive Functioning and Estimate of Medication Management. A straight pass/fail designation is determined by 3 or more errors of omission or misplacement on the task. The pt completed the test 1 error (as it took 10 minutes to complete, given 5 minutes).     Exercises     Shoulder Instructions      Home Living Family/patient expects to be discharged to:: Private residence Living Arrangements: Spouse/significant other Available Help at Discharge: Family;Available PRN/intermittently Type of Home: House Home Access: Stairs to enter [employer] of Steps: 7 Entrance Stairs-Rails: Right Home Layout: Two level Alternate Level Stairs-Number of Steps: 1 flight Alternate Level Stairs-Rails: Right Bathroom Shower/Tub: Teacher, early years/pre: Standard     Home Equipment: None          Prior Functioning/Environment Level of Independence: Independent        Comments: Cooking, cleaning, drives. Doing OPPT. Recently doing tai chi        OT Problem List: Decreased strength;Decreased activity tolerance;Impaired balance (sitting and/or standing);Decreased safety awareness;Decreased cognition;Decreased knowledge of use of DME or AE;Decreased knowledge of precautions      OT Treatment/Interventions: Self-care/ADL training;Energy conservation;Therapeutic exercise;DME and/or AE instruction;Therapeutic activities;Patient/family education;Balance training;Cognitive remediation/compensation    OT Goals(Current goals can be found in the care plan section) Acute Rehab OT Goals Patient Stated Goal: to get home  OT Goal Formulation: With patient Time For Goal Achievement: 04/04/18 Potential to Achieve Goals: Good  OT Frequency: Min 2X/week   Barriers to D/C:            Co-evaluation              AM-PAC OT "6 Clicks" Daily Activity     Outcome Measure Help from another  person eating meals?: None Help from another person taking care of personal grooming?: A Little Help from another person toileting, which includes using toliet, bedpan, or urinal?: A Little Help from another person bathing (including washing, rinsing, drying)?: A Little Help from another person to put on and taking off regular upper body clothing?: None Help from another person to put on and taking off regular lower body clothing?: A Little 6 Click Score: 20   End of Session Equipment Utilized During Treatment: Gait belt Nurse Communication: Mobility status  Activity Tolerance: Patient tolerated treatment well Patient left: in chair;with call bell/phone within reach;with chair alarm set  OT Visit Diagnosis: Unsteadiness on feet (R26.81);Muscle weakness (generalized) (M62.81)                Time: 9357-0177 OT Time Calculation (min): 32 min Charges:  OT General Charges $OT Visit: 1 Visit OT Evaluation $OT Eval Moderate Complexity: 1 Mod OT Treatments $Cognitive Funtion inital: Initial 15 mins  Delight Stare, OT Acute Rehabilitation Services Pager [phone] Office [phone]   Delight Stare 03/21/2018, 9:22 AM

## 2018-03-21 NOTE — Plan of Care (Signed)
Patient stable, OOB to chair tolerated well. Discussed POC with patient, agreeable with plan, denies question/concerns at this time.

## 2018-03-21 NOTE — Progress Notes (Signed)
PROGRESS NOTE    Hannah Perez  YZJ:[id number] DOB: 10/07/1950 DOA: 03/18/2018 PCP: Hannah Dimitri, MD   Brief Narrative:  HPI per Dr. Prince Perez is a 68 y.o. female with medical history significant of essential tremors, hyperlipidemia and mild intermittent asthma presenting to the emergency room with confusion, brought by family.  Currently patient is able to give her history.  According to the patient, she has mostly controlled chronic medical problems and never has to be hospitalized.  16-Mar-2024her father passed away in [place], she was very busy with different issues, she was having burning urination and dysuria for about a week but could not sought any help.  They came back home, went to urgent care and was diagnosed with UTI and Bactrim was prescribed.  Patient apparently took only 1 tablet a day for last 5 days.  Patient herself has improved urinary symptoms.  Denies any fever, chills, nausea, vomiting, chest pain, shortness of breath, abdominal pain or suprapubic tenderness.  Denies any focal weaknesses.Her daughter noticed that last few days she has been very sleepy, weak and not active.  She is not eating very well.  Family was concerned about confusion.  They reported that earlier yesterday patient was out in her car and walked out of the car without stopping it.  She was also noted to be lost in the conversation at times.  Patient herself states that she just feels weak otherwise denies any complaints.  Also has occasional shortness of breath. ED Course: In the emergency room, temperature 100.9, initial blood pressure was 90/51.  WBC count 13.7.  Creatinine 1.33, no baseline available.  Urinalysis with grossly infected urine.  Chest x-ray is normal. Her d-dimer was elevated, a CTA of the chest was done that was negative for PE, pneumonia.  She received 2 L of IV fluids in the ER after that she had some shortness of breath that has improved now.  Given a dose of Rocephin in the  ER.    Assessment & Plan:   Principal Problem:   Acute metabolic encephalopathy Active Problems:   E. coli UTI   Benign essential tremor   Hyperlipidemia   Acute lower UTI   Encephalopathy acute   Hypotension   Dehydration   Hypokalemia   Vitamin B12 deficiency   Folate deficiency   Anemia  1 acute metabolic encephalopathy Secondary to UTI which was present on admission in the setting of recent bereavement as well as probable vitamin B12 deficiency.  Patient received oral Bactrim in the outpatient setting for UTI however states took 1 tablet daily as opposed to 1 tablet twice daily.  Patient noted to have failed outpatient treatment.  Urine cultures with greater than 100,000 colonies of E. coli which is resistant to Bactrim.  Blood cultures pending with no growth to date.  Discontinue IV Rocephin and transition to oral Keflex.  Continue vitamin B12 supplementation as well as folate supplementation.  Saline lock IV fluids.  Supportive care. Follow.  2.  E. coli UTI Resistant to Bactrim.  Patient on IV Rocephin will transition to oral Keflex.  Follow.   3.  Benign essential tremors Continue primidone.  Continue propranolol.  4.  Hyperlipidemia Continue statin.  5.  Hypokalemia Potassium at 3.5 today.  K. Dur 40 mEq p.o. x1.  Magnesium was 2.1.  Follow.  6.  Anemia of chronic disease/B12 deficiency/folate deficiency Patient with no overt bleeding.  Likely dilutional in nature.  Hemoglobin stable at 9.5.  Patient denies any overt bleeding.  Anemia panel consistent with anemia of chronic disease and also with a folate deficiency and vitamin B12 deficiency.  We will give vitamin B12 IM injections as well as folate supplementation.  Outpatient follow-up.   7.  Generalized weakness Patient with complaints of significant weakness.  Patient seen by PT/OT and recommended home health therapies..  8.  Dehydration Saline lock IV fluids.   DVT prophylaxis: Lovenox Code Status:  Full Family Communication: Updated patient.  No family at bedside. Disposition Plan: Home when clinically improved and mentation close to baseline.   Consultants:   None  Procedures:   Chest x-ray 03/19/2018, 03/18/2018  CT angiogram chest 03/19/2018  Antimicrobials:   IV Rocephin 03/19/2018 >>>> 03/21/2018  Keflex 03/21/2018   Subjective: Patient laying in bed.  Slightly improved however with some slow responses to questions and processing.  Denies any chest pain.  No shortness of breath.  Still with significant weakness.  Patient with poor oral intake.  Objective: Vitals:   03/20/18 2340 03/21/18 0304 03/21/18 0800 03/21/18 1300  BP: 135/76 134/89 (!) 141/77 (!) 144/70  Pulse: 88 89 80 82  Resp: 16 16 18 18   Temp: 98.1 F (36.7 C) 98.6 F (37 C) 98.2 F (36.8 C) 98.6 F (37 C)  TempSrc: Oral Oral Oral Oral  SpO2: 97% 96% 98% 98%  Weight:      Height:        Intake/Output Summary (Last 24 hours) at 03/21/2018 1547 Last data filed at 03/21/2018 0900 Gross per 24 hour  Intake 1679.62 ml  Output -  Net 1679.62 ml   Filed Weights   03/18/18 1951  Weight: 66.2 kg    Examination:  General exam: Appears calm and comfortable Respiratory system: Lungs clear to auscultation bilaterally.  No wheezes, no crackles, no rhonchi.  Respiratory effort normal. Cardiovascular system: Regular rate rhythm no murmurs rubs or gallops.  No JVD.  No lower extremity edema.  No murmurs rubs or gallops. Gastrointestinal system: Abdomen is soft, nontender, nondistended, positive bowel sounds.  No rebound.  No guarding. Central nervous system: Alert and oriented. No focal neurological deficits. Extremities: Symmetric 5 x 5 power. Skin: No rashes, lesions or ulcers Psychiatry: Judgement and insight appear normal. Mood & affect appropriate.     Data Reviewed: I have personally reviewed following labs and imaging studies  CBC: Recent Labs  Lab 03/18/18 2037 03/20/18 1139 03/21/18 0516  WBC  13.7* 9.5 8.6  NEUTROABS 9.8* 6.6  --   HGB 9.9* 9.1* 9.5*  HCT 30.7* 27.8* 29.3*  MCV 95.3 95.2 94.5  PLT 287 354 379   Basic Metabolic Panel: Recent Labs  Lab 03/18/18 2037 03/20/18 1139 03/21/18 0516  NA 134* 135 136  K 3.2* 3.3* 3.5  CL 106 110 108  CO2 18* 20* 18*  GLUCOSE 146* 117* 110*  BUN 15 9 7*  CREATININE 1.33* 0.82 0.84  CALCIUM 8.4* 8.2* 8.0*  MG  --  2.1  --    GFR: Estimated Creatinine Clearance: 61 mL/min (by C-G formula based on SCr of 0.84 mg/dL). Liver Function Tests: Recent Labs  Lab 03/18/18 2037  AST 36  ALT 29  ALKPHOS 117  BILITOT 1.1  PROT 6.6  ALBUMIN 2.2*   No results for input(s): LIPASE, AMYLASE in the last 168 hours. Recent Labs  Lab 03/18/18 2037  AMMONIA 31   Coagulation Profile: No results for input(s): INR, PROTIME in the last 168 hours. Cardiac Enzymes: No results for  input(s): CKTOTAL, CKMB, CKMBINDEX, TROPONINI in the last 168 hours. BNP (last 3 results) No results for input(s): PROBNP in the last 8760 hours. HbA1C: No results for input(s): HGBA1C in the last 72 hours. CBG: No results for input(s): GLUCAP in the last 168 hours. Lipid Profile: No results for input(s): CHOL, HDL, LDLCALC, TRIG, CHOLHDL, LDLDIRECT in the last 72 hours. Thyroid Function Tests: No results for input(s): TSH, T4TOTAL, FREET4, T3FREE, THYROIDAB in the last 72 hours. Anemia Panel: Recent Labs    03/20/18 1139  VITAMINB12 118*  FOLATE 5.0*  FERRITIN 330*  TIBC 176*  IRON 33   Sepsis Labs: No results for input(s): PROCALCITON, LATICACIDVEN in the last 168 hours.  Recent Results (from the past 240 hour(s))  Urine culture     Status: Abnormal   Collection Time: 03/19/18 12:11 AM  Result Value Ref Range Status   Specimen Description URINE, CLEAN CATCH  Final   Special Requests   Final    NONE Performed at [facility], [address] [place], [place] [address]    Culture >=100,000 COLONIES/mL ESCHERICHIA COLI (A)  Final    Report Status 03/21/2018 FINAL  Final   Organism ID, Bacteria ESCHERICHIA COLI (A)  Final      Susceptibility   Escherichia coli - MIC*    AMPICILLIN >=32 RESISTANT Resistant     CEFAZOLIN <=4 SENSITIVE Sensitive     CEFTRIAXONE <=1 SENSITIVE Sensitive     CIPROFLOXACIN <=0.25 SENSITIVE Sensitive     GENTAMICIN <=1 SENSITIVE Sensitive     IMIPENEM <=0.25 SENSITIVE Sensitive     NITROFURANTOIN <=16 SENSITIVE Sensitive     TRIMETH/SULFA >=320 RESISTANT Resistant     AMPICILLIN/SULBACTAM 4 SENSITIVE Sensitive     PIP/TAZO <=4 SENSITIVE Sensitive     Extended ESBL NEGATIVE Sensitive     * >=100,000 COLONIES/mL ESCHERICHIA COLI  Blood culture (routine x 2)     Status: None (Preliminary result)   Collection Time: 03/19/18  3:23 AM  Result Value Ref Range Status   Specimen Description BLOOD RIGHT HAND  Final   Special Requests   Final    BOTTLES DRAWN AEROBIC AND ANAEROBIC Blood Culture adequate volume   Culture   Final    NO GROWTH 2 DAYS Performed at [facility], [address] [place], [place] [address]    Report Status PENDING  Incomplete  Blood culture (routine x 2)     Status: None (Preliminary result)   Collection Time: 03/19/18  3:48 AM  Result Value Ref Range Status   Specimen Description BLOOD LEFT HAND  Final   Special Requests   Final    BOTTLES DRAWN AEROBIC AND ANAEROBIC Blood Culture results may not be optimal due to an excessive volume of blood received in culture bottles   Culture   Final    NO GROWTH 2 DAYS Performed at [facility], [address] [place], [place] [address]    Report Status PENDING  Incomplete         Radiology Studies: No results found.      Scheduled Meds: . cyanocobalamin  1,000 mcg Intramuscular Daily  . enoxaparin (LOVENOX) injection  40 mg Subcutaneous Daily  . fenofibrate  54 mg Oral QHS  . folic acid  1 mg Oral Daily  . primidone  250 mg Oral BID  . propranolol ER  80 mg Oral Daily  . rosuvastatin  20 mg  Oral QHS   Continuous Infusions: . cefTRIAXone (  ROCEPHIN)  IV Stopped (03/21/18 0030)     LOS: 2 days    Time spent: 35 minutes    Ramiro Harvest, MD [facility]  If 7PM-7AM, please contact night-coverage [email or web address] 03/21/2018, 3:47 PM

## 2018-03-22 DIAGNOSIS — D649 Anemia, unspecified: Secondary | ICD-10-CM

## 2018-03-22 DIAGNOSIS — D519 Vitamin B12 deficiency anemia, unspecified: Secondary | ICD-10-CM

## 2018-03-22 LAB — CBC
HCT: 28.2 % — ABNORMAL LOW (ref 36.0–46.0)
Hemoglobin: 9.2 g/dL — ABNORMAL LOW (ref 12.0–15.0)
MCH: 30.6 pg (ref 26.0–34.0)
MCHC: 32.6 g/dL (ref 30.0–36.0)
MCV: 93.7 fL (ref 80.0–100.0)
NRBC: 0 % (ref 0.0–0.2)
Platelets: 374 10*3/uL (ref 150–400)
RBC: 3.01 MIL/uL — ABNORMAL LOW (ref 3.87–5.11)
RDW: 13.5 % (ref 11.5–15.5)
WBC: 7.6 10*3/uL (ref 4.0–10.5)

## 2018-03-22 LAB — BASIC METABOLIC PANEL
Anion gap: 8 (ref 5–15)
BUN: 8 mg/dL (ref 8–23)
CO2: 20 mmol/L — ABNORMAL LOW (ref 22–32)
Calcium: 8.4 mg/dL — ABNORMAL LOW (ref 8.9–10.3)
Chloride: 105 mmol/L (ref 98–111)
Creatinine, Ser: 0.73 mg/dL (ref 0.44–1.00)
GFR calc Af Amer: >60 mL/min (ref >60)
GFR calc non Af Amer: >60 mL/min (ref >60)
Glucose, Bld: 109 mg/dL — ABNORMAL HIGH (ref 70–99)
Potassium: 3.5 mmol/L (ref 3.5–5.1)
Sodium: 133 mmol/L — ABNORMAL LOW (ref 135–145)

## 2018-03-22 MED ORDER — CEPHALEXIN 500 MG PO CAPS: 500.0000 mg | ORAL_CAPSULE | Freq: Three times a day (TID) | ORAL | 0 refills | Status: AC

## 2018-03-22 MED ORDER — FOLIC ACID 1 MG PO TABS: 1.0000 mg | ORAL_TABLET | Freq: Every day | ORAL | Status: AC

## 2018-03-22 MED ORDER — VITAMIN B-12 1000 MCG PO TABS: 1000.0000 ug | ORAL_TABLET | Freq: Every day | ORAL | Status: AC

## 2018-03-22 MED ORDER — POTASSIUM CHLORIDE CRYS ER 20 MEQ PO TBCR
40.0000 meq | EXTENDED_RELEASE_TABLET | Freq: Once | ORAL | Status: AC
  Administered 2018-03-22: 40 meq via ORAL
  Filled 2018-03-22: qty 2

## 2018-03-22 NOTE — Care Management Note (Signed)
Case Management Note  Patient Details  Name: Hannah Perez MRN: [id number] Date of Birth: October 19, 1950  Subjective/Objective:                    Action/Plan: Pt discharging home with [facility] for [facility] services. Alvino Chapel with [facility] aware of d/c. Pt has needed DME at the bedside. Pt has transportation home.   Expected Discharge Date:  03/22/18               Expected Discharge Plan:  Home w Home Health Services  In-House Referral:     Discharge planning Services  CM Consult  Post Acute Care Choice:  Home Health, Durable Medical Equipment Choice offered to:  Patient  DME Arranged:  3-N-1, Walker rolling DME Agency:  [facility]  HH Arranged:  PT, OT HH Agency:  [facility]  Status of Service:  In process, will continue to follow  If discussed at Long Length of Stay Meetings, dates discussed:    Additional Comments:  Kermit Balo, RN 03/22/2018, 12:38 PM

## 2018-03-22 NOTE — Progress Notes (Signed)
Physical Therapy Treatment Patient Details Name: Hannah Perez MRN: [id number] DOB: 1950/02/14 Today's Date: 03/22/2018    History of Present Illness Patient is a 68 y/o female who presents with AMS and weakness secondary to UTI and dehydration. PMH includes vertigo, essential tremor, osteoporosis, asthma.     PT Comments    Patient progressing well towards PT goals. Balance and endurance improved with use of RW for support. Tolerated stair training with supervision for safety. Pt with difficulty dual tasking during ambulation. Fatigues quickly. Discussed importance of using RW until strength/mobility improve to decrease fall risk. Pt agreeable. Eager to get home today. Will follow.    Follow Up Recommendations  Outpatient PT;Supervision - Intermittent     Equipment Recommendations  Rolling walker with 5" wheels    Recommendations for Other Services       Precautions / Restrictions Precautions Precautions: Fall Restrictions Weight Bearing Restrictions: No    Mobility  Bed Mobility Overal bed mobility: Modified Independent                Transfers Overall transfer level: Needs assistance Equipment used: Rolling walker (2 wheeled) Transfers: Sit to/from Stand Sit to Stand: Min guard         General transfer comment: min guard for safety and balance  Ambulation/Gait Ambulation/Gait assistance: Min guard Gait Distance (Feet): 200 Feet Assistive device: Rolling walker (2 wheeled) Gait Pattern/deviations: Step-through pattern;Decreased stride length Gait velocity: decreased Gait velocity interpretation: <1.31 ft/sec, indicative of household ambulator General Gait Details: Slow, mostly steady gait with RW for most of time; walked without RW and reports feeling fatigued/tired in LEs. 1/4 DOE. Difficulty dual tasking while walking.    Stairs Stairs: Yes Stairs assistance: Supervision Stair Management: Alternating pattern;Step to pattern;Two rails Number of  Stairs: 5 General stair comments: Cues for technique/safety.    Wheelchair Mobility    Modified Rankin (Stroke Patients Only) Modified Rankin (Stroke Patients Only) Pre-Morbid Rankin Score: Slight disability Modified Rankin: Moderately severe disability     Balance Overall balance assessment: Needs assistance Sitting-balance support: Feet supported;No upper extremity supported Sitting balance-Leahy Scale: Good     Standing balance support: During functional activity Standing balance-Leahy Scale: Fair Standing balance comment: preference to UE support, furniture walking in room                             Cognition Arousal/Alertness: Awake/alert Behavior During Therapy: WFL for tasks assessed/performed Overall Cognitive Status: Impaired/Different from baseline Area of Impairment: Memory;Problem solving                     Memory: Decreased short-term memory       Problem Solving: Slow processing General Comments: Seems to be close to baseline per report.      Exercises      General Comments        Pertinent Vitals/Pain Pain Assessment: No/denies pain    Home Living                      Prior Function            PT Goals (current goals can now be found in the care plan section) Progress towards PT goals: Progressing toward goals    Frequency    Min 3X/week      PT Plan Current plan remains appropriate    Co-evaluation  AM-PAC PT "6 Clicks" Mobility   Outcome Measure  Help needed turning from your back to your side while in a flat bed without using bedrails?: A Little Help needed moving from lying on your back to sitting on the side of a flat bed without using bedrails?: A Little Help needed moving to and from a bed to a chair (including a wheelchair)?: A Little Help needed standing up from a chair using your arms (e.g., wheelchair or bedside chair)?: None Help needed to walk in hospital room?: A  Little Help needed climbing 3-5 steps with a railing? : A Little 6 Click Score: 19    End of Session Equipment Utilized During Treatment: Gait belt Activity Tolerance: Patient tolerated treatment well Patient left: in chair;with call bell/phone within reach(sitting in front of sink for bath , waiting for tech) Nurse Communication: Mobility status PT Visit Diagnosis: Muscle weakness (generalized) (M62.81);Unsteadiness on feet (R26.81)     Time: 5093-2671 PT Time Calculation (min) (ACUTE ONLY): 24 min  Charges:  $Gait Training: 8-22 mins $Therapeutic Activity: 8-22 mins                     Mylo Red, [place], DPT Acute Rehabilitation Services Pager [phone] Office [phone]       Blake Divine A Lanier Ensign 03/22/2018, 10:47 AM

## 2018-03-22 NOTE — Plan of Care (Signed)
  Problem: Education: Goal: Knowledge of General Education information will improve Description Including pain rating scale, medication(s)/side effects and non-pharmacologic comfort measures Outcome: Progressing Note:  POC reviewed with pt.   

## 2018-03-22 NOTE — Discharge Summary (Signed)
Physician Discharge Summary  Hannah Perez QIO:[id number] DOB: [birth year] DOA: 03/18/2018  PCP: Gweneth Dimitri, MD  Admit date: 03/18/2018 Discharge date: 03/22/2018  Time spent: 50 minutes  Recommendations for Outpatient Follow-up:  1. Follow-up with Gweneth Dimitri, MD in 1 to 2 weeks.  On follow-up patient needs a basic metabolic profile done to follow-up on electrolytes and renal function.  Patient also need vitamin B12 levels and folic acid levels checked in 1 month.  Patient noted to be vitamin B12 deficient as well as folate deficient and discharged on oral supplementation   Discharge Diagnoses:  Principal Problem:   Acute metabolic encephalopathy Active Problems:   E. coli UTI   Benign essential tremor   Hyperlipidemia   Acute lower UTI   Encephalopathy acute   Hypotension   Dehydration   Hypokalemia   Vitamin B12 deficiency   Folate deficiency   Anemia   Discharge Condition: Stable and improved  Diet recommendation: Regular  Filed Weights   03/18/18 1951  Weight: 66.2 kg    History of present illness:  Per Dr Prince Solian is a 68 y.o. female with medical history significant of essential tremors, hyperlipidemia and mild intermittent asthma presenting to the emergency room with confusion, brought by family.    On admission, patient was able to give her history.  According to the patient, she has mostly controlled chronic medical problems and never has to be hospitalized.  07-Mar-2024her father passed away in [place], she was very busy with different issues, she was having burning urination and dysuria for about a week but could not sought any help.  They came back home, went to urgent care and was diagnosed with UTI and Bactrim was prescribed.  Patient apparently took only 1 tablet a day for last 5 days.  Patient herself has improved urinary symptoms.  Denied any fever, chills, nausea, vomiting, chest pain, shortness of breath, abdominal pain or suprapubic  tenderness.  Denied any focal weaknesses.Her daughter noticed that last few days she had been very sleepy, weak and not active.  She was not eating very well.  Family was concerned about confusion.  They reported that earlier the day prior to admission, patient was out in her car and walked out of the car without stopping it.  She was also noted to be lost in the conversation at times.  Patient herself stated that she just felt weak otherwise denied any complaints.  Also had occasional shortness of breath. ED Course: In the emergency room, temperature 100.9, initial blood pressure was 90/51.  WBC count 13.7.  Creatinine 1.33, no baseline available.  Urinalysis with grossly infected urine.  Chest x-ray is normal. Her d-dimer was elevated, a CTA of the chest was done that was negative for PE, pneumonia.  She received 2 L of IV fluids in the ER after that she had some shortness of breath that has improved now.  Given a dose of Rocephin in the ER  Hospital Course:  1 acute metabolic encephalopathy Secondary to UTI which was present on admission in the setting of recent bereavement as well as vitamin B12 deficiency and folate deficiency.  Patient received oral Bactrim in the outpatient setting for UTI however stated took 1 tablet daily as opposed to 1 tablet twice daily.  Patient noted to have failed outpatient treatment.  Urine cultures with greater than 100,000 colonies of E. coli which was resistant to Bactrim.    Blood cultures were obtained with no  growth to date.  Patient initially placed on IV Rocephin and subsequently transition to oral Keflex once sensitivities have resulted.  Patient tolerated oral Keflex.  Patient be discharged home on 5 more days of oral Keflex to complete a course of antibiotic treatment.  Patient was also placed on vitamin B12 supplementation as well as folate supplementation.  Patient improved clinically.  Outpatient follow-up with PCP.    2.  E. coli UTI Resistant to Bactrim.   On admission patient placed on IV Rocephin.  Once urine cultures had resulted as well as sensitivities it was noted that patient's E. coli UTI was resistant to Bactrim which was on in the outpatient setting.  Patient was subsequently transitioned to oral Keflex which she tolerated and patient be discharged home on 5 more days of oral Keflex to complete a one-week course of antibiotic treatment.  Outpatient follow-up with PCP.   3.  Benign essential tremors Patient was maintained on home regimen of primidone and propranolol.   4.  Hyperlipidemia Patient maintained on a statin.   5.  Hypokalemia Patient noted to be hypokalemic on admission.  Potassium was repleted.  Hypokalemia had resolved by day of discharge.    6.  Anemia of chronic disease/B12 deficiency/folate deficiency Patient with no overt bleeding.  Likely dilutional in nature.  Hemoglobin stable at 9.2.  Patient denied any overt bleeding.  Anemia panel consistent with anemia of chronic disease and also with a folate deficiency and vitamin B12 deficiency.    Patient placed on vitamin B12 IM injections during the hospitalization as well as folate supplementation.  Patient be discharged home on oral vitamin B12 and folic acid oral supplementation.  Outpatient follow-up with PCP.   7.  Generalized weakness Patient with complaints of significant weakness.  Patient seen by PT/OT and recommended home health therapies..  8.  Dehydration Patient was hydrated with IV fluids and was euvolemic by day of discharge.   Procedures:  Chest x-ray 03/19/2018, 03/18/2018  CT angiogram chest 03/19/2018  Consultations:  None  Discharge Exam: Vitals:   03/22/18 0316 03/22/18 0809  BP: 132/73 138/72  Pulse: 88 87  Resp: 18 16  Temp: 98.4 F (36.9 C) 98.2 F (36.8 C)  SpO2: 96% 97%    General: NAD Cardiovascular: RRR Respiratory: CTAB  Discharge Instructions   Discharge Instructions    Diet general   Complete by:  As directed     Increase activity slowly   Complete by:  As directed      Allergies as of 03/22/2018      Reactions   Penicillins Itching   Did it involve swelling of the face/tongue/throat, SOB, or low BP? Unknown Did it involve sudden or severe rash/hives, skin peeling, or any reaction on the inside of your mouth or nose? Unknown Did you need to seek medical attention at a hospital or doctor's office? No When did it last happen?68 yrs old? If all above answers are "NO", may proceed with cephalosporin use.      Medication List    STOP taking these medications   sulfamethoxazole-trimethoprim 800-160 MG tablet Commonly known as:  BACTRIM DS,SEPTRA DS     TAKE these medications   CALCIUM 500 +D PO Take 1,000 mg by mouth daily.   cephALEXin 500 MG capsule Commonly known as:  KEFLEX Take 1 capsule (500 mg total) by mouth 3 (three) times daily for 5 days.   Crestor 20 MG tablet Generic drug:  rosuvastatin Take 20 mg by mouth at bedtime.  fenofibrate 54 MG tablet Take 54 mg by mouth at bedtime.   folic acid 1 MG tablet Commonly known as:  FOLVITE Take 1 tablet (1 mg total) by mouth daily. Start taking on:  March 23, 2018   ibandronate 150 MG tablet Commonly known as:  BONIVA Take 150 mg by mouth every 30 (thirty) days. Take in the morning with a full glass of water, on an empty stomach, and do not take anything else by mouth or lie down for the next 30 min.   naproxen sodium 220 MG tablet Commonly known as:  ALEVE Take 440 mg by mouth 2 (two) times daily as needed (pain).   primidone 250 MG tablet Commonly known as:  MYSOLINE Take 250 mg by mouth 2 (two) times daily.   propranolol ER 80 MG 24 hr capsule Commonly known as:  INDERAL LA Take 80 mg by mouth daily.   vitamin B-12 1000 MCG tablet Commonly known as:  CYANOCOBALAMIN Take 1 tablet (1,000 mcg total) by mouth daily.   Vitamin D-3 125 MCG (5000 UT) Tabs Take 5,000 Units by mouth daily.            Durable  Medical Equipment  (From admission, onward)         Start     Ordered   03/22/18 0839  For home use only DME 3 n 1  Once     03/22/18 0838   03/21/18 1142  For home use only DME 3 n 1  Once     03/21/18 1141   03/21/18 1140  For home use only DME Walker rolling  Once    Question:  Patient needs a walker to treat with the following condition  Answer:  Debility   03/21/18 1139         Allergies  Allergen Reactions  . Penicillins Itching    Did it involve swelling of the face/tongue/throat, SOB, or low BP? Unknown Did it involve sudden or severe rash/hives, skin peeling, or any reaction on the inside of your mouth or nose? Unknown Did you need to seek medical attention at a hospital or doctor's office? No When did it last happen?68 yrs old? If all above answers are "NO", may proceed with cephalosporin use.   Follow-up Information    Gweneth Dimitri, MD. Schedule an appointment as soon as possible for a visit in 2 week(s).   Specialty:  Family Medicine Why:  f/u in 1-2 weeks. Contact information: [address] [place] [address] [phone]            The results of significant diagnostics from this hospitalization (including imaging, microbiology, ancillary and laboratory) are listed below for reference.    Significant Diagnostic Studies: Dg Chest 2 View  Result Date: 03/19/2018 CLINICAL DATA:  Shortness of breath after intravenous fluid. History of asthma. EXAM: CHEST - 2 VIEW COMPARISON:  Chest radiograph March 18, 2018 FINDINGS: Cardiomediastinal silhouette is normal. Mildly calcified aortic arch. No pleural effusions or focal consolidations. Trachea projects midline and there is no pneumothorax. Soft tissue planes and included osseous structures are non-suspicious. Kyphosis, osteopenia multilevel vertebral body cement augmentation. IMPRESSION: 1. Stable examination, no acute cardiopulmonary process. Electronically Signed   By: Awilda Metro M.D.   On:  03/19/2018 02:56   Dg Chest 2 View  Result Date: 03/18/2018 CLINICAL DATA:  Confusion, increased work of breathing EXAM: CHEST - 2 VIEW COMPARISON:  12/31/2014, 05/07/2016 FINDINGS: No acute airspace disease or effusion. Normal heart size. No pneumothorax.  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 Stable multiple adjacent mild to moderate compression fracture deformities of the midthoracic spine. IMPRESSION: No active cardiopulmonary disease. Electronically Signed   By: Jasmine Pang M.D.   On: 03/18/2018 20:56   Ct Angio Chest Pe W And/or Wo Contrast  Result Date: 03/19/2018 CLINICAL DATA:  Shortness of breath for 3 weeks. Being treated for urinary tract infection. Tachypnea. Elevated D-dimer. EXAM: CT ANGIOGRAPHY CHEST WITH CONTRAST TECHNIQUE: Multidetector CT imaging of the chest was performed using the standard protocol during bolus administration of intravenous contrast. Multiplanar CT image reconstructions and MIPs were obtained to evaluate the vascular anatomy. CONTRAST:  71mL ISOVUE-370 IOPAMIDOL (ISOVUE-370) INJECTION 76% COMPARISON:  Chest radiograph of earlier in the day. No prior CT. FINDINGS: Cardiovascular: The quality of this exam for evaluation of pulmonary embolism is good. Minimal motion degradation involves the lung bases. No evidence of pulmonary embolism. Aortic and branch vessel atherosclerosis. Normal heart size, without pericardial effusion. Mediastinum/Nodes: No mediastinal or hilar adenopathy. Lungs/Pleura: Trace bilateral pleural fluid. Minimal dependent bibasilar atelectasis. Upper Abdomen: Normal imaged portions of the liver, spleen, stomach, pancreas, adrenal glands, kidneys. Abdominal aortic and branch vessel atherosclerosis. Musculoskeletal: Osteopenia. Multilevel thoracic compression deformities with multilevel vertebral augmentation. No ventral canal encroachment. Review of the MIP images confirms the above findings. IMPRESSION: 1. No evidence of  pulmonary embolism. 2. Trace bilateral pleural fluid. Aortic Atherosclerosis (ICD10-I70.0). Electronically Signed   By: Jeronimo Greaves M.D.   On: 03/19/2018 08:06    Microbiology: Recent Results (from the past 240 hour(s))  Urine culture     Status: Abnormal   Collection Time: 03/19/18 12:11 AM  Result Value Ref Range Status   Specimen Description URINE, CLEAN CATCH  Final   Special Requests   Final    NONE Performed at [facility], [address] [place], [place] [address]    Culture >=100,000 COLONIES/mL ESCHERICHIA COLI (A)  Final   Report Status 03/21/2018 FINAL  Final   Organism ID, Bacteria ESCHERICHIA COLI (A)  Final      Susceptibility   Escherichia coli - MIC*    AMPICILLIN >=32 RESISTANT Resistant     CEFAZOLIN <=4 SENSITIVE Sensitive     CEFTRIAXONE <=1 SENSITIVE Sensitive     CIPROFLOXACIN <=0.25 SENSITIVE Sensitive     GENTAMICIN <=1 SENSITIVE Sensitive     IMIPENEM <=0.25 SENSITIVE Sensitive     NITROFURANTOIN <=16 SENSITIVE Sensitive     TRIMETH/SULFA >=320 RESISTANT Resistant     AMPICILLIN/SULBACTAM 4 SENSITIVE Sensitive     PIP/TAZO <=4 SENSITIVE Sensitive     Extended ESBL NEGATIVE Sensitive     * >=100,000 COLONIES/mL ESCHERICHIA COLI  Blood culture (routine x 2)     Status: None (Preliminary result)   Collection Time: 03/19/18  3:23 AM  Result Value Ref Range Status   Specimen Description BLOOD RIGHT HAND  Final   Special Requests   Final    BOTTLES DRAWN AEROBIC AND ANAEROBIC Blood Culture adequate volume   Culture   Final    NO GROWTH 3 DAYS Performed at [facility], [address] [place], [place] [address]    Report Status PENDING  Incomplete  Blood culture (routine x 2)     Status: None (Preliminary result)   Collection Time: 03/19/18  3:48 AM  Result Value Ref Range Status   Specimen Description BLOOD LEFT HAND  Final   Special Requests   Final    BOTTLES DRAWN AEROBIC AND  ANAEROBIC Blood Culture results may not be optimal due to  an excessive volume of blood received in culture bottles   Culture   Final    NO GROWTH 3 DAYS Performed at [facility], [address], [place], [place] [address]    Report Status PENDING  Incomplete     Labs: Basic Metabolic Panel: Recent Labs  Lab 03/18/18 2037 03/20/18 1139 03/21/18 0516 03/22/18 0412  NA 134* 135 136 133*  K 3.2* 3.3* 3.5 3.5  CL 106 110 108 105  CO2 18* 20* 18* 20*  GLUCOSE 146* 117* 110* 109*  BUN 15 9 7* 8  CREATININE 1.33* 0.82 0.84 0.73  CALCIUM 8.4* 8.2* 8.0* 8.4*  MG  --  2.1  --   --    Liver Function Tests: Recent Labs  Lab 03/18/18 2037  AST 36  ALT 29  ALKPHOS 117  BILITOT 1.1  PROT 6.6  ALBUMIN 2.2*   No results for input(s): LIPASE, AMYLASE in the last 168 hours. Recent Labs  Lab 03/18/18 2037  AMMONIA 31   CBC: Recent Labs  Lab 03/18/18 2037 03/20/18 1139 03/21/18 0516 03/22/18 0412  WBC 13.7* 9.5 8.6 7.6  NEUTROABS 9.8* 6.6  --   --   HGB 9.9* 9.1* 9.5* 9.2*  HCT 30.7* 27.8* 29.3* 28.2*  MCV 95.3 95.2 94.5 93.7  PLT 287 354 379 374   Cardiac Enzymes: No results for input(s): CKTOTAL, CKMB, CKMBINDEX, TROPONINI in the last 168 hours. BNP: BNP (last 3 results) No results for input(s): BNP in the last 8760 hours.  ProBNP (last 3 results) No results for input(s): PROBNP in the last 8760 hours.  CBG: No results for input(s): GLUCAP in the last 168 hours.     Signed:  Ramiro Harvest MD.  [facility] 03/22/2018, 11:42 AM

## 2018-03-22 NOTE — Progress Notes (Signed)
Pt discharge education and instructions completed with pt and she denies any questions. Pt IV removed; home DME equipments delivered to pt at bedside; pt handed her prescription for Keflex; pt discharge home and transported off unit via wheelchair with belongings to the side. Dionne Bucy RN

## 2018-03-22 NOTE — Progress Notes (Signed)
Occupational Therapy Treatment Patient Details Name: Hannah Perez MRN: [id number] DOB: 1950-05-14 Today's Date: 03/22/2018    History of present illness Patient is a 68 y/o female who presents with AMS and weakness secondary to UTI and dehydration. PMH includes vertigo, essential tremor, osteoporosis, asthma.    OT comments  Patient progressing well.  Improved safety using RW for mobility and self care in room.  Educated on energy conservation techinques and tub transfers using 3:1, handouts provided.  Close supervision for simulated tub transfers.  Educated on use of 3:1 a shower chair.  Plans to dc home today.  HHOT remains appropriate.    Follow Up Recommendations  Home health OT;Supervision - Intermittent    Equipment Recommendations  3 in 1 bedside commode    Recommendations for Other Services      Precautions / Restrictions Precautions Precautions: Fall Restrictions Weight Bearing Restrictions: No       Mobility Bed Mobility Overal bed mobility: Modified Independent                Transfers Overall transfer level: Needs assistance Equipment used: Rolling walker (2 wheeled) Transfers: Sit to/from Stand Sit to Stand: Supervision         General transfer comment: supervision for safety    Balance Overall balance assessment: Needs assistance Sitting-balance support: Feet supported;No upper extremity supported Sitting balance-Leahy Scale: Good     Standing balance support: During functional activity Standing balance-Leahy Scale: Fair Standing balance comment: preference to UE support, supervsiion with RW                           ADL either performed or assessed with clinical judgement   ADL Overall ADL's : Needs assistance/impaired             Lower Body Bathing: Supervison/ safety;Sit to/from stand           Toilet Transfer: Supervision/safety;Ambulation;RW;BSC Toilet Transfer Details (indicate cue type and reason): reviewed  use of 3:1 for home use      Tub/ Shower Transfer: Tub transfer;Supervision/safety;Ambulation;3 in 1 Tub/Shower Transfer Details (indicate cue type and reason): provided handout and completed technique using reverse step over threshold, reports may use walk in shower downstairs first  Functional mobility during ADLs: Supervision/safety;Rolling walker General ADL Comments: supervision for safety with RW, reviewed energy conservation techniques and provided handout      Vision   Vision Assessment?: No apparent visual deficits   Perception     Praxis      Cognition Arousal/Alertness: Awake/alert Behavior During Therapy: WFL for tasks assessed/performed Overall Cognitive Status: Impaired/Different from baseline Area of Impairment: Memory;Problem solving                     Memory: Decreased short-term memory       Problem Solving: Slow processing General Comments: Seems to be close to baseline per report.        Exercises     Shoulder Instructions       General Comments      Pertinent Vitals/ Pain       Pain Assessment: No/denies pain  Home Living                                          Prior Functioning/Environment  Frequency  Min 2X/week        Progress Toward Goals  OT Goals(current goals can now be found in the care plan section)  Progress towards OT goals: Progressing toward goals  Acute Rehab OT Goals Patient Stated Goal: to get home  OT Goal Formulation: With patient Time For Goal Achievement: 04/04/18 Potential to Achieve Goals: Good  Plan Discharge plan remains appropriate;Frequency remains appropriate    Co-evaluation                 AM-PAC OT "6 Clicks" Daily Activity     Outcome Measure   Help from another person eating meals?: None Help from another person taking care of personal grooming?: None Help from another person toileting, which includes using toliet, bedpan, or urinal?:  None Help from another person bathing (including washing, rinsing, drying)?: A Little Help from another person to put on and taking off regular upper body clothing?: None Help from another person to put on and taking off regular lower body clothing?: A Little 6 Click Score: 22    End of Session Equipment Utilized During Treatment: Rolling walker  OT Visit Diagnosis: Unsteadiness on feet (R26.81);Muscle weakness (generalized) (M62.81)   Activity Tolerance Patient tolerated treatment well   Patient Left with call bell/phone within reach;in bed   Nurse Communication Mobility status        Time: 9470-9628 OT Time Calculation (min): 19 min  Charges: OT General Charges $OT Visit: 1 Visit OT Treatments $Self Care/Home Management : 8-22 mins  Chancy Milroy, OT Acute Rehabilitation Services Pager [phone] Office [phone]    Chancy Milroy 03/22/2018, 12:58 PM

## 2018-03-24 LAB — CULTURE, BLOOD (ROUTINE X 2)
Culture: NO GROWTH
Culture: NO GROWTH
Special Requests: ADEQUATE

## 2018-03-25 DIAGNOSIS — E7800 Pure hypercholesterolemia, unspecified: Secondary | ICD-10-CM | POA: Diagnosis not present

## 2018-03-25 DIAGNOSIS — D649 Anemia, unspecified: Secondary | ICD-10-CM | POA: Diagnosis not present

## 2018-03-25 DIAGNOSIS — E538 Deficiency of other specified B group vitamins: Secondary | ICD-10-CM | POA: Diagnosis not present

## 2018-03-25 DIAGNOSIS — B9620 Unspecified Escherichia coli [E. coli] as the cause of diseases classified elsewhere: Secondary | ICD-10-CM | POA: Diagnosis not present

## 2018-03-25 DIAGNOSIS — E876 Hypokalemia: Secondary | ICD-10-CM | POA: Diagnosis not present

## 2018-03-25 DIAGNOSIS — I10 Essential (primary) hypertension: Secondary | ICD-10-CM | POA: Diagnosis not present

## 2018-03-25 DIAGNOSIS — J4520 Mild intermittent asthma, uncomplicated: Secondary | ICD-10-CM | POA: Diagnosis not present

## 2018-03-25 DIAGNOSIS — N390 Urinary tract infection, site not specified: Secondary | ICD-10-CM | POA: Diagnosis not present

## 2018-03-25 DIAGNOSIS — G250 Essential tremor: Secondary | ICD-10-CM | POA: Diagnosis not present

## 2018-03-25 DIAGNOSIS — M810 Age-related osteoporosis without current pathological fracture: Secondary | ICD-10-CM | POA: Diagnosis not present

## 2018-03-25 DIAGNOSIS — Z9181 History of falling: Secondary | ICD-10-CM | POA: Diagnosis not present

## 2018-03-29 DIAGNOSIS — D649 Anemia, unspecified: Secondary | ICD-10-CM | POA: Diagnosis not present

## 2018-03-29 DIAGNOSIS — G250 Essential tremor: Secondary | ICD-10-CM | POA: Diagnosis not present

## 2018-03-29 DIAGNOSIS — N390 Urinary tract infection, site not specified: Secondary | ICD-10-CM | POA: Diagnosis not present

## 2018-03-29 DIAGNOSIS — B9620 Unspecified Escherichia coli [E. coli] as the cause of diseases classified elsewhere: Secondary | ICD-10-CM | POA: Diagnosis not present

## 2018-03-29 DIAGNOSIS — J4520 Mild intermittent asthma, uncomplicated: Secondary | ICD-10-CM | POA: Diagnosis not present

## 2018-03-29 DIAGNOSIS — I10 Essential (primary) hypertension: Secondary | ICD-10-CM | POA: Diagnosis not present

## 2018-04-01 DIAGNOSIS — G250 Essential tremor: Secondary | ICD-10-CM | POA: Diagnosis not present

## 2018-04-01 DIAGNOSIS — I10 Essential (primary) hypertension: Secondary | ICD-10-CM | POA: Diagnosis not present

## 2018-04-01 DIAGNOSIS — J4520 Mild intermittent asthma, uncomplicated: Secondary | ICD-10-CM | POA: Diagnosis not present

## 2018-04-01 DIAGNOSIS — D649 Anemia, unspecified: Secondary | ICD-10-CM | POA: Diagnosis not present

## 2018-04-01 DIAGNOSIS — N390 Urinary tract infection, site not specified: Secondary | ICD-10-CM | POA: Diagnosis not present

## 2018-04-01 DIAGNOSIS — B9620 Unspecified Escherichia coli [E. coli] as the cause of diseases classified elsewhere: Secondary | ICD-10-CM | POA: Diagnosis not present

## 2018-04-04 DIAGNOSIS — I10 Essential (primary) hypertension: Secondary | ICD-10-CM | POA: Diagnosis not present

## 2018-04-04 DIAGNOSIS — J4520 Mild intermittent asthma, uncomplicated: Secondary | ICD-10-CM | POA: Diagnosis not present

## 2018-04-04 DIAGNOSIS — D649 Anemia, unspecified: Secondary | ICD-10-CM | POA: Diagnosis not present

## 2018-04-04 DIAGNOSIS — N390 Urinary tract infection, site not specified: Secondary | ICD-10-CM | POA: Diagnosis not present

## 2018-04-04 DIAGNOSIS — G250 Essential tremor: Secondary | ICD-10-CM | POA: Diagnosis not present

## 2018-04-04 DIAGNOSIS — B9620 Unspecified Escherichia coli [E. coli] as the cause of diseases classified elsewhere: Secondary | ICD-10-CM | POA: Diagnosis not present

## 2018-04-05 DIAGNOSIS — B9620 Unspecified Escherichia coli [E. coli] as the cause of diseases classified elsewhere: Secondary | ICD-10-CM | POA: Diagnosis not present

## 2018-04-05 DIAGNOSIS — M810 Age-related osteoporosis without current pathological fracture: Secondary | ICD-10-CM | POA: Diagnosis not present

## 2018-04-05 DIAGNOSIS — N390 Urinary tract infection, site not specified: Secondary | ICD-10-CM | POA: Diagnosis not present

## 2018-04-05 DIAGNOSIS — E785 Hyperlipidemia, unspecified: Secondary | ICD-10-CM | POA: Diagnosis not present

## 2018-04-05 DIAGNOSIS — D6489 Other specified anemias: Secondary | ICD-10-CM | POA: Diagnosis not present

## 2018-04-05 DIAGNOSIS — R251 Tremor, unspecified: Secondary | ICD-10-CM | POA: Diagnosis not present

## 2018-04-05 DIAGNOSIS — G9341 Metabolic encephalopathy: Secondary | ICD-10-CM | POA: Diagnosis not present

## 2018-04-08 DIAGNOSIS — R300 Dysuria: Secondary | ICD-10-CM | POA: Diagnosis not present

## 2018-04-19 DIAGNOSIS — J4520 Mild intermittent asthma, uncomplicated: Secondary | ICD-10-CM | POA: Diagnosis not present

## 2018-04-19 DIAGNOSIS — I10 Essential (primary) hypertension: Secondary | ICD-10-CM | POA: Diagnosis not present

## 2018-04-19 DIAGNOSIS — G250 Essential tremor: Secondary | ICD-10-CM | POA: Diagnosis not present

## 2018-04-19 DIAGNOSIS — D649 Anemia, unspecified: Secondary | ICD-10-CM | POA: Diagnosis not present

## 2018-04-19 DIAGNOSIS — B9620 Unspecified Escherichia coli [E. coli] as the cause of diseases classified elsewhere: Secondary | ICD-10-CM | POA: Diagnosis not present

## 2018-04-19 DIAGNOSIS — N390 Urinary tract infection, site not specified: Secondary | ICD-10-CM | POA: Diagnosis not present

## 2018-04-21 DIAGNOSIS — I10 Essential (primary) hypertension: Secondary | ICD-10-CM | POA: Diagnosis not present

## 2018-04-21 DIAGNOSIS — B9620 Unspecified Escherichia coli [E. coli] as the cause of diseases classified elsewhere: Secondary | ICD-10-CM | POA: Diagnosis not present

## 2018-04-21 DIAGNOSIS — G250 Essential tremor: Secondary | ICD-10-CM | POA: Diagnosis not present

## 2018-04-21 DIAGNOSIS — D649 Anemia, unspecified: Secondary | ICD-10-CM | POA: Diagnosis not present

## 2018-04-21 DIAGNOSIS — N390 Urinary tract infection, site not specified: Secondary | ICD-10-CM | POA: Diagnosis not present

## 2018-04-21 DIAGNOSIS — J4520 Mild intermittent asthma, uncomplicated: Secondary | ICD-10-CM | POA: Diagnosis not present

## 2018-04-24 DIAGNOSIS — M810 Age-related osteoporosis without current pathological fracture: Secondary | ICD-10-CM | POA: Diagnosis not present

## 2018-04-24 DIAGNOSIS — Z9181 History of falling: Secondary | ICD-10-CM | POA: Diagnosis not present

## 2018-04-24 DIAGNOSIS — I10 Essential (primary) hypertension: Secondary | ICD-10-CM | POA: Diagnosis not present

## 2018-04-24 DIAGNOSIS — G250 Essential tremor: Secondary | ICD-10-CM | POA: Diagnosis not present

## 2018-04-24 DIAGNOSIS — E538 Deficiency of other specified B group vitamins: Secondary | ICD-10-CM | POA: Diagnosis not present

## 2018-04-24 DIAGNOSIS — J4520 Mild intermittent asthma, uncomplicated: Secondary | ICD-10-CM | POA: Diagnosis not present

## 2018-04-24 DIAGNOSIS — E876 Hypokalemia: Secondary | ICD-10-CM | POA: Diagnosis not present

## 2018-04-24 DIAGNOSIS — N390 Urinary tract infection, site not specified: Secondary | ICD-10-CM | POA: Diagnosis not present

## 2018-04-24 DIAGNOSIS — E7800 Pure hypercholesterolemia, unspecified: Secondary | ICD-10-CM | POA: Diagnosis not present

## 2018-04-24 DIAGNOSIS — D649 Anemia, unspecified: Secondary | ICD-10-CM | POA: Diagnosis not present

## 2018-04-24 DIAGNOSIS — B9620 Unspecified Escherichia coli [E. coli] as the cause of diseases classified elsewhere: Secondary | ICD-10-CM | POA: Diagnosis not present

## 2018-04-27 DIAGNOSIS — J4520 Mild intermittent asthma, uncomplicated: Secondary | ICD-10-CM | POA: Diagnosis not present

## 2018-04-27 DIAGNOSIS — G250 Essential tremor: Secondary | ICD-10-CM | POA: Diagnosis not present

## 2018-04-27 DIAGNOSIS — D649 Anemia, unspecified: Secondary | ICD-10-CM | POA: Diagnosis not present

## 2018-04-27 DIAGNOSIS — I10 Essential (primary) hypertension: Secondary | ICD-10-CM | POA: Diagnosis not present

## 2018-04-27 DIAGNOSIS — B9620 Unspecified Escherichia coli [E. coli] as the cause of diseases classified elsewhere: Secondary | ICD-10-CM | POA: Diagnosis not present

## 2018-04-27 DIAGNOSIS — N390 Urinary tract infection, site not specified: Secondary | ICD-10-CM | POA: Diagnosis not present

## 2018-07-29 DIAGNOSIS — D6489 Other specified anemias: Secondary | ICD-10-CM | POA: Diagnosis not present

## 2018-07-29 DIAGNOSIS — R251 Tremor, unspecified: Secondary | ICD-10-CM | POA: Diagnosis not present

## 2018-07-29 DIAGNOSIS — R2689 Other abnormalities of gait and mobility: Secondary | ICD-10-CM | POA: Diagnosis not present

## 2018-07-29 DIAGNOSIS — E785 Hyperlipidemia, unspecified: Secondary | ICD-10-CM | POA: Diagnosis not present

## 2018-07-29 DIAGNOSIS — M810 Age-related osteoporosis without current pathological fracture: Secondary | ICD-10-CM | POA: Diagnosis not present

## 2018-08-01 DIAGNOSIS — Z5181 Encounter for therapeutic drug level monitoring: Secondary | ICD-10-CM | POA: Diagnosis not present

## 2018-08-01 DIAGNOSIS — R946 Abnormal results of thyroid function studies: Secondary | ICD-10-CM | POA: Diagnosis not present

## 2018-08-01 DIAGNOSIS — Z8262 Family history of osteoporosis: Secondary | ICD-10-CM | POA: Diagnosis not present

## 2018-08-01 DIAGNOSIS — M810 Age-related osteoporosis without current pathological fracture: Secondary | ICD-10-CM | POA: Diagnosis not present

## 2018-08-01 DIAGNOSIS — Z8781 Personal history of (healed) traumatic fracture: Secondary | ICD-10-CM | POA: Diagnosis not present

## 2018-08-03 DIAGNOSIS — H04123 Dry eye syndrome of bilateral lacrimal glands: Secondary | ICD-10-CM | POA: Diagnosis not present

## 2018-08-03 DIAGNOSIS — H26492 Other secondary cataract, left eye: Secondary | ICD-10-CM | POA: Diagnosis not present

## 2018-08-03 DIAGNOSIS — H52203 Unspecified astigmatism, bilateral: Secondary | ICD-10-CM | POA: Diagnosis not present

## 2018-08-03 DIAGNOSIS — H524 Presbyopia: Secondary | ICD-10-CM | POA: Diagnosis not present

## 2018-08-17 DIAGNOSIS — R251 Tremor, unspecified: Secondary | ICD-10-CM | POA: Diagnosis not present

## 2018-08-17 DIAGNOSIS — R2689 Other abnormalities of gait and mobility: Secondary | ICD-10-CM | POA: Diagnosis not present

## 2018-08-17 DIAGNOSIS — Z5181 Encounter for therapeutic drug level monitoring: Secondary | ICD-10-CM | POA: Diagnosis not present

## 2018-08-17 DIAGNOSIS — B9620 Unspecified Escherichia coli [E. coli] as the cause of diseases classified elsewhere: Secondary | ICD-10-CM | POA: Diagnosis not present

## 2018-08-17 DIAGNOSIS — E785 Hyperlipidemia, unspecified: Secondary | ICD-10-CM | POA: Diagnosis not present

## 2018-08-17 DIAGNOSIS — R946 Abnormal results of thyroid function studies: Secondary | ICD-10-CM | POA: Diagnosis not present

## 2018-08-17 DIAGNOSIS — G9341 Metabolic encephalopathy: Secondary | ICD-10-CM | POA: Diagnosis not present

## 2018-08-17 DIAGNOSIS — D649 Anemia, unspecified: Secondary | ICD-10-CM | POA: Diagnosis not present

## 2018-08-17 DIAGNOSIS — D6489 Other specified anemias: Secondary | ICD-10-CM | POA: Diagnosis not present

## 2018-08-17 DIAGNOSIS — Z8262 Family history of osteoporosis: Secondary | ICD-10-CM | POA: Diagnosis not present

## 2018-08-17 DIAGNOSIS — N390 Urinary tract infection, site not specified: Secondary | ICD-10-CM | POA: Diagnosis not present

## 2018-08-17 DIAGNOSIS — M810 Age-related osteoporosis without current pathological fracture: Secondary | ICD-10-CM | POA: Diagnosis not present

## 2018-08-17 DIAGNOSIS — R300 Dysuria: Secondary | ICD-10-CM | POA: Diagnosis not present

## 2018-08-17 DIAGNOSIS — Z79899 Other long term (current) drug therapy: Secondary | ICD-10-CM | POA: Diagnosis not present

## 2018-10-20 DIAGNOSIS — Z23 Encounter for immunization: Secondary | ICD-10-CM | POA: Diagnosis not present

## 2018-11-11 DIAGNOSIS — E785 Hyperlipidemia, unspecified: Secondary | ICD-10-CM | POA: Diagnosis not present

## 2018-11-11 DIAGNOSIS — F4321 Adjustment disorder with depressed mood: Secondary | ICD-10-CM | POA: Diagnosis not present

## 2018-11-11 DIAGNOSIS — M810 Age-related osteoporosis without current pathological fracture: Secondary | ICD-10-CM | POA: Diagnosis not present

## 2018-11-22 DIAGNOSIS — M810 Age-related osteoporosis without current pathological fracture: Secondary | ICD-10-CM | POA: Diagnosis not present

## 2018-11-22 DIAGNOSIS — E785 Hyperlipidemia, unspecified: Secondary | ICD-10-CM | POA: Diagnosis not present

## 2018-11-22 DIAGNOSIS — R7989 Other specified abnormal findings of blood chemistry: Secondary | ICD-10-CM | POA: Diagnosis not present

## 2018-11-22 DIAGNOSIS — R946 Abnormal results of thyroid function studies: Secondary | ICD-10-CM | POA: Diagnosis not present

## 2019-02-09 DIAGNOSIS — Z1389 Encounter for screening for other disorder: Secondary | ICD-10-CM | POA: Diagnosis not present

## 2019-02-09 DIAGNOSIS — M810 Age-related osteoporosis without current pathological fracture: Secondary | ICD-10-CM | POA: Diagnosis not present

## 2019-02-09 DIAGNOSIS — Z79899 Other long term (current) drug therapy: Secondary | ICD-10-CM | POA: Diagnosis not present

## 2019-02-09 DIAGNOSIS — R251 Tremor, unspecified: Secondary | ICD-10-CM | POA: Diagnosis not present

## 2019-02-09 DIAGNOSIS — Z1159 Encounter for screening for other viral diseases: Secondary | ICD-10-CM | POA: Diagnosis not present

## 2019-02-09 DIAGNOSIS — R7989 Other specified abnormal findings of blood chemistry: Secondary | ICD-10-CM | POA: Diagnosis not present

## 2019-02-09 DIAGNOSIS — E785 Hyperlipidemia, unspecified: Secondary | ICD-10-CM | POA: Diagnosis not present

## 2019-02-09 DIAGNOSIS — Z0000 Encounter for general adult medical examination without abnormal findings: Secondary | ICD-10-CM | POA: Diagnosis not present

## 2019-02-09 DIAGNOSIS — Z1231 Encounter for screening mammogram for malignant neoplasm of breast: Secondary | ICD-10-CM | POA: Diagnosis not present

## 2019-02-10 ENCOUNTER — Other Ambulatory Visit: Payer: Self-pay | Admitting: Family Medicine

## 2019-02-10 DIAGNOSIS — M810 Age-related osteoporosis without current pathological fracture: Secondary | ICD-10-CM

## 2019-02-23 ENCOUNTER — Other Ambulatory Visit: Payer: Self-pay | Admitting: Family Medicine

## 2019-02-23 DIAGNOSIS — Z1231 Encounter for screening mammogram for malignant neoplasm of breast: Secondary | ICD-10-CM

## 2019-02-28 DIAGNOSIS — M810 Age-related osteoporosis without current pathological fracture: Secondary | ICD-10-CM | POA: Diagnosis not present

## 2019-02-28 DIAGNOSIS — Z0000 Encounter for general adult medical examination without abnormal findings: Secondary | ICD-10-CM | POA: Diagnosis not present

## 2019-02-28 DIAGNOSIS — Z1159 Encounter for screening for other viral diseases: Secondary | ICD-10-CM | POA: Diagnosis not present

## 2019-02-28 DIAGNOSIS — Z1389 Encounter for screening for other disorder: Secondary | ICD-10-CM | POA: Diagnosis not present

## 2019-02-28 DIAGNOSIS — Z79899 Other long term (current) drug therapy: Secondary | ICD-10-CM | POA: Diagnosis not present

## 2019-02-28 DIAGNOSIS — E538 Deficiency of other specified B group vitamins: Secondary | ICD-10-CM | POA: Diagnosis not present

## 2019-02-28 DIAGNOSIS — Z1231 Encounter for screening mammogram for malignant neoplasm of breast: Secondary | ICD-10-CM | POA: Diagnosis not present

## 2019-03-05 ENCOUNTER — Ambulatory Visit: Payer: Medicare Other | Attending: Internal Medicine

## 2019-03-05 DIAGNOSIS — Z23 Encounter for immunization: Secondary | ICD-10-CM | POA: Insufficient documentation

## 2019-03-05 NOTE — Progress Notes (Signed)
   Covid-19 Vaccination Clinic  Name:  SERENITIE VINTON    MRN: [id number] DOB: Jun 10, 1950  03/05/2019  Ms. Bloch was observed post Covid-19 immunization for 15 minutes without incidence. She was provided with Vaccine Information Sheet and instruction to access the V-Safe system.   Ms. Fojtik was instructed to call 911 with any severe reactions post vaccine: Marland Kitchen Difficulty breathing  . Swelling of your face and throat  . A fast heartbeat  . A bad rash all over your body  . Dizziness and weakness    Immunizations Administered    Name Date Dose VIS Date Route   Pfizer COVID-19 Vaccine 03/05/2019  2:06 PM 0.3 mL 12/23/2018 Intramuscular   Manufacturer: [employer], [employer]   Lot: [id number]   NDC: [address]-1

## 2019-03-29 ENCOUNTER — Ambulatory Visit: Payer: Medicare Other | Attending: Internal Medicine

## 2019-03-29 DIAGNOSIS — Z23 Encounter for immunization: Secondary | ICD-10-CM

## 2019-03-29 NOTE — Progress Notes (Signed)
   Covid-19 Vaccination Clinic  Name:  Hannah Perez    MRN: [id number] DOB: 11-09-1950  03/29/2019  Hannah Perez was observed post Covid-19 immunization for 15 minutes without incident. She was provided with Vaccine Information Sheet and instruction to access the V-Safe system.   Hannah Perez was instructed to call 911 with any severe reactions post vaccine: Marland Kitchen Difficulty breathing  . Swelling of face and throat  . A fast heartbeat  . A bad rash all over body  . Dizziness and weakness   Immunizations Administered    Name Date Dose VIS Date Route   Pfizer COVID-19 Vaccine 03/29/2019  2:00 PM 0.3 mL 12/23/2018 Intramuscular   Manufacturer: [employer], [employer]   Lot: [id number]   NDC: [address]-7

## 2019-07-11 ENCOUNTER — Ambulatory Visit
Admission: RE | Admit: 2019-07-11 | Discharge: 2019-07-11 | Disposition: A | Discharge status: Home / Self Care | Payer: Medicare Other | Source: Ambulatory Visit | Attending: Family Medicine | Admitting: Family Medicine

## 2019-07-11 ENCOUNTER — Other Ambulatory Visit: Payer: Self-pay

## 2019-07-11 DIAGNOSIS — M810 Age-related osteoporosis without current pathological fracture: Secondary | ICD-10-CM

## 2019-07-11 DIAGNOSIS — Z1231 Encounter for screening mammogram for malignant neoplasm of breast: Secondary | ICD-10-CM

## 2019-08-16 DIAGNOSIS — Z0000 Encounter for general adult medical examination without abnormal findings: Secondary | ICD-10-CM | POA: Diagnosis not present

## 2019-08-16 DIAGNOSIS — Z1389 Encounter for screening for other disorder: Secondary | ICD-10-CM | POA: Diagnosis not present

## 2019-08-16 DIAGNOSIS — Z79899 Other long term (current) drug therapy: Secondary | ICD-10-CM | POA: Diagnosis not present

## 2019-08-16 DIAGNOSIS — E785 Hyperlipidemia, unspecified: Secondary | ICD-10-CM | POA: Diagnosis not present

## 2019-08-16 DIAGNOSIS — E538 Deficiency of other specified B group vitamins: Secondary | ICD-10-CM | POA: Diagnosis not present

## 2019-08-16 DIAGNOSIS — Z1159 Encounter for screening for other viral diseases: Secondary | ICD-10-CM | POA: Diagnosis not present

## 2019-08-16 DIAGNOSIS — M810 Age-related osteoporosis without current pathological fracture: Secondary | ICD-10-CM | POA: Diagnosis not present

## 2019-08-16 DIAGNOSIS — R946 Abnormal results of thyroid function studies: Secondary | ICD-10-CM | POA: Diagnosis not present

## 2019-08-21 DIAGNOSIS — R251 Tremor, unspecified: Secondary | ICD-10-CM | POA: Diagnosis not present

## 2019-08-21 DIAGNOSIS — Z79899 Other long term (current) drug therapy: Secondary | ICD-10-CM | POA: Diagnosis not present

## 2019-08-21 DIAGNOSIS — M810 Age-related osteoporosis without current pathological fracture: Secondary | ICD-10-CM | POA: Diagnosis not present

## 2019-08-21 DIAGNOSIS — R748 Abnormal levels of other serum enzymes: Secondary | ICD-10-CM | POA: Diagnosis not present

## 2019-08-21 DIAGNOSIS — E785 Hyperlipidemia, unspecified: Secondary | ICD-10-CM | POA: Diagnosis not present

## 2019-08-29 DIAGNOSIS — Z87891 Personal history of nicotine dependence: Secondary | ICD-10-CM | POA: Diagnosis not present

## 2019-08-29 DIAGNOSIS — Z8781 Personal history of (healed) traumatic fracture: Secondary | ICD-10-CM | POA: Diagnosis not present

## 2019-08-29 DIAGNOSIS — M810 Age-related osteoporosis without current pathological fracture: Secondary | ICD-10-CM | POA: Diagnosis not present

## 2019-08-29 DIAGNOSIS — Z8262 Family history of osteoporosis: Secondary | ICD-10-CM | POA: Diagnosis not present

## 2019-08-31 DIAGNOSIS — F4321 Adjustment disorder with depressed mood: Secondary | ICD-10-CM | POA: Diagnosis not present

## 2019-08-31 DIAGNOSIS — M810 Age-related osteoporosis without current pathological fracture: Secondary | ICD-10-CM | POA: Diagnosis not present

## 2019-08-31 DIAGNOSIS — E785 Hyperlipidemia, unspecified: Secondary | ICD-10-CM | POA: Diagnosis not present

## 2019-09-05 DIAGNOSIS — H26492 Other secondary cataract, left eye: Secondary | ICD-10-CM | POA: Diagnosis not present

## 2019-09-05 DIAGNOSIS — H43813 Vitreous degeneration, bilateral: Secondary | ICD-10-CM | POA: Diagnosis not present

## 2019-09-05 DIAGNOSIS — H524 Presbyopia: Secondary | ICD-10-CM | POA: Diagnosis not present

## 2019-09-05 DIAGNOSIS — Z961 Presence of intraocular lens: Secondary | ICD-10-CM | POA: Diagnosis not present

## 2019-12-02 DIAGNOSIS — Z23 Encounter for immunization: Secondary | ICD-10-CM | POA: Diagnosis not present

## 2019-12-18 DIAGNOSIS — Z23 Encounter for immunization: Secondary | ICD-10-CM | POA: Diagnosis not present

## 2020-02-12 DIAGNOSIS — M810 Age-related osteoporosis without current pathological fracture: Secondary | ICD-10-CM | POA: Diagnosis not present

## 2020-02-12 DIAGNOSIS — R251 Tremor, unspecified: Secondary | ICD-10-CM | POA: Diagnosis not present

## 2020-02-12 DIAGNOSIS — R748 Abnormal levels of other serum enzymes: Secondary | ICD-10-CM | POA: Diagnosis not present

## 2020-02-12 DIAGNOSIS — E785 Hyperlipidemia, unspecified: Secondary | ICD-10-CM | POA: Diagnosis not present

## 2020-02-12 DIAGNOSIS — Z79899 Other long term (current) drug therapy: Secondary | ICD-10-CM | POA: Diagnosis not present

## 2020-02-16 DIAGNOSIS — Z79899 Other long term (current) drug therapy: Secondary | ICD-10-CM | POA: Diagnosis not present

## 2020-02-16 DIAGNOSIS — E785 Hyperlipidemia, unspecified: Secondary | ICD-10-CM | POA: Diagnosis not present

## 2020-02-16 DIAGNOSIS — R2689 Other abnormalities of gait and mobility: Secondary | ICD-10-CM | POA: Diagnosis not present

## 2020-02-16 DIAGNOSIS — R251 Tremor, unspecified: Secondary | ICD-10-CM | POA: Diagnosis not present

## 2020-02-16 DIAGNOSIS — Z01419 Encounter for gynecological examination (general) (routine) without abnormal findings: Secondary | ICD-10-CM | POA: Diagnosis not present

## 2020-02-16 DIAGNOSIS — Z1389 Encounter for screening for other disorder: Secondary | ICD-10-CM | POA: Diagnosis not present

## 2020-02-16 DIAGNOSIS — M7502 Adhesive capsulitis of left shoulder: Secondary | ICD-10-CM | POA: Diagnosis not present

## 2020-02-16 DIAGNOSIS — R7989 Other specified abnormal findings of blood chemistry: Secondary | ICD-10-CM | POA: Diagnosis not present

## 2020-02-16 DIAGNOSIS — Z0000 Encounter for general adult medical examination without abnormal findings: Secondary | ICD-10-CM | POA: Diagnosis not present

## 2020-02-16 DIAGNOSIS — M810 Age-related osteoporosis without current pathological fracture: Secondary | ICD-10-CM | POA: Diagnosis not present

## 2020-03-07 ENCOUNTER — Ambulatory Visit: Payer: Medicare Other | Attending: Family Medicine | Admitting: Physical Therapy

## 2020-03-07 ENCOUNTER — Other Ambulatory Visit: Payer: Self-pay

## 2020-03-07 DIAGNOSIS — M25512 Pain in left shoulder: Secondary | ICD-10-CM | POA: Diagnosis not present

## 2020-03-07 DIAGNOSIS — G8929 Other chronic pain: Secondary | ICD-10-CM | POA: Diagnosis not present

## 2020-03-07 DIAGNOSIS — R293 Abnormal posture: Secondary | ICD-10-CM | POA: Insufficient documentation

## 2020-03-07 DIAGNOSIS — M546 Pain in thoracic spine: Secondary | ICD-10-CM | POA: Insufficient documentation

## 2020-03-07 DIAGNOSIS — M25612 Stiffness of left shoulder, not elsewhere classified: Secondary | ICD-10-CM

## 2020-03-07 NOTE — Patient Instructions (Signed)
Access Code: [email or web address] URL: [email or web address] Date: 03/07/2020 Prepared by: Rosana Hoes  Exercises  Supine Shoulder Flexion Extension AAROM with Dowel - 2 x daily - 7 x weekly - 1-2 sets - 10 reps Supine Shoulder External Rotation with Dowel - 2 x daily - 7 x weekly - 1-2 sets - 10 reps Standing Shoulder Abduction AAROM with Dowel - 2 x daily - 7 x weekly - 1-2 sets - 10 reps

## 2020-03-08 ENCOUNTER — Encounter: Payer: Self-pay | Admitting: Physical Therapy

## 2020-03-08 NOTE — Therapy (Addendum)
[facility] Outpatient Rehabilitation [facility] [address] [place], [place], [address] Phone: [phone]   Fax:  [phone]  Physical Therapy Evaluation  Patient Details  Name: Hannah Perez MRN: [id number] Date of Birth: 1950-07-08 Referring Provider (PT): Gweneth Dimitri, MD   Encounter Date: 03/07/2020   PT End of Session - 03/07/20 1732     Visit Number 1    Number of Visits 16    Date for PT Re-Evaluation 05/02/20    Authorization Type MCR/BCBS    Authorization Time Period FOTO by 6th, KX by 15th    Progress Note Due on Visit 10    PT Start Time 1532    PT Stop Time 1622    PT Time Calculation (min) 50 min    Activity Tolerance Patient tolerated treatment well    Behavior During Therapy [facility] for tasks assessed/performed             Past Medical History:  Diagnosis Date   Asthma    trigger from dog hair, dust , grass   Benign essential tremor    Folate deficiency 03/20/2018   Hypercholesteremia    Osteoporosis 2009   on forteo   Vertigo    Vitamin B12 deficiency 03/20/2018    Past Surgical History:  Procedure Laterality Date   CATARACT EXTRACTION Left 04/2012   KYPHOPLASTY     KYPHOPLASTY N/A 02/15/2015   Procedure: Thoracic eight Kyphoplasty;  Surgeon: Barnett Abu, MD;  Location: [facility];  Service: Neurosurgery;  Laterality: N/A;  T8 Kyphoplasty   TONSILLECTOMY     TUBAL LIGATION      There were no vitals filed for this visit.    Subjective Assessment - 03/07/20 1532     Subjective Doctor told her she had a frozen shoulder, I've had it for about a couple of months I think, but recently just got diagnosed. I have severe osteoporosis in my thoracic spine and have had several kyphoplasties (mostly around T7-T8). My last kyphoplasty was about 2017. Also states that she has a fear of falling.    Limitations House hold activities;Lifting    How long can you sit comfortably? not bad    How long can you stand comfortably? 5 minutes (taking  a shower time)    How long can you walk comfortably? grocery store shopping    Patient Stated Goals 'not hurt', move it normally    Currently in Pain? Yes    Pain Score 2    10/10 at worst   Pain Location Shoulder    Pain Orientation Anterior   near [employer] joint, anterior, and L bicep   Pain Descriptors / Indicators Sharp    Pain Type Acute pain    Pain Onset More than a month ago    Pain Frequency Intermittent    Aggravating Factors  putting clothes/bra, lifting, reaching overhead, sleeping on side/ turning    Pain Relieving Factors moving it around after pain, just waiting for it to go away    Multiple Pain Sites Yes    Pain Score 3   can get up to 8/10   Pain Location Thoracic    Pain Orientation Mid    Pain Descriptors / Indicators Other (Comment)   'hurts'   Pain Type Chronic pain    Pain Onset More than a month ago    Pain Frequency Constant                  [facility] PT Assessment - 03/08/20  0001       Assessment   Medical Diagnosis Adhesive capsulitis of left shoulder and chronic thoracic back pain due to osteoarthritis    Referring Provider (PT) Gweneth DimitriMcNeill, Wendy, MD    Prior Therapy 2020 balance      Precautions   Precautions None      Restrictions   Weight Bearing Restrictions No      Balance Screen   Has the patient fallen in the past 6 months No    Has the patient had a decrease in activity level because of a fear of falling?  Yes    Is the patient reluctant to leave their home because of a fear of falling?  No      Home [employer] managernvironment   Living Environment Private residence    Home Access Stairs to enter    Entrance Stairs-Number of Steps 6    Entrance Stairs-Rails Can reach both    Home Layout Two level      Prior Function   Level of Independence Independent   just cant carry   Vocation Retired      Observation/Other Assessments   Observations severe kyphosis, resting postural tremor    Focus on Therapeutic Outcomes (FOTO)  unable to do this session due to  time constraints, will perform next sessoin      AROM   AROM Assessment Site Cervical;Shoulder;Lumbar    Right/Left Shoulder Right;Left    Right Shoulder Extension --   WNL   Right Shoulder Flexion 145 Degrees    Right Shoulder ABduction 148 Degrees    Right Shoulder Internal Rotation 90 Degrees    Right Shoulder External Rotation 53 Degrees    Left Shoulder Extension --   50% limited compared to R   Left Shoulder Flexion 133 Degrees    Left Shoulder ABduction 95 Degrees    Left Shoulder Internal Rotation 90 Degrees   in scaption   Left Shoulder External Rotation 25 Degrees    Cervical Flexion WNL    Cervical Extension 75% limited    Cervical - Right Side Bend 25% limited    Cervical - Left Side Bend 25% limited    Cervical - Right Rotation WNL    Cervical - Left Rotation WNL    Lumbar Flexion to knees    Lumbar Extension 25% limited    Lumbar - Right Side Bend right above mid joint line    Lumbar - Left Side Bend right above mid joint lin    Lumbar - Right Rotation WNL    Lumbar - Left Rotation WNL      PROM   PROM Assessment Site Shoulder    Right/Left Shoulder Right;Left    Right Shoulder Flexion 146 Degrees    Right Shoulder ABduction 148 Degrees    Right Shoulder Internal Rotation 90 Degrees    Right Shoulder External Rotation 64 Degrees    Left Shoulder Flexion 136 Degrees    Left Shoulder ABduction 97 Degrees    Left Shoulder Internal Rotation 90 Degrees    Left Shoulder External Rotation 27 Degrees      Strength   Overall Strength Deficits;Due to pain    Overall Strength Comments L shoulder painful with all MMTs    Strength Assessment Site Cervical;Shoulder    Right/Left Shoulder Right;Left    Right Shoulder Flexion 5/5    Right Shoulder Extension 5/5    Right Shoulder Internal Rotation 5/5    Right Shoulder External Rotation 5/5    Left Shoulder  Flexion 4-/5    Left Shoulder Extension 4-/5    Left Shoulder ABduction --   unable to assess due to pain   Left  Shoulder Internal Rotation 4/5    Left Shoulder External Rotation 4-/5    Cervical Flexion 5/5    Cervical Extension 5/5    Cervical - Right Side Bend 5/5    Cervical - Left Side Bend 5/5    Cervical - Right Rotation 5/5    Cervical - Left Rotation 5/5      Palpation   Palpation comment L AP hypomobility of glenohumeral joint, pain with pressure to the anterior aspect of the humeral head; tightness noted in L bicep                                Objective measurements completed on examination: See above findings.          [facility] Adult PT Treatment/Exercise - 03/08/20 0001       Exercises   Exercises Shoulder      Shoulder Exercises: Supine   Other Supine Exercises dowel AAROM x10 flexion and ER in scaption plane x10      Shoulder Exercises: Seated   Other Seated Exercises dowel abduction AAROM x10                          PT Education - 03/07/20 1731     Education Details HEP, sx, POC, prognosis, frozen shoulder stages    Person(s) Educated Patient    Methods Explanation;Demonstration;Tactile cues;Verbal cues;Handout    Comprehension Verbalized understanding;Need further instruction              PT Short Term Goals - 03/08/20 0845       PT SHORT TERM GOAL #1   Title pt will be I with initial HEP    Time 3    Period Weeks    Status New    Target Date 03/28/20      PT SHORT TERM GOAL #2   Title PT will go over FOTO by 3rd visit.    Time 3    Period Weeks    Status New    Target Date 03/28/20                PT Long Term Goals - 03/08/20 0846       PT LONG TERM GOAL #1   Title FOTO goal once FOTO performed    Time 8    Period Weeks    Status New    Target Date 05/02/20      PT LONG TERM GOAL #2   Title Pt will improve L AROM abduction in supine to 105 to increase functional use of L arm    Baseline L abduction 95 AROM    Time 8    Period Weeks    Status New    Target Date 05/02/20      PT LONG TERM GOAL #3    Title Pt will improve L ER AROM to 35 degrees in order to increase functional use of L UE.    Baseline L ER 25 degrees in scaption plane performed supine    Time 8    Period Weeks    Status New    Target Date 05/02/20      PT LONG TERM GOAL #4   Title functional IR goal once established  Time 8    Period Weeks    Status New    Target Date 05/02/20      PT LONG TERM GOAL #5   Title Pt will demonstrate overal L shoulder strength of 4/5 in order to increase functional use of the L UE    Time 8    Period Weeks    Status New    Target Date 05/02/20                         Plan - 03/07/20 1749     Clinical Impression Statement Pt is a 70 y/o F presenting with acute onset of L shoulder pain as well as chronic thoracic back pain. Pt has severe kyphosis, limited AROM/PROM L shoulder in all directions except IR, decreased L shoulder strength, and hypomobility of the glenohumeral joint. Pts kyphosis is compounded her limited ROM of the L shoulder. This is limiting her from donning/doffing clothes, reaching overhead, carrying items, bathing, and performing basic ADLs/IADLs. All of this is thus limiting her ability to participate in activities around the house, in the community, and transportation. Pt would benefit from skilled PT in order to increase L shoulder and thoracic ROM, functional use and strength of the L UE, postural endurance, and decrease overall pain.    Personal Factors and Comorbidities Age;Comorbidity 2;Fitness    Comorbidities benign essential tremor, osteoporosis    Examination-Activity Limitations Caring for Others;Carry;Hygiene/Grooming;Dressing;Reach Overhead;Bathing;Lift;Sleep    Examination-Participation Restrictions Cleaning;Shop;Driving;Yard Work;Laundry;Community Activity;Meal Prep    Stability/Clinical Decision Making Stable/Uncomplicated    Clinical Decision Making Low    Rehab Potential Fair    PT Frequency 2x / week    PT Duration 8 weeks    PT  Treatment/Interventions ADLs/Self Care Home Management;Moist Heat;Electrical Stimulation;Functional mobility training;Therapeutic activities;Therapeutic exercise;Patient/family education;Passive range of motion;Manual techniques;Iontophoresis 4mg /ml Dexamethasone;Cryotherapy;Balance training    PT Next Visit Plan Assess FOTO, review HEP and progress, eventually discuss decreased 2x/week (don't need for the 2x week for 8 weeks like scheduled), look at functional ER and IR, progress dowel exercises, manual in all directions, postural exercises    PT Home Exercise Plan [email or web address]    Consulted and Agree with Plan of Care Patient             Patient will benefit from skilled therapeutic intervention in order to improve the following deficits and impairments:  Decreased mobility,Decreased strength,Postural dysfunction,Decreased range of motion,Pain,Decreased activity tolerance,Hypomobility  Visit Diagnosis: Chronic left shoulder pain  Stiffness of left shoulder, not elsewhere classified  Abnormal posture  Pain in thoracic spine      Problem List Patient Active Problem List   Diagnosis Date Noted   E. coli UTI 03/20/2018   Hypotension 03/20/2018   Dehydration 03/20/2018   Hypokalemia 03/20/2018   Vitamin B12 deficiency 03/20/2018   Folate deficiency 03/20/2018   Anemia 03/20/2018   Hyperlipidemia 03/19/2018   Acute lower UTI 03/19/2018   Acute metabolic encephalopathy 03/19/2018   Encephalopathy acute 03/19/2018   T8 vertebral fracture, with delayed healing, subsequent encounter 02/15/2015   Osteoporosis    Benign essential tremor     04/15/2015, SPT 03/08/2020, 9:30 AM  [facility] [address] [place], [place], [place] Phone: [phone]   Fax:  [phone]  Name: Hannah Perez MRN: Jacqulyn Cane Date of Birth: Jul 16, 1950

## 2020-03-12 ENCOUNTER — Other Ambulatory Visit: Payer: Self-pay

## 2020-03-12 ENCOUNTER — Ambulatory Visit: Payer: Medicare Other | Attending: Family Medicine | Admitting: Physical Therapy

## 2020-03-12 ENCOUNTER — Encounter: Payer: Self-pay | Admitting: Physical Therapy

## 2020-03-12 DIAGNOSIS — R293 Abnormal posture: Secondary | ICD-10-CM | POA: Diagnosis not present

## 2020-03-12 DIAGNOSIS — M546 Pain in thoracic spine: Secondary | ICD-10-CM | POA: Insufficient documentation

## 2020-03-12 DIAGNOSIS — G8929 Other chronic pain: Secondary | ICD-10-CM

## 2020-03-12 DIAGNOSIS — M25612 Stiffness of left shoulder, not elsewhere classified: Secondary | ICD-10-CM | POA: Diagnosis not present

## 2020-03-12 DIAGNOSIS — M25512 Pain in left shoulder: Secondary | ICD-10-CM | POA: Insufficient documentation

## 2020-03-12 NOTE — Therapy (Signed)
[facility] Outpatient Rehabilitation [facility] [address] [place], [place], [address] Phone: [phone]   Fax:  [phone]  Physical Therapy Treatment  Patient Details  Name: Hannah Perez MRN: [id number] Date of Birth: 1950-06-03 Referring Provider (PT): Gweneth Dimitri, MD   Encounter Date: 03/12/2020   PT End of Session - 03/12/20 1407    Visit Number 2    Number of Visits 16    Date for PT Re-Evaluation 05/02/20    Authorization Type MCR/BCBS    Authorization Time Period FOTO by 6th, KX by 15th    Progress Note Due on Visit 10    PT Start Time 1401    PT Stop Time 1445    PT Time Calculation (min) 44 min    Activity Tolerance Patient tolerated treatment well    Behavior During Therapy [facility] for tasks assessed/performed           Past Medical History:  Diagnosis Date  . Asthma    trigger from dog hair, dust , grass  . Benign essential tremor   . Folate deficiency 03/20/2018  . Hypercholesteremia   . Osteoporosis 2009   on forteo  . Vertigo   . Vitamin B12 deficiency 03/20/2018    Past Surgical History:  Procedure Laterality Date  . CATARACT EXTRACTION Left 04/2012  . KYPHOPLASTY    . KYPHOPLASTY N/A 02/15/2015   Procedure: Thoracic eight Kyphoplasty;  Surgeon: Barnett Abu, MD;  Location: [facility];  Service: Neurosurgery;  Laterality: N/A;  T8 Kyphoplasty  . TONSILLECTOMY    . TUBAL LIGATION      There were no vitals filed for this visit.   Subjective Assessment - 03/12/20 1402    Subjective Patient reports things are going well, except one of the exercises she doesn't feel like she is doing them right.    Patient Stated Goals 'not hurt', move it normally    Currently in Pain? Yes    Pain Score 2     Pain Location Shoulder    Pain Orientation Left    Pain Descriptors / Indicators Tightness    Pain Type Acute pain    Pain Onset More than a month ago    Pain Frequency Intermittent              [facility] PT Assessment - 03/12/20 0001       Observation/Other Assessments   Focus on Therapeutic Outcomes (FOTO)  46% functional status      AROM   Left Shoulder Flexion 135 Degrees                         [facility] Adult PT Treatment/Exercise - 03/12/20 0001      Self-Care   Self-Care Other Self-Care Comments    Other Self-Care Comments  FOTO      Exercises   Exercises Shoulder      Shoulder Exercises: Supine   Flexion 10 reps   2 sets, 3 sec hold   Flexion Limitations dowel AAROM      Shoulder Exercises: Standing   External Rotation 10 reps   3 sec hold   External Rotation Limitations dowel AAROM with towel roll under arm    ABduction 10 reps   2 sets, 3 sec hold   ABduction Limitations dowel AAROM thumb up    Other Standing Exercises Doorway ER stretch x 10      Manual Therapy   Manual Therapy Joint mobilization;Passive ROM  Joint Mobilization Left GHJ mobs primarily inferior and posterior at various ranges of elevation    Passive ROM Left shoulder elevation and ER                  PT Education - 03/12/20 1407    Education Details HEP update    Person(s) Educated Patient    Methods Explanation;Demonstration;Verbal cues;Handout    Comprehension Verbalized understanding;Need further instruction;Returned demonstration;Verbal cues required            PT Short Term Goals - 03/08/20 0845      PT SHORT TERM GOAL #1   Title pt will be I with initial HEP    Time 3    Period Weeks    Status New    Target Date 03/28/20      PT SHORT TERM GOAL #2   Title PT will go over FOTO by 3rd visit.    Time 3    Period Weeks    Status New    Target Date 03/28/20             PT Long Term Goals - 03/08/20 0846      PT LONG TERM GOAL #1   Title FOTO goal once FOTO performed    Time 8    Period Weeks    Status New    Target Date 05/02/20      PT LONG TERM GOAL #2   Title Pt will improve L AROM abduction in supine to 105 to increase functional use of L arm    Baseline L abduction 95  AROM    Time 8    Period Weeks    Status New    Target Date 05/02/20      PT LONG TERM GOAL #3   Title Pt will improve L ER AROM to 35 degrees in order to increase functional use of L UE.    Baseline L ER 25 degrees in scaption plane performed supine    Time 8    Period Weeks    Status New    Target Date 05/02/20      PT LONG TERM GOAL #4   Title functional IR goal once established    Time 8    Period Weeks    Status New    Target Date 05/02/20      PT LONG TERM GOAL #5   Title Pt will demonstrate overal L shoulder strength of 4/5 in order to increase functional use of the L UE    Time 8    Period Weeks    Status New    Target Date 05/02/20                 Plan - 03/12/20 1408    Clinical Impression Statement Patient tolerated therapy well with no adverse effects. She did exhibit improved active shoulder elevation following manual and reported improved pain/tightness. Therapy focused on manual consisting of joint mobs and stretching focused with AAROM primarily for shoulder elevation and ER. HEP modified for shoulder ER stretching do be at doorway. She would benefit continued skilled PT to increase left shoulder and thoracic ROM, functional use and strength of the left UE, postural endurance, and decrease overall pain.    PT Treatment/Interventions ADLs/Self Care Home Management;Moist Heat;Electrical Stimulation;Functional mobility training;Therapeutic activities;Therapeutic exercise;Patient/family education;Passive range of motion;Manual techniques;Iontophoresis 4mg /ml Dexamethasone;Cryotherapy;Balance training    PT Next Visit Plan Review HEP and progress, eventually discuss decreased 2x/week (don't need for the 2x week for  8 weeks like scheduled), look at functional ER and IR, progress dowel exercises, manual in all directions, postural exercises    PT Home Exercise Plan [email or web address]    Consulted and Agree with Plan of Care Patient           Patient will benefit from  skilled therapeutic intervention in order to improve the following deficits and impairments:  Decreased mobility,Decreased strength,Postural dysfunction,Decreased range of motion,Pain,Decreased activity tolerance,Hypomobility  Visit Diagnosis: Chronic left shoulder pain  Stiffness of left shoulder, not elsewhere classified  Abnormal posture  Pain in thoracic spine     Problem List Patient Active Problem List   Diagnosis Date Noted  . E. coli UTI 03/20/2018  . Hypotension 03/20/2018  . Dehydration 03/20/2018  . Hypokalemia 03/20/2018  . Vitamin B12 deficiency 03/20/2018  . Folate deficiency 03/20/2018  . Anemia 03/20/2018  . Hyperlipidemia 03/19/2018  . Acute lower UTI 03/19/2018  . Acute metabolic encephalopathy 03/19/2018  . Encephalopathy acute 03/19/2018  . T8 vertebral fracture, with delayed healing, subsequent encounter 02/15/2015  . Osteoporosis   . Benign essential tremor     Rosana Hoes, PT, DPT, LAT, ATC 03/12/20  3:18 PM Phone: [phone] Fax: [phone]   [facility] Outpatient Rehabilitation [facility] [address] [place], [place], [address] Phone: [phone]   Fax:  [phone]  Name: Hannah Perez MRN: [id number] Date of Birth: 10-May-1950

## 2020-03-12 NOTE — Patient Instructions (Signed)
Access Code: [email or web address] URL: [email or web address] Date: 03/12/2020 Prepared by: Rosana Hoes  Exercises Supine Shoulder Flexion Extension AAROM with Dowel - 2 x daily - 7 x weekly - 1-2 sets - 10 reps Standing Shoulder Abduction AAROM with Dowel - 2 x daily - 7 x weekly - 1-2 sets - 10 reps Standing Shoulder External Rotation Stretch in Doorway - 2 x daily - 7 x weekly - 1-2 sets - 10 reps

## 2020-03-14 ENCOUNTER — Encounter: Payer: Self-pay | Admitting: Physical Therapy

## 2020-03-14 ENCOUNTER — Ambulatory Visit: Payer: Medicare Other | Admitting: Physical Therapy

## 2020-03-14 ENCOUNTER — Other Ambulatory Visit: Payer: Self-pay

## 2020-03-14 DIAGNOSIS — M546 Pain in thoracic spine: Secondary | ICD-10-CM | POA: Diagnosis not present

## 2020-03-14 DIAGNOSIS — M25512 Pain in left shoulder: Secondary | ICD-10-CM

## 2020-03-14 DIAGNOSIS — M25612 Stiffness of left shoulder, not elsewhere classified: Secondary | ICD-10-CM | POA: Diagnosis not present

## 2020-03-14 DIAGNOSIS — G8929 Other chronic pain: Secondary | ICD-10-CM | POA: Diagnosis not present

## 2020-03-14 DIAGNOSIS — R293 Abnormal posture: Secondary | ICD-10-CM

## 2020-03-14 NOTE — Therapy (Addendum)
[facility] [address] [place], [place], [address] Phone: [phone]   Fax:  [phone]  Physical Therapy Treatment  Patient Details  Name: Hannah Perez MRN: [id number] Date of Birth: [birth year] Referring Provider (PT): Gweneth Dimitri, MD   Encounter Date: 03/14/2020   PT End of Session - 03/14/20 1042     Visit Number 3    Number of Visits 16    Date for PT Re-Evaluation 05/02/20    Authorization Type MCR/BCBS    Authorization Time Period FOTO by 6th, KX by 15th    Progress Note Due on Visit 10    PT Start Time 1044    PT Stop Time 1131    PT Time Calculation (min) 47 min    Activity Tolerance Patient tolerated treatment well    Behavior During Therapy [facility] for tasks assessed/performed             Past Medical History:  Diagnosis Date   Asthma    trigger from dog hair, dust , grass   Benign essential tremor    Folate deficiency 03/20/2018   Hypercholesteremia    Osteoporosis 2009   on forteo   Vertigo    Vitamin B12 deficiency 03/20/2018    Past Surgical History:  Procedure Laterality Date   CATARACT EXTRACTION Left 04/2012   KYPHOPLASTY     KYPHOPLASTY N/A 02/15/2015   Procedure: Thoracic eight Kyphoplasty;  Surgeon: Barnett Abu, MD;  Location: [facility];  Service: Neurosurgery;  Laterality: N/A;  T8 Kyphoplasty   TONSILLECTOMY     TUBAL LIGATION      There were no vitals filed for this visit.   Subjective Assessment - 03/14/20 1046     Subjective The doorway exercise she isn't sure if she is doing right, it is hurting her knees. Everything else is about the same.    Patient Stated Goals 'not hurt', move it normally    Currently in Pain? Yes    Pain Score --   'i feel it but it's not pain'   Pain Location Shoulder    Pain Orientation Left    Pain Descriptors / Indicators Tightness    Pain Type Acute pain    Pain Onset More than a month ago    Pain Frequency Intermittent    Pain Onset More than a  month ago    Pain Frequency Intermittent                                               [facility] Adult PT Treatment/Exercise - 03/14/20 0001       Shoulder Exercises: Supine   Flexion 10 reps   2 sets, 3 sec holds   Flexion Limitations dowel AAROM      Shoulder Exercises: Seated   Other Seated Exercises dowel abduction AAROM x10      Shoulder Exercises: Standing   External Rotation 10 reps   3 second holds   External Rotation Limitations dowel AAROM with towel roll under arm    ABduction 12 reps    ABduction Limitations dowel AAROM thumb up    Other Standing Exercises Doorway ER stretch x 10    Other Standing Exercises standing mirror flexion with pillowcase x10   watch for shoulder shrug, told pt to self correct     Manual Therapy   Manual Therapy Joint  mobilization;Passive ROM    Joint Mobilization Left GHJ mobs primarily inferior and posterior at various ranges of elevation    Passive ROM Left shoulder elevation and ER                          PT Education - 03/14/20 1237     Education Details HEP update    Person(s) Educated Patient    Methods Explanation;Demonstration;Tactile cues;Verbal cues;Handout    Comprehension Verbalized understanding;Need further instruction              PT Short Term Goals - 03/08/20 0845       PT SHORT TERM GOAL #1   Title pt will be I with initial HEP    Time 3    Period Weeks    Status New    Target Date 03/28/20      PT SHORT TERM GOAL #2   Title PT will go over FOTO by 3rd visit.    Time 3    Period Weeks    Status New    Target Date 03/28/20                PT Long Term Goals - 03/14/20 1407       PT LONG TERM GOAL #1   Title Pt will increase FOTO from 46% to 65% to show functional improvement    Time 8    Period Weeks    Status New    Target Date 05/02/20      PT LONG TERM GOAL #2   Title Pt will improve L AROM abduction in supine to 105 to increase functional use of L arm     Baseline L abduction 95 AROM    Time 8    Period Weeks    Status New    Target Date 05/02/20      PT LONG TERM GOAL #3   Title Pt will improve L ER AROM to 35 degrees in order to increase functional use of L UE.    Baseline L ER 25 degrees in scaption plane performed supine    Time 8    Period Weeks    Status New    Target Date 05/02/20      PT LONG TERM GOAL #4   Title functional IR goal once established    Time 8    Period Weeks    Status New    Target Date 05/02/20      PT LONG TERM GOAL #5   Title Pt will demonstrate overal L shoulder strength of 4/5 in order to increase functional use of the L UE    Time 8    Period Weeks    Status New    Target Date 05/02/20                        Plan - 03/14/20 1400     Clinical Impression Statement Pt tolerated PT well with no adverse effects. Pt showed decreased carryover of HEP, as she needed to be ran through [facility] abduction and wall ER in order to perform properly. Wall slides on a mirror was performed this session because pt showed shoulder shrug without the mirror and was unable to correct it with tactile cueing, so visual feedback was needed. Shows improved shoulder PROM and AROM each repetition. She would benefit from continued skilled PT in order to increase L shoulder and thoracic ROM, functional use  and strength of the L UE, postural endurance, and decrease pain.    PT Treatment/Interventions ADLs/Self Care Home Management;Moist Heat;Electrical Stimulation;Functional mobility training;Therapeutic activities;Therapeutic exercise;Patient/family education;Passive range of motion;Manual techniques;Iontophoresis 4mg /ml Dexamethasone;Cryotherapy;Balance training    PT Next Visit Plan assess functional IR to establish long term goal, review HEP and progress, continue to incorporate external feedback and slowly move to internal feedback for exercises PRN, progress dowel exercises, manual in all directions, start to incorporate  strengthening    PT Home Exercise Plan [email or web address]    Consulted and Agree with Plan of Care Patient             Patient will benefit from skilled therapeutic intervention in order to improve the following deficits and impairments:  Decreased mobility,Decreased strength,Postural dysfunction,Decreased range of motion,Pain,Decreased activity tolerance,Hypomobility  Visit Diagnosis: Chronic left shoulder pain  Stiffness of left shoulder, not elsewhere classified  Abnormal posture  Pain in thoracic spine      Problem List Patient Active Problem List   Diagnosis Date Noted   E. coli UTI 03/20/2018   Hypotension 03/20/2018   Dehydration 03/20/2018   Hypokalemia 03/20/2018   Vitamin B12 deficiency 03/20/2018   Folate deficiency 03/20/2018   Anemia 03/20/2018   Hyperlipidemia 03/19/2018   Acute lower UTI 03/19/2018   Acute metabolic encephalopathy 03/19/2018   Encephalopathy acute 03/19/2018   T8 vertebral fracture, with delayed healing, subsequent encounter 02/15/2015   Osteoporosis    Benign essential tremor     04/15/2015, SPT 03/14/2020, 2:11 PM  [facility] [address] [place], [place], [place] Phone: [phone]   Fax:  [phone]  Name: Hannah Perez MRN: Jacqulyn Cane Date of Birth: Jul 15, 1950

## 2020-03-14 NOTE — Patient Instructions (Signed)
Access Code: [email or web address] URL: [email or web address] Date: 03/14/2020 Prepared by: Rosana Hoes  Exercises Supine Shoulder Flexion Extension AAROM with Dowel - 2 x daily - 7 x weekly - 1-2 sets - 10 reps Seated Shoulder External Rotation with Dowel - 2 x daily - 7 x weekly - 1-2 sets - 10 reps Standing Shoulder Abduction AAROM with Dowel - 2 x daily - 7 x weekly - 1-2 sets - 10 reps Standing Shoulder External Rotation Stretch in Doorway - 2 x daily - 7 x weekly - 1-2 sets - 10 reps Shoulder Flexion Wall Slide with Towel - 2 x daily - 7 x weekly - 1-2 sets - 10 reps

## 2020-03-27 ENCOUNTER — Ambulatory Visit: Payer: Medicare Other

## 2020-03-27 ENCOUNTER — Other Ambulatory Visit: Payer: Self-pay

## 2020-03-27 DIAGNOSIS — G8929 Other chronic pain: Secondary | ICD-10-CM | POA: Diagnosis not present

## 2020-03-27 DIAGNOSIS — M546 Pain in thoracic spine: Secondary | ICD-10-CM | POA: Diagnosis not present

## 2020-03-27 DIAGNOSIS — R293 Abnormal posture: Secondary | ICD-10-CM | POA: Diagnosis not present

## 2020-03-27 DIAGNOSIS — M25512 Pain in left shoulder: Secondary | ICD-10-CM | POA: Diagnosis not present

## 2020-03-27 DIAGNOSIS — M25612 Stiffness of left shoulder, not elsewhere classified: Secondary | ICD-10-CM

## 2020-03-27 NOTE — Therapy (Signed)
[facility] [address] [place], [place], [address] Phone: [phone]   Fax:  [phone]  Physical Therapy Treatment  Patient Details  Name: Hannah Perez MRN: [id number] Date of Birth: 13-May-1950 Referring Provider (PT): Gweneth Dimitri, MD   Encounter Date: 03/27/2020   PT End of Session - 03/27/20 1441    Visit Number 4    Number of Visits 16    Date for PT Re-Evaluation 05/02/20    Authorization Type MCR/BCBS    Authorization Time Period FOTO by 6th, KX by 15th    Progress Note Due on Visit 10    PT Start Time 1445    PT Stop Time 1530    PT Time Calculation (min) 45 min    Activity Tolerance Patient tolerated treatment well    Behavior During Therapy [facility] for tasks assessed/performed           Past Medical History:  Diagnosis Date   Asthma    trigger from dog hair, dust , grass   Benign essential tremor    Folate deficiency 03/20/2018   Hypercholesteremia    Osteoporosis 2009   on forteo   Vertigo    Vitamin B12 deficiency 03/20/2018    Past Surgical History:  Procedure Laterality Date   CATARACT EXTRACTION Left 04/2012   KYPHOPLASTY     KYPHOPLASTY N/A 02/15/2015   Procedure: Thoracic eight Kyphoplasty;  Surgeon: Barnett Abu, MD;  Location: [facility];  Service: Neurosurgery;  Laterality: N/A;  T8 Kyphoplasty   TONSILLECTOMY     TUBAL LIGATION      There were no vitals filed for this visit.   Subjective Assessment - 03/27/20 1441    Subjective "I didn't do the exercises this morning since I was going to be here. I have done them other days. I do feel like the exercises are helping my shoulder and my back."    Limitations House hold activities;Lifting    How long can you sit comfortably? not bad    How long can you stand comfortably? 5 minutes (taking a shower time)    How long can you walk comfortably? grocery store shopping    Patient Stated Goals 'not hurt', move it normally    Currently  in Pain? 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    Referring Provider (PT) Gweneth Dimitri, MD                         [facility] Adult PT Treatment/Exercise - 03/27/20 0001      Self-Care   Self-Care Other Self-Care Comments    Other Self-Care Comments  See patient education      Shoulder Exercises: Supine   Protraction Strengthening;Both;20 reps  Protraction Limitations with dowel; cues for technique/form    Horizontal ABduction Strengthening;Both;15 reps;Theraband    Theraband Level (Shoulder Horizontal ABduction) Level 1 (Yellow)    External Rotation AAROM;Left;10 reps    External Rotation Limitations AAROM using dowel with towel to keep L elbow tucked by side    Flexion AAROM;Both;20 reps    Flexion Limitations dowel AAROM      Shoulder Exercises: Standing   External Rotation AAROM;Left;10 reps   3 second holds   External Rotation Limitations dowel AAROM with towel roll under arm    Flexion AROM;Both;10 reps    Flexion Limitations at mirror by parallel bars    ABduction AAROM;20 reps;Left    ABduction Limitations dowel AAROM thumb up      Shoulder Exercises: ROM/Strengthening   UBE (Upper Arm Bike) L1.5 x 6 min (4 min forward, 2 min retro)      Manual Therapy   Manual Therapy Passive ROM;Soft tissue mobilization;Myofascial release    Soft tissue mobilization STM/MFR along L posterior deltoid and triceps    Passive ROM Left shoulder elevation and ER                   PT Education - 03/27/20 1557    Education Details Updated HEP and reviewed initial HEP to address patient's questions. Tennis ball for STM and demo/returned demo using theracane    Person(s) Educated Patient    Methods Explanation;Demonstration;Tactile cues;Verbal cues;Handout    Comprehension Verbalized understanding;Need further instruction;Verbal cues required;Tactile cues required            PT Short Term Goals - 03/27/20 1541      PT SHORT TERM GOAL #1   Title pt will be I with initial HEP    Baseline verbalizes being compliant with HEP since last visit aside from today due to having an appointment    Time 3    Period Weeks    Status Achieved    Target Date 03/28/20      PT SHORT TERM GOAL #2   Title PT will go over FOTO by 3rd visit.    Baseline reviewed previously (03/12/2020 per self care)    Time 3    Period Weeks    Status Achieved    Target Date 03/28/20             PT Long Term Goals - 03/27/20 1543      PT LONG TERM GOAL #1   Title Pt will increase FOTO from 46% to 65% to show functional improvement    Time 8    Period Weeks    Status New      PT LONG TERM GOAL #2   Title Pt will improve L AROM abduction in supine to 105 to increase functional use of L arm    Baseline L abduction 95 AROM    Time 8    Period Weeks    Status New      PT LONG TERM GOAL #3   Title Pt will improve L ER AROM to 35 degrees in order to increase functional use of L UE.    Baseline L ER 25 degrees in scaption plane performed supine    Time 8    Period Weeks    Status New      PT LONG TERM GOAL #4   Title Patient will be able to scratch her back (functional IR) as desired and don/doff bra.    Baseline Pt reports being unable to scratch  her back like she could before - will assess level of functional IR next session    Time 8    Period Weeks    Status New      PT LONG TERM GOAL #5   Title Pt will demonstrate overal L shoulder strength of 4/5 in order to  increase functional use of the L UE    Time 8    Period Weeks    Status New                 Plan - 03/27/20 1442    Clinical Impression Statement Patient tolerated PT session well with no adverse effects. She experienced tenderness along L posterior deltoid and triceps upon palpation and STM/MFR. Patient continues to have limitations with carryover of certain exercises from HEP but verbalizes compliance with majority of interventions. She did well with addition of scapular protraction and supine bilateral shoulder abduction today. Pt expresses difficulty reaching behind her back to scratch her back but notes improvement in pulling her L arm back to close a door that's in front of her compared to before beginning PT. She should benefit from continued skilled PT to further improve L shoulder and thoracic mobility, postural awareness/control, and tolerance with functional tasks.    Personal Factors and Comorbidities Age;Comorbidity 2;Fitness    Comorbidities benign essential tremor, osteoporosis    Examination-Activity Limitations Caring for Others;Carry;Hygiene/Grooming;Dressing;Reach Overhead;Bathing;Lift;Sleep    Examination-Participation Restrictions Cleaning;Shop;Driving;Yard Work;Laundry;Community Activity;Meal Prep    PT Treatment/Interventions ADLs/Self Care Home Management;Moist Heat;Electrical Stimulation;Functional mobility training;Therapeutic activities;Therapeutic exercise;Patient/family education;Passive range of motion;Manual techniques;Iontophoresis 4mg /ml Dexamethasone;Cryotherapy;Balance training    PT Next Visit Plan assess functional IR, review HEP and progress, continue to incorporate external feedback and slowly move to internal feedback for exercises PRN, progress dowel exercises, manual in all directions, STM/MFR to left posterior shoulder musculature, start to incorporate strengthening    PT Home Exercise Plan [email or web address]    Consulted and Agree with Plan of Care Patient            Patient will benefit from skilled therapeutic intervention in order to improve the following deficits and impairments:  Decreased mobility,Decreased strength,Postural dysfunction,Decreased range of motion,Pain,Decreased activity tolerance,Hypomobility  Visit Diagnosis: Chronic left shoulder pain  Stiffness of left shoulder, not elsewhere classified  Abnormal posture  Pain in thoracic spine     Problem List Patient Active Problem List   Diagnosis Date Noted   E. coli UTI 03/20/2018   Hypotension 03/20/2018   Dehydration 03/20/2018   Hypokalemia 03/20/2018   Vitamin B12 deficiency 03/20/2018   Folate deficiency 03/20/2018   Anemia 03/20/2018   Hyperlipidemia 03/19/2018   Acute lower UTI 03/19/2018   Acute metabolic encephalopathy 03/19/2018   Encephalopathy acute 03/19/2018   T8 vertebral fracture, with delayed healing, subsequent encounter 02/15/2015   Osteoporosis    Benign essential tremor     04/15/2015, PT, DPT 03/27/20 4:05 PM  [facility] [address] [place], [place], [place] Phone: [phone]   Fax:  [phone]  Name: Hannah Perez MRN: Jacqulyn Cane Date of Birth: [birth year]

## 2020-04-01 ENCOUNTER — Encounter: Payer: Self-pay | Admitting: Physical Therapy

## 2020-04-01 ENCOUNTER — Ambulatory Visit: Payer: Medicare Other | Admitting: Physical Therapy

## 2020-04-01 ENCOUNTER — Other Ambulatory Visit: Payer: Self-pay

## 2020-04-01 DIAGNOSIS — M25512 Pain in left shoulder: Secondary | ICD-10-CM

## 2020-04-01 DIAGNOSIS — G8929 Other chronic pain: Secondary | ICD-10-CM | POA: Diagnosis not present

## 2020-04-01 DIAGNOSIS — R293 Abnormal posture: Secondary | ICD-10-CM

## 2020-04-01 DIAGNOSIS — M25612 Stiffness of left shoulder, not elsewhere classified: Secondary | ICD-10-CM | POA: Diagnosis not present

## 2020-04-01 DIAGNOSIS — M546 Pain in thoracic spine: Secondary | ICD-10-CM

## 2020-04-01 NOTE — Therapy (Cosign Needed)
[facility] [address] [place], [place], [address] Phone: [phone]   Fax:  [phone]  Physical Therapy Treatment  Patient Details  Name: Hannah Perez MRN: [id number] Date of Birth: 07/18/1950 Referring Provider (PT): Gweneth Dimitri, MD   Encounter Date: 04/01/2020   PT End of Session - 04/01/20 1703     Visit Number 5    Number of Visits 16    Date for PT Re-Evaluation 05/02/20    Authorization Type MCR/BCBS    Authorization Time Period FOTO by 6th, KX by 15th    Progress Note Due on Visit 10    PT Start Time 1703    PT Stop Time 1750    PT Time Calculation (min) 47 min    Activity Tolerance Patient tolerated treatment well    Behavior During Therapy [facility] for tasks assessed/performed             Past Medical History:  Diagnosis Date   Asthma    trigger from dog hair, dust , grass   Benign essential tremor    Folate deficiency 03/20/2018   Hypercholesteremia    Osteoporosis 2009   on forteo   Vertigo    Vitamin B12 deficiency 03/20/2018    Past Surgical History:  Procedure Laterality Date   CATARACT EXTRACTION Left 04/2012   KYPHOPLASTY     KYPHOPLASTY N/A 02/15/2015   Procedure: Thoracic eight Kyphoplasty;  Surgeon: Barnett Abu, MD;  Location: [facility];  Service: Neurosurgery;  Laterality: N/A;  T8 Kyphoplasty   TONSILLECTOMY     TUBAL LIGATION      There were no vitals filed for this visit.   Subjective Assessment - 04/01/20 1705     Subjective Last time I was shown the thing with the tennis balls and that has really been helping. The one where I was lifting my arm up caused pain for 2 days after doing it (referring to supine protraction, which was not given in her HEP), so I have stopped doing that one. I'm also not sure if I'm not doing some of the exercises correctly (later referred to horizontal abduction in supine and AAROM dowel abduction) because it is hurting my wrist. I've noticed that I can  get my arm stretched out (into flexion) on the windowsill in my car now, which I wasn't able to do before). My (point to middle delotid) hurts today, I think I might have been overdoing something, I'm not sure what.    Currently in Pain? Yes    Pain Score 2     Pain Location Shoulder    Pain Orientation Left    Pain Descriptors / Indicators Tightness    Pain Type Acute pain    Pain Onset More than a month ago    Pain Frequency Intermittent    Pain Score 3    Pain Location Thoracic    Pain Orientation Mid    Pain Descriptors / Indicators --   'hurts'   Pain Onset More than a month ago    Pain Frequency Intermittent                                               [facility] Adult PT Treatment/Exercise - 04/01/20 0001       Self-Care   Self-Care Other Self-Care Comments    Other Self-Care Comments  education on giving her shoulder a break with exercises for a day if she still is experiencing middle deltoid pain      Shoulder Exercises: Supine   Horizontal ABduction Strengthening;Both;Theraband;5 reps    Theraband Level (Shoulder Horizontal ABduction) Level 1 (Yellow)    Horizontal ABduction Limitations painful today, d/c for today    External Rotation AAROM;Left;10 reps    External Rotation Limitations AAROM using dowel with towel to keep L elbow tucked by side    Flexion AAROM;Both;10 reps    Flexion Limitations dowel AAROM      Shoulder Exercises: Standing   ABduction 15 reps;AAROM    ABduction Limitations dowel AAROM thumb up    Other Standing Exercises Doorway ER stretch x 10   cueing for proper form   Other Standing Exercises standing mirror flexion with pillowcase x10   elbow in flexion and shoulder going into scaption     Shoulder Exercises: ROM/Strengthening   UBE (Upper Arm Bike) L1 4 min ( fwd/17min bck)                          PT Education - 04/01/20 1810     Education Details told to get rid of her previous HEP handouts to avoid  confusion and given a new HEP handout of the exercises she is supposed to do, education to only perform the exercises given in her HEP handout and nothing extra to avoid increase in pain    Person(s) Educated Patient    Methods Explanation;Demonstration;Tactile cues;Verbal cues;Handout    Comprehension Need further instruction;Tactile cues required;Returned demonstration;Verbalized understanding;Verbal cues required              PT Short Term Goals - 03/27/20 1541       PT SHORT TERM GOAL #1   Title pt will be I with initial HEP    Baseline verbalizes being compliant with HEP since last visit aside from today due to having an appointment    Time 3    Period Weeks    Status Achieved    Target Date 03/28/20      PT SHORT TERM GOAL #2   Title PT will go over FOTO by 3rd visit.    Baseline reviewed previously (03/12/2020 per self care)    Time 3    Period Weeks    Status Achieved    Target Date 03/28/20                PT Long Term Goals - 03/27/20 1543       PT LONG TERM GOAL #1   Title Pt will increase FOTO from 46% to 65% to show functional improvement    Time 8    Period Weeks    Status New      PT LONG TERM GOAL #2   Title Pt will improve L AROM abduction in supine to 105 to increase functional use of L arm    Baseline L abduction 95 AROM    Time 8    Period Weeks    Status New      PT LONG TERM GOAL #3   Title Pt will improve L ER AROM to 35 degrees in order to increase functional use of L UE.    Baseline L ER 25 degrees in scaption plane performed supine    Time 8    Period Weeks    Status New      PT LONG TERM GOAL #4  Title Patient will be able to scratch her back (functional IR) as desired and don/doff bra.    Baseline Pt reports being unable to scratch her back like she could before - will assess level of functional IR next session    Time 8    Period Weeks    Status New      PT LONG TERM GOAL #5   Title Pt will demonstrate overal L shoulder  strength of 4/5 in order to increase functional use of the L UE    Time 8    Period Weeks    Status New                        Plan - 04/01/20 1808     Clinical Impression Statement Pt tolerated PT session well with no adverse effects. Pt showed decreased retention with all exercises as she needed continual multimodal cueing with exercises. She however shows increase in PROM in all planes since last session. Pt experiencing increase in pain today in middle deltoid, as she thinks she may have been overdoing it. She was cued to keep her L wrist neutral with all exercises, as she states that she was having increased pain with some exercises that when demonstrated, her wrist was going into extension. Education given on to only do what she is given as her HEP and nothing more. She would continue to benefit from skilled PT to further improve L shoulder and thoracic mobility, postural awareness/control, and tolerance with functional overhead tasks.    PT Treatment/Interventions ADLs/Self Care Home Management;Moist Heat;Electrical Stimulation;Functional mobility training;Therapeutic activities;Therapeutic exercise;Patient/family education;Passive range of motion;Manual techniques;Iontophoresis 4mg /ml Dexamethasone;Cryotherapy;Balance training    PT Next Visit Plan perform FOTO, review HEP and progress, slowly progress from external to internal feedback with exercises PRN, progress dowl exercises, manual in all directions, continue to incorporate strengthening PRN, pulleys    PT Home Exercise Plan [email or web address]    Consulted and Agree with Plan of Care Patient             Patient will benefit from skilled therapeutic intervention in order to improve the following deficits and impairments:  Decreased mobility,Decreased strength,Postural dysfunction,Decreased range of motion,Pain,Decreased activity tolerance,Hypomobility  Visit Diagnosis: Chronic left shoulder pain  Stiffness of left shoulder, not  elsewhere classified  Abnormal posture  Pain in thoracic spine      Problem List Patient Active Problem List   Diagnosis Date Noted   E. coli UTI 03/20/2018   Hypotension 03/20/2018   Dehydration 03/20/2018   Hypokalemia 03/20/2018   Vitamin B12 deficiency 03/20/2018   Folate deficiency 03/20/2018   Anemia 03/20/2018   Hyperlipidemia 03/19/2018   Acute lower UTI 03/19/2018   Acute metabolic encephalopathy 03/19/2018   Encephalopathy acute 03/19/2018   T8 vertebral fracture, with delayed healing, subsequent encounter 02/15/2015   Osteoporosis    Benign essential tremor     04/15/2015, SPT 04/01/2020, 6:19 PM  [facility] [address] [place], [place], [place] Phone: [phone]   Fax:  [phone]  Name: Hannah Perez MRN: Jacqulyn Cane Date of Birth: 23-May-1950

## 2020-04-01 NOTE — Patient Instructions (Signed)
Access Code: [email or web address] URL: [email or web address] Date: 04/01/2020 Prepared by: Jeri Cos  Exercises Supine Shoulder Flexion Extension AAROM with Dowel - 2 x daily - 7 x weekly - 1-2 sets - 10 reps Seated Shoulder External Rotation with Dowel - 2 x daily - 7 x weekly - 1-2 sets - 10 reps Standing Shoulder Abduction AAROM with Dowel - 2 x daily - 7 x weekly - 1-2 sets - 10 reps Standing Shoulder External Rotation Stretch in Doorway - 2 x daily - 7 x weekly - 1-2 sets - 10 reps Shoulder Flexion Wall Slide with Towel - 2 x daily - 7 x weekly - 1-2 sets - 10 reps Supine Shoulder Horizontal Abduction with Resistance - 1-2 x daily - 7 x weekly - 1-2 sets - 10 reps

## 2020-04-03 ENCOUNTER — Other Ambulatory Visit: Payer: Self-pay

## 2020-04-03 ENCOUNTER — Encounter: Payer: Self-pay | Admitting: Physical Therapy

## 2020-04-03 ENCOUNTER — Ambulatory Visit: Payer: Medicare Other | Admitting: Physical Therapy

## 2020-04-03 DIAGNOSIS — M546 Pain in thoracic spine: Secondary | ICD-10-CM | POA: Diagnosis not present

## 2020-04-03 DIAGNOSIS — M25512 Pain in left shoulder: Secondary | ICD-10-CM | POA: Diagnosis not present

## 2020-04-03 DIAGNOSIS — G8929 Other chronic pain: Secondary | ICD-10-CM | POA: Diagnosis not present

## 2020-04-03 DIAGNOSIS — M25612 Stiffness of left shoulder, not elsewhere classified: Secondary | ICD-10-CM | POA: Diagnosis not present

## 2020-04-03 DIAGNOSIS — R293 Abnormal posture: Secondary | ICD-10-CM

## 2020-04-03 NOTE — Therapy (Addendum)
[facility] [address] [place], [place], [address] Phone: [phone]   Fax:  [phone]  Physical Therapy Treatment  Patient Details  Name: MONTEZ STRYKER MRN: [id number] Date of Birth: 1950-09-25 Referring Provider (PT): Gweneth Dimitri, MD   Encounter Date: 04/03/2020   PT End of Session - 04/03/20 1403     Visit Number 6    Number of Visits 16    Date for PT Re-Evaluation 05/02/20    Authorization Type MCR/BCBS    Authorization Time Period FOTO by 6th, KX by 15th    Progress Note Due on Visit 10    PT Start Time 1403    PT Stop Time 1448    PT Time Calculation (min) 45 min    Activity Tolerance Patient tolerated treatment well    Behavior During Therapy [facility] for tasks assessed/performed             Past Medical History:  Diagnosis Date   Asthma    trigger from dog hair, dust , grass   Benign essential tremor    Folate deficiency 03/20/2018   Hypercholesteremia    Osteoporosis 2009   on forteo   Vertigo    Vitamin B12 deficiency 03/20/2018    Past Surgical History:  Procedure Laterality Date   CATARACT EXTRACTION Left 04/2012   KYPHOPLASTY     KYPHOPLASTY N/A 02/15/2015   Procedure: Thoracic eight Kyphoplasty;  Surgeon: Barnett Abu, MD;  Location: [facility];  Service: Neurosurgery;  Laterality: N/A;  T8 Kyphoplasty   TONSILLECTOMY     TUBAL LIGATION      There were no vitals filed for this visit.   Subjective Assessment - 04/03/20 1404     Subjective Last night I did too much housework so my shoulder is sore. I only did a couple of exercises last night because my back was hurting pretty bad.    Currently in Pain? Yes    Pain Score 0-No pain    Pain Location Shoulder    Pain Score 4    Pain Location Thoracic                  [facility] PT Assessment - 04/03/20 0001       Observation/Other Assessments   Focus on Therapeutic Outcomes (FOTO)  FOTO: 44% functional status (decreased from 46% on  intake)                                        [facility] Adult PT Treatment/Exercise - 04/03/20 0001       Self-Care   Other Self-Care Comments  FOTO and then FOTO review      Shoulder Exercises: Supine   Horizontal ABduction Strengthening;Both;Theraband;10 reps   2 sets   Theraband Level (Shoulder Horizontal ABduction) Level 1 (Yellow)    External Rotation Strengthening    External Rotation Weight (lbs) 1# weight    External Rotation Limitations AROM    Flexion AROM;Both;10 reps    Shoulder Flexion Weight (lbs) 1      Shoulder Exercises: Standing   External Rotation Strengthening;Right;10 reps    Theraband Level (Shoulder External Rotation) Level 1 (Yellow)    Internal Rotation Strengthening;10 reps;Right    Other Standing Exercises Doorway ER stretch x 10      Shoulder Exercises: Pulleys   Flexion 1 minute      Manual Therapy  Manual Therapy Joint mobilization;Passive ROM    Joint Mobilization Left GHJ mobs primarily inferior and posterior at various ranges of elevation    Passive ROM Left shoulder elevation and ER                          PT Education - 04/03/20 1454     Education Details HEP update    Person(s) Educated Patient    Methods Explanation;Demonstration;Tactile cues;Verbal cues;Handout    Comprehension Verbalized understanding;Returned demonstration;Need further instruction;Tactile cues required;Verbal cues required              PT Short Term Goals - 03/27/20 1541       PT SHORT TERM GOAL #1   Title pt will be I with initial HEP    Baseline verbalizes being compliant with HEP since last visit aside from today due to having an appointment    Time 3    Period Weeks    Status Achieved    Target Date 03/28/20      PT SHORT TERM GOAL #2   Title PT will go over FOTO by 3rd visit.    Baseline reviewed previously (03/12/2020 per self care)    Time 3    Period Weeks    Status Achieved    Target Date 03/28/20                 PT Long Term Goals - 03/27/20 1543       PT LONG TERM GOAL #1   Title Pt will increase FOTO from 46% to 65% to show functional improvement    Time 8    Period Weeks    Status New      PT LONG TERM GOAL #2   Title Pt will improve L AROM abduction in supine to 105 to increase functional use of L arm    Baseline L abduction 95 AROM    Time 8    Period Weeks    Status New      PT LONG TERM GOAL #3   Title Pt will improve L ER AROM to 35 degrees in order to increase functional use of L UE.    Baseline L ER 25 degrees in scaption plane performed supine    Time 8    Period Weeks    Status New      PT LONG TERM GOAL #4   Title Patient will be able to scratch her back (functional IR) as desired and don/doff bra.    Baseline Pt reports being unable to scratch her back like she could before - will assess level of functional IR next session    Time 8    Period Weeks    Status New      PT LONG TERM GOAL #5   Title Pt will demonstrate overal L shoulder strength of 4/5 in order to increase functional use of the L UE    Time 8    Period Weeks    Status New                        Plan - 04/03/20 1455     Clinical Impression Statement Pt tolerated PT session well with no adverse effects. PROM continues to show progression since last session. FOTO decreased from a 46% to a 44% this session, but pt reports subjective improvement. Pulleys were added this session with good tolerance to increase PROM. Supine strengthening  with theraband and weight further progressed this session within pain free range. Continues to benefit from skilled PT in order to improve further L shoulder and thoracic mobility, postural awareness/control, and tolerance with functional overhead tasks.    PT Treatment/Interventions ADLs/Self Care Home Management;Moist Heat;Electrical Stimulation;Functional mobility training;Therapeutic activities;Therapeutic exercise;Patient/family education;Passive range of  motion;Manual techniques;Iontophoresis 4mg /ml Dexamethasone;Cryotherapy;Balance training    PT Next Visit Plan review HEP and progress, slowly progress from external to internal feedback with exercises PRN, progress dowl exercises, manual in all directions, continue to incorporate strengthening PRN, pulleys    PT Home Exercise Plan [email or web address]    Consulted and Agree with Plan of Care Patient             Patient will benefit from skilled therapeutic intervention in order to improve the following deficits and impairments:  Decreased mobility,Decreased strength,Postural dysfunction,Decreased range of motion,Pain,Decreased activity tolerance,Hypomobility  Visit Diagnosis: Chronic left shoulder pain  Stiffness of left shoulder, not elsewhere classified  Abnormal posture  Pain in thoracic spine      Problem List Patient Active Problem List   Diagnosis Date Noted   E. coli UTI 03/20/2018   Hypotension 03/20/2018   Dehydration 03/20/2018   Hypokalemia 03/20/2018   Vitamin B12 deficiency 03/20/2018   Folate deficiency 03/20/2018   Anemia 03/20/2018   Hyperlipidemia 03/19/2018   Acute lower UTI 03/19/2018   Acute metabolic encephalopathy 03/19/2018   Encephalopathy acute 03/19/2018   T8 vertebral fracture, with delayed healing, subsequent encounter 02/15/2015   Osteoporosis    Benign essential tremor     04/15/2015, SPT 04/03/2020, 3:38 PM  [facility] [address] [place], [place], [place] Phone: [phone]   Fax:  [phone]  Name: SHAHIDA SCHNACKENBERG MRN: Jacqulyn Cane Date of Birth: [birth year]

## 2020-04-03 NOTE — Patient Instructions (Signed)
Access Code: [email or web address] URL: [email or web address] Date: 04/03/2020 Prepared by: Rosana Hoes  Exercises Supine Shoulder Flexion Extension AAROM with Dowel - 2 x daily - 7 x weekly - 1-2 sets - 10 reps Seated Shoulder External Rotation with Dowel - 2 x daily - 7 x weekly - 1-2 sets - 10 reps Standing Shoulder Abduction AAROM with Dowel - 2 x daily - 7 x weekly - 1-2 sets - 10 reps Standing Shoulder External Rotation Stretch in Doorway - 2 x daily - 7 x weekly - 1-2 sets - 10 reps Shoulder Flexion Wall Slide with Towel - 2 x daily - 7 x weekly - 1-2 sets - 10 reps Supine Shoulder Horizontal Abduction with Resistance - 1-2 x daily - 7 x weekly - 1-2 sets - 10 reps Supine Shoulder Flexion with Free Weight - 1 x daily - 7 x weekly - 2 sets - 10 reps

## 2020-04-08 ENCOUNTER — Other Ambulatory Visit: Payer: Self-pay

## 2020-04-08 ENCOUNTER — Ambulatory Visit: Payer: Medicare Other | Admitting: Physical Therapy

## 2020-04-08 ENCOUNTER — Encounter: Payer: Self-pay | Admitting: Physical Therapy

## 2020-04-08 DIAGNOSIS — M25612 Stiffness of left shoulder, not elsewhere classified: Secondary | ICD-10-CM

## 2020-04-08 DIAGNOSIS — M546 Pain in thoracic spine: Secondary | ICD-10-CM | POA: Diagnosis not present

## 2020-04-08 DIAGNOSIS — G8929 Other chronic pain: Secondary | ICD-10-CM

## 2020-04-08 DIAGNOSIS — R293 Abnormal posture: Secondary | ICD-10-CM

## 2020-04-08 DIAGNOSIS — M25512 Pain in left shoulder: Secondary | ICD-10-CM | POA: Diagnosis not present

## 2020-04-08 NOTE — Patient Instructions (Signed)
Access Code: [email or web address] URL: [email or web address] Date: 04/08/2020 Prepared by: Jeri Cos  Exercises Supine Shoulder Flexion Extension AAROM with Dowel - 2 x daily - 7 x weekly - 1-2 sets - 10 reps Seated Shoulder External Rotation with Dowel - 2 x daily - 7 x weekly - 1-2 sets - 10 reps Standing Shoulder Abduction AAROM with Dowel - 2 x daily - 7 x weekly - 1-2 sets - 10 reps Standing Shoulder External Rotation Stretch in Doorway - 2 x daily - 7 x weekly - 1-2 sets - 10 reps Shoulder Flexion Wall Slide with Towel - 2 x daily - 7 x weekly - 1-2 sets - 10 reps Supine Shoulder Horizontal Abduction with Resistance - 1-2 x daily - 7 x weekly - 1-2 sets - 10 reps Supine Shoulder Flexion with Free Weight - 1 x daily - 7 x weekly - 2 sets - 10 reps Shoulder External Rotation with Anchored Resistance - 1 x daily - 7 x weekly - 1 sets - 10 reps

## 2020-04-08 NOTE — Therapy (Addendum)
[facility] [address] [place], [place], [address] Phone: [phone]   Fax:  [phone]  Physical Therapy Treatment  Patient Details  Name: Hannah Perez MRN: [id number] Date of Birth: Jul 09, 1950 Referring Provider (PT): Gweneth Dimitri, MD   Encounter Date: 04/08/2020   PT End of Session - 04/08/20 1737     Visit Number 7    Number of Visits 16    Date for PT Re-Evaluation 05/02/20    Authorization Type MCR/BCBS    Authorization Time Period FOTO by 6th, KX by 15th    Progress Note Due on Visit 10    PT Start Time 1531    PT Stop Time 1615    PT Time Calculation (min) 44 min    Activity Tolerance Patient tolerated treatment well    Behavior During Therapy [facility] for tasks assessed/performed             Past Medical History:  Diagnosis Date   Asthma    trigger from dog hair, dust , grass   Benign essential tremor    Folate deficiency 03/20/2018   Hypercholesteremia    Osteoporosis 2009   on forteo   Vertigo    Vitamin B12 deficiency 03/20/2018    Past Surgical History:  Procedure Laterality Date   CATARACT EXTRACTION Left 04/2012   KYPHOPLASTY     KYPHOPLASTY N/A 02/15/2015   Procedure: Thoracic eight Kyphoplasty;  Surgeon: Barnett Abu, MD;  Location: [facility];  Service: Neurosurgery;  Laterality: N/A;  T8 Kyphoplasty   TONSILLECTOMY     TUBAL LIGATION      There were no vitals filed for this visit.   Subjective Assessment - 04/08/20 1534     Subjective Patient reports continued difficulty with HEP    Currently in Pain? Yes    Pain Score 1     Pain Location Shoulder    Pain Orientation Left    Pain Score 2    Pain Location Thoracic    Pain Orientation Mid                  [facility] PT Assessment - 04/08/20 0001       AROM   Overall AROM Comments functional IR L arm to L2                                        [facility] Adult PT Treatment/Exercise - 04/08/20 0001        Shoulder Exercises: Supine   Flexion AROM;10 reps   2 sets   Shoulder Flexion Weight (lbs) 1      Shoulder Exercises: Standing   External Rotation Strengthening;Right;10 reps   2 sets   Theraband Level (Shoulder External Rotation) Level 1 (Yellow)    External Rotation Limitations 1 set yellow, 1 set with 1# weight    Internal Rotation Strengthening;10 reps;Right   2 sets   Theraband Level (Shoulder Internal Rotation) Level 1 (Yellow)    Internal Rotation Limitations 1 set yellow, 1 set with 1# weight    Extension 10 reps   2 sets   Theraband Level (Shoulder Extension) Level 1 (Yellow)    Other Standing Exercises dowel exercises extension, cross body adduction, and reaching up the back with dowel x10 each      Shoulder Exercises: Pulleys   Flexion 1 minute    Scaption 1 minute  Shoulder Exercises: ROM/Strengthening   UBE (Upper Arm Bike) L2 5 min (2.5 fwd/2 min bck)      Manual Therapy   Manual Therapy Joint mobilization;Passive ROM    Joint Mobilization Left GHJ mobs primarily inferior and posterior at various ranges of elevation    Passive ROM Left shoulder elevation, abduction, and ER                          PT Education - 04/08/20 1719     Education Details HEP update    Person(s) Educated Patient    Methods Explanation;Demonstration;Tactile cues;Verbal cues;Handout    Comprehension Verbalized understanding;Returned demonstration;Need further instruction;Verbal cues required;Tactile cues required              PT Short Term Goals - 03/27/20 1541       PT SHORT TERM GOAL #1   Title pt will be I with initial HEP    Baseline verbalizes being compliant with HEP since last visit aside from today due to having an appointment    Time 3    Period Weeks    Status Achieved    Target Date 03/28/20      PT SHORT TERM GOAL #2   Title PT will go over FOTO by 3rd visit.    Baseline reviewed previously (03/12/2020 per self care)    Time 3    Period Weeks     Status Achieved    Target Date 03/28/20                PT Long Term Goals - 04/08/20 1727       PT LONG TERM GOAL #1   Title Pt will increase FOTO from 46% to 65% to show functional improvement    Time 8    Period Weeks    Status New    Target Date 05/02/20      PT LONG TERM GOAL #2   Title Pt will improve L AROM abduction in supine to 105 to increase functional use of L arm    Baseline L abduction 95 AROM    Time 8    Period Weeks    Status New    Target Date 05/02/20      PT LONG TERM GOAL #3   Title Pt will improve L ER AROM to 35 degrees in order to increase functional use of L UE.    Baseline L ER 25 degrees in scaption plane performed supine    Time 8    Period Weeks    Status New    Target Date 05/02/20      PT LONG TERM GOAL #4   Title Patient will be able to scratch her back (functional IR) as desired and don/doff bra.    Baseline pt at L2 for functional IR    Time 8    Period Weeks    Status New    Target Date 05/02/20      PT LONG TERM GOAL #5   Title Pt will demonstrate overal L shoulder strength of 4/5 in order to increase functional use of the L UE    Time 8    Period Weeks    Status New    Target Date 05/02/20                        Plan - 04/08/20 1537     Clinical Impression Statement Pt tolerated PT session  well with no adverse effects. Graded mobs introduced this session and tolerated during tx, but pt stated increased soreness after mobs, so rest of tx limited some with pain. Functional IR noted to be at L2, so dowel exercises working on increased IR introduced. Periscapular and rotator cuff strengthening further progressed this session to tolerance. Discussion of going down to 1x/week was done but pt doesn't feel like she fully understands exercises as she keeps performing HEP incorrectly and doesn't want to continue to perform HEP incorrectly for a week. Continues to benefit from skilled PT to help achieve independence with  exercises, increase overhead ROM and strength, and return to PLOF while decreasing pain.    PT Treatment/Interventions ADLs/Self Care Home Management;Moist Heat;Electrical Stimulation;Functional mobility training;Therapeutic activities;Therapeutic exercise;Patient/family education;Passive range of motion;Manual techniques;Iontophoresis 4mg /ml Dexamethasone;Cryotherapy;Balance training    PT Next Visit Plan discuss filming her performing exercises to help with retention, dynamic isometric ER (stepping out as maintaining neutral ER/IR), review HEP and progress, slowly progress from external to internal feedback with exercises PRN, progress dowl exercises, manual in all directions, continue to incorporate strengthening PRN, pulleys    PT Home Exercise Plan [email or web address]    Consulted and Agree with Plan of Care Patient             Patient will benefit from skilled therapeutic intervention in order to improve the following deficits and impairments:  Decreased mobility,Decreased strength,Postural dysfunction,Decreased range of motion,Pain,Decreased activity tolerance,Hypomobility  Visit Diagnosis: Chronic left shoulder pain  Stiffness of left shoulder, not elsewhere classified  Abnormal posture  Pain in thoracic spine      Problem List Patient Active Problem List   Diagnosis Date Noted   E. coli UTI 03/20/2018   Hypotension 03/20/2018   Dehydration 03/20/2018   Hypokalemia 03/20/2018   Vitamin B12 deficiency 03/20/2018   Folate deficiency 03/20/2018   Anemia 03/20/2018   Hyperlipidemia 03/19/2018   Acute lower UTI 03/19/2018   Acute metabolic encephalopathy 03/19/2018   Encephalopathy acute 03/19/2018   T8 vertebral fracture, with delayed healing, subsequent encounter 02/15/2015   Osteoporosis    Benign essential tremor     04/15/2015, SPT 04/08/2020, 5:39 PM  [facility] [address] [place], [place], [place] Phone:  [phone]   Fax:  [phone]  Name: Hannah Perez MRN: Jacqulyn Cane Date of Birth: 1950-04-12

## 2020-04-10 ENCOUNTER — Other Ambulatory Visit: Payer: Self-pay

## 2020-04-10 ENCOUNTER — Ambulatory Visit: Payer: Medicare Other | Admitting: Physical Therapy

## 2020-04-10 ENCOUNTER — Encounter: Payer: Self-pay | Admitting: Physical Therapy

## 2020-04-10 DIAGNOSIS — R293 Abnormal posture: Secondary | ICD-10-CM

## 2020-04-10 DIAGNOSIS — M25612 Stiffness of left shoulder, not elsewhere classified: Secondary | ICD-10-CM

## 2020-04-10 DIAGNOSIS — M546 Pain in thoracic spine: Secondary | ICD-10-CM | POA: Diagnosis not present

## 2020-04-10 DIAGNOSIS — G8929 Other chronic pain: Secondary | ICD-10-CM | POA: Diagnosis not present

## 2020-04-10 DIAGNOSIS — M25512 Pain in left shoulder: Secondary | ICD-10-CM | POA: Diagnosis not present

## 2020-04-10 NOTE — Patient Instructions (Signed)
Access Code: [email or web address] URL: [email or web address] Date: 04/10/2020 Prepared by: Rosana Hoes  Exercises Supine Shoulder Flexion Extension AAROM with Dowel - 2 x daily - 7 x weekly - 1-2 sets - 10 reps Seated Shoulder External Rotation with Dowel - 2 x daily - 7 x weekly - 1-2 sets - 10 reps Standing Shoulder Abduction AAROM with Dowel - 2 x daily - 7 x weekly - 1-2 sets - 10 reps Standing Shoulder External Rotation Stretch in Doorway - 2 x daily - 7 x weekly - 1-2 sets - 10 reps Shoulder Flexion Wall Slide with Towel - 2 x daily - 7 x weekly - 1-2 sets - 10 reps Supine Shoulder Horizontal Abduction with Resistance - 1-2 x daily - 7 x weekly - 1-2 sets - 10 reps Supine Shoulder Flexion with Free Weight - 1 x daily - 7 x weekly - 2 sets - 10 reps Shoulder External Rotation with Anchored Resistance - 1 x daily - 7 x weekly - 1 sets - 10 reps Standing Row with Anchored Resistance - 1 x daily - 7 x weekly - 2 sets - 10 reps

## 2020-04-10 NOTE — Therapy (Addendum)
[facility] [address] [place], [place], [address] Phone: [phone]   Fax:  [phone]  Physical Therapy Treatment  Patient Details  Name: Hannah Perez MRN: [id number] Date of Birth: 1950/09/04 Referring Provider (PT): Gweneth Dimitri, MD   Encounter Date: 04/10/2020   PT End of Session - 04/10/20 1517     Visit Number 8    Number of Visits 16    Date for PT Re-Evaluation 05/02/20    Authorization Type MCR/BCBS    Authorization Time Period FOTO by 6th, KX by 15th    Progress Note Due on Visit 10    PT Start Time 1448    PT Stop Time 1538    PT Time Calculation (min) 50 min    Activity Tolerance Patient tolerated treatment well    Behavior During Therapy [facility] for tasks assessed/performed             Past Medical History:  Diagnosis Date   Asthma    trigger from dog hair, dust , grass   Benign essential tremor    Folate deficiency 03/20/2018   Hypercholesteremia    Osteoporosis 2009   on forteo   Vertigo    Vitamin B12 deficiency 03/20/2018    Past Surgical History:  Procedure Laterality Date   CATARACT EXTRACTION Left 04/2012   KYPHOPLASTY     KYPHOPLASTY N/A 02/15/2015   Procedure: Thoracic eight Kyphoplasty;  Surgeon: Barnett Abu, MD;  Location: [facility];  Service: Neurosurgery;  Laterality: N/A;  T8 Kyphoplasty   TONSILLECTOMY     TUBAL LIGATION      There were no vitals filed for this visit.   Subjective Assessment - 04/10/20 1452     Subjective I wasn't sure if I was doing the door one right (shoulder ER), so I stopped doing it because it was hurting.    Currently in Pain? Yes    Pain Score 2     Pain Location Shoulder    Pain Orientation Left    Pain Descriptors / Indicators --   pain below shoulder blade   Pain Onset More than a month ago    Pain Frequency Intermittent    Pain Score 2    Pain Location Thoracic    Pain Orientation Mid                                                [facility] Adult PT Treatment/Exercise - 04/10/20 0001       Self-Care   Self-Care Other Self-Care Comments    Other Self-Care Comments  making handles, making sure therabands tied correctly at home to help with translation, discussion of filming videos of pt to help with translation of HEP      Shoulder Exercises: Supine   Protraction Strengthening;10 reps   2 sets   Protraction Weight (lbs) 1.5#    Protraction Limitations AAROM with dowel    External Rotation 10 reps   2 sets   External Rotation Limitations AAROM    Flexion AROM;10 reps   2 sets   Shoulder Flexion Weight (lbs) 2    Other Supine Exercises chest press AAROM dowel with 1.5#      Shoulder Exercises: Standing   External Rotation Strengthening;Right;10 reps    Theraband Level (Shoulder External Rotation) Level 1 (Yellow)    Internal Rotation 10  reps   2 sets   Theraband Level (Shoulder Internal Rotation) Level 1 (Yellow)    Row Both;10 reps    Theraband Level (Shoulder Row) Level 1 (Yellow)      Shoulder Exercises: ROM/Strengthening   UBE (Upper Arm Bike) L2 5 min (2.5 fwd/2 min bck)      Manual Therapy   Manual Therapy Joint mobilization;Passive ROM    Joint Mobilization Left GHJ mobs primarily inferior and posterior at various ranges of elevation    Passive ROM Left shoulder elevation, abduction, and ER                          PT Education - 04/10/20 1517     Education Details HEP update    Person(s) Educated Patient    Methods Explanation;Demonstration;Tactile cues;Verbal cues;Handout    Comprehension Verbalized understanding;Returned demonstration;Verbal cues required;Tactile cues required;Need further instruction              PT Short Term Goals - 03/27/20 1541       PT SHORT TERM GOAL #1   Title pt will be I with initial HEP    Baseline verbalizes being compliant with HEP since last visit aside from today due to having an appointment    Time 3    Period Weeks    Status  Achieved    Target Date 03/28/20      PT SHORT TERM GOAL #2   Title PT will go over FOTO by 3rd visit.    Baseline reviewed previously (03/12/2020 per self care)    Time 3    Period Weeks    Status Achieved    Target Date 03/28/20                PT Long Term Goals - 04/08/20 1727       PT LONG TERM GOAL #1   Title Pt will increase FOTO from 46% to 65% to show functional improvement    Time 8    Period Weeks    Status New    Target Date 05/02/20      PT LONG TERM GOAL #2   Title Pt will improve L AROM abduction in supine to 105 to increase functional use of L arm    Baseline L abduction 95 AROM    Time 8    Period Weeks    Status New    Target Date 05/02/20      PT LONG TERM GOAL #3   Title Pt will improve L ER AROM to 35 degrees in order to increase functional use of L UE.    Baseline L ER 25 degrees in scaption plane performed supine    Time 8    Period Weeks    Status New    Target Date 05/02/20      PT LONG TERM GOAL #4   Title Patient will be able to scratch her back (functional IR) as desired and don/doff bra.    Baseline pt at L2 for functional IR    Time 8    Period Weeks    Status New    Target Date 05/02/20      PT LONG TERM GOAL #5   Title Pt will demonstrate overal L shoulder strength of 4/5 in order to increase functional use of the L UE    Time 8    Period Weeks    Status New    Target Date 05/02/20  Plan - 04/10/20 1517     Clinical Impression Statement Pt tolerated tx well with no adverse effects. Discussed filming videos of pt on her phone so that she would have more references to go off of and assures that she is performing the exercises correctly at home. Session focused on increased PROM/AAROM as well as further introductary periscapular strengthening. Handles were made and recreation of home environment was done with periscapular strengthening with the theraband to help with translation over with her HEP  which seemed to help decrease need for cues throughout session. Continues to benefit from skilled therapy in ordre to increase ROM and functional strength of L UE in order to help with overhead mobility and return to PLOF.    PT Treatment/Interventions ADLs/Self Care Home Management;Moist Heat;Electrical Stimulation;Functional mobility training;Therapeutic activities;Therapeutic exercise;Patient/family education;Passive range of motion;Manual techniques;Iontophoresis 4mg /ml Dexamethasone;Cryotherapy;Balance training    PT Next Visit Plan discuss filming her performing exercises to help with retention, dynamic isometric ER (stepping out as maintaining neutral ER/IR), review HEP and progress, slowly progress from external to internal feedback with exercises PRN, progress dowl exercises, manual in all directions, continue to incorporate strengthening PRN, pulleys    PT Home Exercise Plan [email or web address]    Consulted and Agree with Plan of Care Patient             Patient will benefit from skilled therapeutic intervention in order to improve the following deficits and impairments:  Decreased mobility,Decreased strength,Postural dysfunction,Decreased range of motion,Pain,Decreased activity tolerance,Hypomobility  Visit Diagnosis: Chronic left shoulder pain  Stiffness of left shoulder, not elsewhere classified  Abnormal posture  Pain in thoracic spine      Problem List Patient Active Problem List   Diagnosis Date Noted   E. coli UTI 03/20/2018   Hypotension 03/20/2018   Dehydration 03/20/2018   Hypokalemia 03/20/2018   Vitamin B12 deficiency 03/20/2018   Folate deficiency 03/20/2018   Anemia 03/20/2018   Hyperlipidemia 03/19/2018   Acute lower UTI 03/19/2018   Acute metabolic encephalopathy 03/19/2018   Encephalopathy acute 03/19/2018   T8 vertebral fracture, with delayed healing, subsequent encounter 02/15/2015   Osteoporosis    Benign essential tremor     04/15/2015,  SPT 04/10/2020, 4:21 PM  [facility] [address] [place], [place], [place] Phone: [phone]   Fax:  [phone]  Name: Hannah Perez MRN: Jacqulyn Cane Date of Birth: [birth year]

## 2020-04-15 ENCOUNTER — Encounter: Payer: Self-pay | Admitting: Physical Therapy

## 2020-04-15 ENCOUNTER — Ambulatory Visit: Payer: Medicare Other | Attending: Family Medicine | Admitting: Physical Therapy

## 2020-04-15 ENCOUNTER — Other Ambulatory Visit: Payer: Self-pay

## 2020-04-15 DIAGNOSIS — G8929 Other chronic pain: Secondary | ICD-10-CM | POA: Insufficient documentation

## 2020-04-15 DIAGNOSIS — M546 Pain in thoracic spine: Secondary | ICD-10-CM | POA: Diagnosis not present

## 2020-04-15 DIAGNOSIS — M25512 Pain in left shoulder: Secondary | ICD-10-CM | POA: Insufficient documentation

## 2020-04-15 DIAGNOSIS — M25612 Stiffness of left shoulder, not elsewhere classified: Secondary | ICD-10-CM | POA: Insufficient documentation

## 2020-04-15 DIAGNOSIS — R293 Abnormal posture: Secondary | ICD-10-CM | POA: Insufficient documentation

## 2020-04-15 NOTE — Patient Instructions (Signed)
Access Code: [email or web address] URL: [email or web address] Date: 04/15/2020 Prepared by: Jeri Cos  Exercises Supine Shoulder Flexion Extension AAROM with Dowel - 2 x daily - 7 x weekly - 1-2 sets - 10 reps Seated Shoulder External Rotation with Dowel - 2 x daily - 7 x weekly - 1-2 sets - 10 reps Standing Shoulder Abduction AAROM with Dowel - 2 x daily - 7 x weekly - 1-2 sets - 10 reps Standing Shoulder External Rotation Stretch in Doorway - 2 x daily - 7 x weekly - 1-2 sets - 10 reps Shoulder Flexion Wall Slide with Towel - 2 x daily - 7 x weekly - 1-2 sets - 10 reps Supine Shoulder Horizontal Abduction with Resistance - 1-2 x daily - 7 x weekly - 1-2 sets - 10 reps Supine Shoulder Flexion with Free Weight - 1 x daily - 7 x weekly - 2 sets - 10 reps Shoulder External Rotation with Anchored Resistance - 1 x daily - 7 x weekly - 1 sets - 10 reps Standing Row with Anchored Resistance - 1 x daily - 7 x weekly - 2 sets - 10 reps Shoulder Internal Rotation with Resistance - 1 x daily - 7 x weekly - 2 sets - 10 reps

## 2020-04-15 NOTE — Therapy (Addendum)
[facility] Outpatient Rehabilitation [facility] [address] [place], [place], [address] Phone: [phone]   Fax:  [phone]  Physical Therapy Treatment  Patient Details  Name: STEPHENI CAMERON MRN: [id number] Date of Birth: 1950-08-02 Referring Provider (PT): Gweneth Dimitri, MD   Encounter Date: 04/15/2020   PT End of Session - 04/15/20 1449     Visit Number 9    Number of Visits 16    Date for PT Re-Evaluation 05/02/20    Authorization Type MCR/BCBS    Authorization Time Period FOTO by 6th, KX by 15th    Progress Note Due on Visit 10    PT Start Time 1450    PT Stop Time 1534    PT Time Calculation (min) 44 min    Activity Tolerance Patient tolerated treatment well    Behavior During Therapy [facility] for tasks assessed/performed             Past Medical History:  Diagnosis Date   Asthma    trigger from dog hair, dust , grass   Benign essential tremor    Folate deficiency 03/20/2018   Hypercholesteremia    Osteoporosis 2009   on forteo   Vertigo    Vitamin B12 deficiency 03/20/2018    Past Surgical History:  Procedure Laterality Date   CATARACT EXTRACTION Left 04/2012   KYPHOPLASTY     KYPHOPLASTY N/A 02/15/2015   Procedure: Thoracic eight Kyphoplasty;  Surgeon: Barnett Abu, MD;  Location: [facility];  Service: Neurosurgery;  Laterality: N/A;  T8 Kyphoplasty   TONSILLECTOMY     TUBAL LIGATION      There were no vitals filed for this visit.   Subjective Assessment - 04/15/20 1454     Subjective I don't think I am having problems with any of the exercises. I am not having any pain. I was having pain friday after doing grocery shopping. 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    Currently in Pain? Yes    Pain Score 0-No pain    Pain Location Shoulder    Pain Orientation Left    Pain Onset More than a month ago    Pain Frequency Intermittent    Pain Score 0    Pain Location Thoracic    Pain  Orientation Mid    Pain Onset More than a month ago    Pain Frequency Intermittent                                               [facility] Adult PT Treatment/Exercise - 04/15/20 0001       Shoulder Exercises: Supine   External Rotation 10 reps    External Rotation Limitations AAROM, no cueing needed this session    Flexion AROM;10 reps   2 sets   Shoulder Flexion Weight (lbs) 2    Flexion Limitations 45 degree angle incline, no cueing needed    Other Supine Exercises chest presses AAROM dowel with 2# 2x10   some cueing needed     Shoulder Exercises: Standing   External Rotation Strengthening;Right;10 reps    Theraband Level (Shoulder External Rotation) Level 1 (Yellow)    Internal Rotation 10 reps   2 sets   Theraband Level (Shoulder Internal Rotation) Level 1 (Yellow)    Extension 10 reps  Theraband Level (Shoulder Extension) Level 1 (Yellow)    Row Both;10 reps   2 sets   Theraband Level (Shoulder Row) Level 1 (Yellow)      Shoulder Exercises: ROM/Strengthening   UBE (Upper Arm Bike) L2 5 min (2.5 fwd/2 min bck)      Manual Therapy   Manual Therapy Joint mobilization;Passive ROM    Passive ROM Left shoulder elevation, abduction, and ER                          PT Education - 04/15/20 1448     Education Details HEP update    Person(s) Educated Patient    Methods Explanation;Demonstration;Tactile cues;Verbal cues;Handout    Comprehension Verbalized understanding;Returned demonstration;Need further instruction;Verbal cues required;Tactile cues required              PT Short Term Goals - 03/27/20 1541       PT SHORT TERM GOAL #1   Title pt will be I with initial HEP    Baseline verbalizes being compliant with HEP since last visit aside from today due to having an appointment    Time 3    Period Weeks    Status Achieved    Target Date 03/28/20      PT SHORT TERM GOAL #2   Title PT will go over FOTO by 3rd visit.    Baseline  reviewed previously (03/12/2020 per self care)    Time 3    Period Weeks    Status Achieved    Target Date 03/28/20                PT Long Term Goals - 04/08/20 1727       PT LONG TERM GOAL #1   Title Pt will increase FOTO from 46% to 65% to show functional improvement    Time 8    Period Weeks    Status New    Target Date 05/02/20      PT LONG TERM GOAL #2   Title Pt will improve L AROM abduction in supine to 105 to increase functional use of L arm    Baseline L abduction 95 AROM    Time 8    Period Weeks    Status New    Target Date 05/02/20      PT LONG TERM GOAL #3   Title Pt will improve L ER AROM to 35 degrees in order to increase functional use of L UE.    Baseline L ER 25 degrees in scaption plane performed supine    Time 8    Period Weeks    Status New    Target Date 05/02/20      PT LONG TERM GOAL #4   Title Patient will be able to scratch her back (functional IR) as desired and don/doff bra.    Baseline pt at L2 for functional IR    Time 8    Period Weeks    Status New    Target Date 05/02/20      PT LONG TERM GOAL #5   Title Pt will demonstrate overal L shoulder strength of 4/5 in order to increase functional use of the L UE    Time 8    Period Weeks    Status New    Target Date 05/02/20                        Plan -  04/15/20 1641     Clinical Impression Statement Pt tolerated tx well with no adverse effects. Pt only required minimal cueing this session after she was given handles on her therabands last session, so filming demo videos for her was not necessary this session. Increasing functional strength and periscapular strength was further progressed this session. Strengthening against gravity further progressed. Continues to benefit from skilled PT in order to further increase the above impairements and help with overhead mobility to return to PLOF and decrease pain.    PT Treatment/Interventions ADLs/Self Care Home Management;Moist  Heat;Electrical Stimulation;Functional mobility training;Therapeutic activities;Therapeutic exercise;Patient/family education;Passive range of motion;Manual techniques;Iontophoresis 4mg /ml Dexamethasone;Cryotherapy;Balance training    PT Next Visit Plan discuss 1x/week, discuss filming her performing exercises to help with retention, dynamic isometric ER (stepping out as maintaining neutral ER/IR), review HEP and progress, slowly progress from external to internal feedback with exercises PRN, progress dowl exercises, manual in all directions, continue to incorporate strengthening PRN, pulleys, incline flexion    PT Home Exercise Plan [email or web address]    Consulted and Agree with Plan of Care Patient             Patient will benefit from skilled therapeutic intervention in order to improve the following deficits and impairments:  Decreased mobility,Decreased strength,Postural dysfunction,Decreased range of motion,Pain,Decreased activity tolerance,Hypomobility  Visit Diagnosis: Chronic left shoulder pain  Stiffness of left shoulder, not elsewhere classified  Abnormal posture  Pain in thoracic spine      Problem List Patient Active Problem List   Diagnosis Date Noted   E. coli UTI 03/20/2018   Hypotension 03/20/2018   Dehydration 03/20/2018   Hypokalemia 03/20/2018   Vitamin B12 deficiency 03/20/2018   Folate deficiency 03/20/2018   Anemia 03/20/2018   Hyperlipidemia 03/19/2018   Acute lower UTI 03/19/2018   Acute metabolic encephalopathy 03/19/2018   Encephalopathy acute 03/19/2018   T8 vertebral fracture, with delayed healing, subsequent encounter 02/15/2015   Osteoporosis    Benign essential tremor     04/15/2015, SPT 04/15/2020, 4:45 PM  [facility] Outpatient Rehabilitation [facility] [address] [place], [place], [place] Phone: [phone]   Fax:  [phone]  Name: QUEEN ABBETT MRN: Jacqulyn Cane Date of Birth: February 13, 1950

## 2020-04-17 ENCOUNTER — Encounter: Payer: Self-pay | Admitting: Physical Therapy

## 2020-04-17 ENCOUNTER — Ambulatory Visit: Payer: Medicare Other | Admitting: Physical Therapy

## 2020-04-17 ENCOUNTER — Other Ambulatory Visit: Payer: Self-pay

## 2020-04-17 DIAGNOSIS — M25612 Stiffness of left shoulder, not elsewhere classified: Secondary | ICD-10-CM

## 2020-04-17 DIAGNOSIS — M25512 Pain in left shoulder: Secondary | ICD-10-CM | POA: Diagnosis not present

## 2020-04-17 DIAGNOSIS — M546 Pain in thoracic spine: Secondary | ICD-10-CM

## 2020-04-17 DIAGNOSIS — R293 Abnormal posture: Secondary | ICD-10-CM | POA: Diagnosis not present

## 2020-04-17 DIAGNOSIS — G8929 Other chronic pain: Secondary | ICD-10-CM

## 2020-04-17 NOTE — Patient Instructions (Signed)
Access Code: [email or web address] URL: [email or web address] Date: 04/17/2020 Prepared by: Jeri Cos  Exercises Supine Shoulder Flexion Extension AAROM with Dowel - 2 x daily - 7 x weekly - 1-2 sets - 10 reps Standing Shoulder Abduction AAROM with Dowel - 2 x daily - 7 x weekly - 1-2 sets - 10 reps Standing Shoulder External Rotation Stretch in Doorway - 2 x daily - 7 x weekly - 1-2 sets - 10 reps Shoulder Flexion Wall Slide with Towel - 2 x daily - 7 x weekly - 1-2 sets - 10 reps Supine Shoulder Horizontal Abduction with Resistance - 1-2 x daily - 7 x weekly - 1-2 sets - 10 reps Supine Shoulder Flexion with Free Weight - 1 x daily - 7 x weekly - 2 sets - 10 reps Shoulder External Rotation with Anchored Resistance - 1 x daily - 7 x weekly - 1 sets - 10 reps Standing Row with Anchored Resistance - 1 x daily - 7 x weekly - 2 sets - 10 reps Shoulder Internal Rotation with Resistance - 1 x daily - 7 x weekly - 2 sets - 10 reps Supine Shoulder Protraction with Dowel - 1 x daily - 7 x weekly - 2 sets - 10 reps Seated Shoulder External Rotation AAROM with Dowel - 1 x daily - 7 x weekly - 3 sets - 10 reps Standing Shoulder Extension with Dowel - 1 x daily - 7 x weekly - 1 sets - 10 reps Standing Shoulder Internal Rotation AAROM Behind Back with Towel - 1 x daily - 7 x weekly - 1 sets - 10 reps Standing Bilateral Shoulder Internal Rotation AAROM with Dowel - 1 x daily - 7 x weekly - 1 sets - 10 reps Supine Shoulder External Rotation with Dowel - 1 x daily - 7 x weekly - 3 sets - 10 reps

## 2020-04-18 ENCOUNTER — Other Ambulatory Visit: Payer: Self-pay

## 2020-04-18 NOTE — Therapy (Addendum)
[facility] Outpatient Rehabilitation [facility] [address] [place], [place], [address] Phone: [phone]   Fax:  [phone]  Physical Therapy Treatment   Progress Note Reporting Period 03/07/2020 to 04/17/2020  See note below for Objective Data and Assessment of Progress/Goals.     Patient Details  Name: Hannah Perez MRN: [id number] Date of Birth: [birth year] Referring Provider (PT): Gweneth Dimitri, MD   Encounter Date: 04/17/2020   PT End of Session - 04/17/20 1532     Visit Number 10    Number of Visits 16    Date for PT Re-Evaluation 05/02/20    Authorization Type MCR/BCBS    Authorization Time Period FOTO by 6th, KX by 15th    Progress Note Due on Visit 10    PT Start Time 1445    PT Stop Time 1533    PT Time Calculation (min) 48 min    Activity Tolerance Patient tolerated treatment well    Behavior During Therapy [facility] for tasks assessed/performed             Past Medical History:  Diagnosis Date   Asthma    trigger from dog hair, dust , grass   Benign essential tremor    Folate deficiency 03/20/2018   Hypercholesteremia    Osteoporosis 2009   on forteo   Vertigo    Vitamin B12 deficiency 03/20/2018    Past Surgical History:  Procedure Laterality Date   CATARACT EXTRACTION Left 04/2012   KYPHOPLASTY     KYPHOPLASTY N/A 02/15/2015   Procedure: Thoracic eight Kyphoplasty;  Surgeon: Barnett Abu, MD;  Location: [facility];  Service: Neurosurgery;  Laterality: N/A;  T8 Kyphoplasty   TONSILLECTOMY     TUBAL LIGATION      There were no vitals filed for this visit.   Subjective Assessment - 04/17/20 1447     Subjective I can't make Monday appts, so I need to cancel. I think I am ready to go down to 1x/week.    Currently in Pain? No/denies    Pain Location Shoulder    Pain Orientation Left    Pain Type Acute pain    Pain Onset More than a month ago    Pain Frequency Intermittent    Pain Score 0    Pain Location Thoracic                                                [facility] Adult PT Treatment/Exercise - 04/18/20 0001       Shoulder Exercises: Supine   Protraction Strengthening;10 reps    Protraction Limitations AAROM with dowel    Horizontal ABduction 10 reps;Both   2 sets   External Rotation 10 reps;AAROM    Flexion AROM;10 reps;Left;AAROM   2 sets   Shoulder Flexion Weight (lbs) 2    Flexion Limitations AAROM dowel x10, AROM with 2 pound weight x10 on incline      Shoulder Exercises: Standing   External Rotation Strengthening;Right;10 reps    Theraband Level (Shoulder External Rotation) Level 1 (Yellow)    Internal Rotation 10 reps   2 sets   Theraband Level (Shoulder Internal Rotation) Level 1 (Yellow)    Flexion AROM;10 reps    Shoulder Flexion Weight (lbs) --   towel slide on wall   ABduction AAROM;10 reps    ABduction Limitations with dowel  Extension 10 reps    Theraband Level (Shoulder Extension) Level 1 (Yellow)    Row Both;10 reps   2 sets   Theraband Level (Shoulder Row) Level 1 (Yellow)    Other Standing Exercises dowel extension, cross body adduction, and shrug up x10    Other Standing Exercises doorway ER stretch with towel under armpit x10      Shoulder Exercises: ROM/Strengthening   UBE (Upper Arm Bike) L2 5 min (2.5 fwd/2 min bck)      Manual Therapy   Manual Therapy Passive ROM    Passive ROM Left shoulder elevation, abduction, and ER                          PT Education - 04/17/20 1649     Education Details HEP update, explanation of longeivity of continuing exercises after full use of arm to help with thoracic kyphosis pain    Person(s) Educated Patient    Methods Demonstration;Explanation;Handout    Comprehension Verbalized understanding;Returned demonstration;Need further instruction              PT Short Term Goals - 03/27/20 1541       PT SHORT TERM GOAL #1   Title pt will be I with initial HEP    Baseline verbalizes being compliant  with HEP since last visit aside from today due to having an appointment    Time 3    Period Weeks    Status Achieved    Target Date 03/28/20      PT SHORT TERM GOAL #2   Title PT will go over FOTO by 3rd visit.    Baseline reviewed previously (03/12/2020 per self care)    Time 3    Period Weeks    Status Achieved    Target Date 03/28/20                PT Long Term Goals - 04/08/20 1727       PT LONG TERM GOAL #1   Title Pt will increase FOTO from 46% to 65% to show functional improvement    Time 8    Period Weeks    Status New    Target Date 05/02/20      PT LONG TERM GOAL #2   Title Pt will improve L AROM abduction in supine to 105 to increase functional use of L arm    Baseline L abduction 95 AROM    Time 8    Period Weeks    Status New    Target Date 05/02/20      PT LONG TERM GOAL #3   Title Pt will improve L ER AROM to 35 degrees in order to increase functional use of L UE.    Baseline L ER 25 degrees in scaption plane performed supine    Time 8    Period Weeks    Status New    Target Date 05/02/20      PT LONG TERM GOAL #4   Title Patient will be able to scratch her back (functional IR) as desired and don/doff bra.    Baseline pt at L2 for functional IR    Time 8    Period Weeks    Status New    Target Date 05/02/20      PT LONG TERM GOAL #5   Title Pt will demonstrate overal L shoulder strength of 4/5 in order to increase functional use of the L UE  Time 8    Period Weeks    Status New    Target Date 05/02/20                        Plan - 04/18/20 1556     Clinical Impression Statement Pt tolerated tx well with no adverse effects. PT made sure that pt is independent in all HEP so that she can go down to 1x/week. Further periscapular strength and ROM focused on this session and strengthening against gravity further progressed. Continues to benefit from skilled PT in order to further increase the above impareiemtns and help with overhead  mobility to decrease pain and return to PLOF/independence.    PT Treatment/Interventions ADLs/Self Care Home Management;Moist Heat;Electrical Stimulation;Functional mobility training;Therapeutic activities;Therapeutic exercise;Patient/family education;Passive range of motion;Manual techniques;Iontophoresis 4mg /ml Dexamethasone;Cryotherapy;Balance training    PT Next Visit Plan dynamic isometric ER (stepping out as maintaining neutral ER/IR), review HEP and progress, slowly progress from external to internal feedback with exercises PRN, progress dowl exercises, manual in all directions, continue to incorporate strengthening PRN, pulleys, incline flexion    PT Home Exercise Plan [email or web address]    Consulted and Agree with Plan of Care Patient             Patient will benefit from skilled therapeutic intervention in order to improve the following deficits and impairments:  Decreased mobility,Decreased strength,Postural dysfunction,Decreased range of motion,Pain,Decreased activity tolerance,Hypomobility  Visit Diagnosis: Chronic left shoulder pain  Stiffness of left shoulder, not elsewhere classified  Abnormal posture  Pain in thoracic spine      Problem List Patient Active Problem List   Diagnosis Date Noted   E. coli UTI 03/20/2018   Hypotension 03/20/2018   Dehydration 03/20/2018   Hypokalemia 03/20/2018   Vitamin B12 deficiency 03/20/2018   Folate deficiency 03/20/2018   Anemia 03/20/2018   Hyperlipidemia 03/19/2018   Acute lower UTI 03/19/2018   Acute metabolic encephalopathy 03/19/2018   Encephalopathy acute 03/19/2018   T8 vertebral fracture, with delayed healing, subsequent encounter 02/15/2015   Osteoporosis    Benign essential tremor     04/15/2015, SPT 04/18/2020, 4:04 PM  [facility] Outpatient Rehabilitation [facility] [address] [place], [place], [place] Phone: [phone]   Fax:  [phone]  Name: Hannah Perez MRN: Jacqulyn Cane Date of  Birth: 10/24/1950

## 2020-04-22 ENCOUNTER — Ambulatory Visit: Payer: Medicare Other | Admitting: Physical Therapy

## 2020-04-24 ENCOUNTER — Encounter: Payer: Self-pay | Admitting: Physical Therapy

## 2020-04-24 ENCOUNTER — Other Ambulatory Visit: Payer: Self-pay

## 2020-04-24 ENCOUNTER — Ambulatory Visit: Payer: Medicare Other | Admitting: Physical Therapy

## 2020-04-24 DIAGNOSIS — R293 Abnormal posture: Secondary | ICD-10-CM

## 2020-04-24 DIAGNOSIS — M25612 Stiffness of left shoulder, not elsewhere classified: Secondary | ICD-10-CM

## 2020-04-24 DIAGNOSIS — M546 Pain in thoracic spine: Secondary | ICD-10-CM

## 2020-04-24 DIAGNOSIS — M25512 Pain in left shoulder: Secondary | ICD-10-CM | POA: Diagnosis not present

## 2020-04-24 DIAGNOSIS — G8929 Other chronic pain: Secondary | ICD-10-CM

## 2020-04-24 NOTE — Patient Instructions (Signed)
Access Code: [email or web address] URL: [email or web address] Date: 04/24/2020 Prepared by: Rosana Hoes  Exercises Supine Shoulder Flexion Extension AAROM with Dowel - 2 x daily - 7 x weekly - 1-2 sets - 10 reps Standing Shoulder Abduction AAROM with Dowel - 2 x daily - 7 x weekly - 1-2 sets - 10 reps Standing Shoulder External Rotation Stretch in Doorway - 2 x daily - 7 x weekly - 1-2 sets - 10 reps Shoulder Flexion Wall Slide with Towel - 2 x daily - 7 x weekly - 1-2 sets - 10 reps Supine Shoulder Horizontal Abduction with Resistance - 1-2 x daily - 7 x weekly - 1-2 sets - 10 reps Shoulder External Rotation with Anchored Resistance - 1 x daily - 7 x weekly - 1 sets - 10 reps Standing Row with Anchored Resistance - 1 x daily - 7 x weekly - 2 sets - 10 reps Standing Shoulder Extension with Dowel - 1 x daily - 7 x weekly - 1 sets - 10 reps Standing Shoulder Internal Rotation AAROM Behind Back with Towel - 1 x daily - 7 x weekly - 1 sets - 10 reps Standing Bilateral Shoulder Internal Rotation AAROM with Dowel - 1 x daily - 7 x weekly - 1 sets - 10 reps Supine Shoulder External Rotation with Dowel - 1 x daily - 7 x weekly - 3 sets - 10 reps Shoulder Internal Rotation with Resistance - 1 x daily - 7 x weekly - 2 sets - 10 reps Supine Shoulder Flexion with Free Weight - 1 x daily - 7 x weekly - 2 sets - 10 reps

## 2020-04-24 NOTE — Therapy (Addendum)
[facility] [address] [place], [place], [address] Phone: [phone]   Fax:  [phone]  Physical Therapy Treatment  Patient Details  Name: Hannah Perez MRN: [id number] Date of Birth: [birth year] Referring Provider (PT): Gweneth Dimitri, MD   Encounter Date: 04/24/2020   PT End of Session - 04/24/20 1452     Visit Number 11    Number of Visits 16    Date for PT Re-Evaluation 05/02/20    Authorization Time Period FOTO by 6th, KX by 15th    Progress Note Due on Visit 20    PT Start Time 1447    PT Stop Time 1536    PT Time Calculation (min) 49 min    Activity Tolerance Patient tolerated treatment well    Behavior During Therapy [facility] for tasks assessed/performed             Past Medical History:  Diagnosis Date   Asthma    trigger from dog hair, dust , grass   Benign essential tremor    Folate deficiency 03/20/2018   Hypercholesteremia    Osteoporosis 2009   on forteo   Vertigo    Vitamin B12 deficiency 03/20/2018    Past Surgical History:  Procedure Laterality Date   CATARACT EXTRACTION Left 04/2012   KYPHOPLASTY     KYPHOPLASTY N/A 02/15/2015   Procedure: Thoracic eight Kyphoplasty;  Surgeon: Barnett Abu, MD;  Location: [facility];  Service: Neurosurgery;  Laterality: N/A;  T8 Kyphoplasty   TONSILLECTOMY     TUBAL LIGATION      There were no vitals filed for this visit.   Subjective Assessment - 04/24/20 1449     Subjective My back hurts because I blew leaves out of the garage yesterday, but the shoulder is feeling okay. I didn't do the exercises last night. I can't do the one pushing my shoulder blades up off the table, it hurts too much.    Currently in Pain? Yes    Pain Score 0-No pain    Pain Location Shoulder    Pain Orientation Left    Multiple Pain Sites Yes    Pain Score 3    Pain Location Thoracic    Pain Orientation Mid                  [facility] PT Assessment - 04/24/20 0001        AROM   Overall AROM Comments AAROM L shoulder abduction with dowel 90 degrees    Left Shoulder Flexion 110 Degrees    Left Shoulder ABduction 87 Degrees      PROM   Left Shoulder Flexion 138 Degrees    Left Shoulder ABduction 90 Degrees    Left Shoulder External Rotation 35 Degrees                                        [facility] Adult PT Treatment/Exercise - 04/24/20 0001       Self-Care   Self-Care Other Self-Care Comments    Other Self-Care Comments  AROM/PROM measurements      Shoulder Exercises: Supine   Other Supine Exercises chest press dowel x10, chest press 1# 2x10      Shoulder Exercises: Standing   External Rotation Both;10 reps   2 sets   Theraband Level (Shoulder External Rotation) Level 1 (Yellow)    Internal Rotation 10 reps  2 sets   Theraband Level (Shoulder Internal Rotation) Level 1 (Yellow);Level 2 (Red)    ABduction AROM;10 reps;Left   shortened lever arm   Extension 10 reps   2 sets   Theraband Level (Shoulder Extension) Level 1 (Yellow)    Row 10 reps   2 sets   Theraband Level (Shoulder Row) Level 1 (Yellow)    Other Standing Exercises dowel extension, cross body adduction, and shrug up x10   T8 but L lateral on ribs     Shoulder Exercises: ROM/Strengthening   UBE (Upper Arm Bike) L2 4 min (2 fwd/2 min bck)      Manual Therapy   Manual Therapy Passive ROM;Joint mobilization    Joint Mobilization long axis posterior mobs in flexion grade II-III   painful after   Passive ROM Left shoulder elevation, abduction, and ER                          PT Education - 04/24/20 1649     Education Details HEP update    Person(s) Educated Patient    Methods Explanation;Handout;Demonstration;Verbal cues    Comprehension Verbalized understanding;Returned demonstration;Verbal cues required;Need further instruction              PT Short Term Goals - 03/27/20 1541       PT SHORT TERM GOAL #1   Title pt will be I with initial HEP     Baseline verbalizes being compliant with HEP since last visit aside from today due to having an appointment    Time 3    Period Weeks    Status Achieved    Target Date 03/28/20      PT SHORT TERM GOAL #2   Title PT will go over FOTO by 3rd visit.    Baseline reviewed previously (03/12/2020 per self care)    Time 3    Period Weeks    Status Achieved    Target Date 03/28/20                PT Long Term Goals - 04/08/20 1727       PT LONG TERM GOAL #1   Title Pt will increase FOTO from 46% to 65% to show functional improvement    Time 8    Period Weeks    Status New    Target Date 05/02/20      PT LONG TERM GOAL #2   Title Pt will improve L AROM abduction in supine to 105 to increase functional use of L arm    Baseline L abduction 95 AROM    Time 8    Period Weeks    Status New    Target Date 05/02/20      PT LONG TERM GOAL #3   Title Pt will improve L ER AROM to 35 degrees in order to increase functional use of L UE.    Baseline L ER 25 degrees in scaption plane performed supine    Time 8    Period Weeks    Status New    Target Date 05/02/20      PT LONG TERM GOAL #4   Title Patient will be able to scratch her back (functional IR) as desired and don/doff bra.    Baseline pt at L2 for functional IR    Time 8    Period Weeks    Status New    Target Date 05/02/20      PT  LONG TERM GOAL #5   Title Pt will demonstrate overal L shoulder strength of 4/5 in order to increase functional use of the L UE    Time 8    Period Weeks    Status New    Target Date 05/02/20                        Plan - 04/24/20 1632     Clinical Impression Statement Pt tolerated tx well with no adverse effects. AROM/PROM measurements taken this session to measure progress. ER PROM improved by 8 degrees, PROM shoulder abduction decreased by 7 degrees, PROM shoulder flexion decreased by 2 degrees, L shoulder flexion decreased by 25 degrees, shoulder abduction decreased by 8  degrees. Decrease could be due to pt's thoracic pain flare today as well as thoracic kyphosis. However, pt subjectively reports that L shoulder is increasing in ROM and pain is decreasing. Periscapular strengthening progressed this session with performing incline flexion and increasing resistance on IR. Pt would continue to benefit from skilled PT in order to increase ROM, increase periscapular strengthening, and work on general overhead mobility in order to return to PLOF and decrease L shoulder pain.    PT Treatment/Interventions ADLs/Self Care Home Management;Moist Heat;Electrical Stimulation;Functional mobility training;Therapeutic activities;Therapeutic exercise;Patient/family education;Passive range of motion;Manual techniques;Iontophoresis 4mg /ml Dexamethasone;Cryotherapy;Balance training    PT Next Visit Plan retake AROM/PROM measurements in a couple of sessions when thoracic calmed down, review HEP and progress, slowly progress from external to internal feedback with exercises PRN, progress dowl exercises, manual in all directions, continue to incorporate strengthening PRN, pulleys, incline flexion    PT Home Exercise Plan [id number]    Consulted and Agree with Plan of Care Patient             Patient will benefit from skilled therapeutic intervention in order to improve the following deficits and impairments:  Decreased mobility,Decreased strength,Postural dysfunction,Decreased range of motion,Pain,Decreased activity tolerance,Hypomobility  Visit Diagnosis: Chronic left shoulder pain  Stiffness of left shoulder, not elsewhere classified  Abnormal posture  Pain in thoracic spine      Problem List Patient Active Problem List   Diagnosis Date Noted   E. coli UTI 03/20/2018   Hypotension 03/20/2018   Dehydration 03/20/2018   Hypokalemia 03/20/2018   Vitamin B12 deficiency 03/20/2018   Folate deficiency 03/20/2018   Anemia 03/20/2018   Hyperlipidemia 03/19/2018   Acute lower  UTI 03/19/2018   Acute metabolic encephalopathy 03/19/2018   Encephalopathy acute 03/19/2018   T8 vertebral fracture, with delayed healing, subsequent encounter 02/15/2015   Osteoporosis    Benign essential tremor     04/15/2015, SPT 04/24/2020, 4:51 PM  [facility] [address] [place], [place], [place] Phone: [phone]   Fax:  [phone]  Name: Hannah Perez MRN: Jacqulyn Cane Date of Birth: 1950/01/25

## 2020-04-29 ENCOUNTER — Ambulatory Visit: Payer: Medicare Other | Admitting: Physical Therapy

## 2020-05-01 ENCOUNTER — Encounter: Payer: Self-pay | Admitting: Physical Therapy

## 2020-05-01 ENCOUNTER — Other Ambulatory Visit: Payer: Self-pay

## 2020-05-01 ENCOUNTER — Ambulatory Visit: Payer: Medicare Other | Admitting: Physical Therapy

## 2020-05-01 DIAGNOSIS — M25612 Stiffness of left shoulder, not elsewhere classified: Secondary | ICD-10-CM | POA: Diagnosis not present

## 2020-05-01 DIAGNOSIS — R293 Abnormal posture: Secondary | ICD-10-CM | POA: Diagnosis not present

## 2020-05-01 DIAGNOSIS — G8929 Other chronic pain: Secondary | ICD-10-CM | POA: Diagnosis not present

## 2020-05-01 DIAGNOSIS — M25512 Pain in left shoulder: Secondary | ICD-10-CM

## 2020-05-01 DIAGNOSIS — M546 Pain in thoracic spine: Secondary | ICD-10-CM | POA: Diagnosis not present

## 2020-05-01 NOTE — Therapy (Signed)
[place], [place], [address] Phone: [phone]   Fax:  [phone]  Physical Therapy Treatment / ERO  Patient Details  Name: Hannah Perez MRN: [id number] Date of Birth: Feb 27, 1950 Referring Provider (PT): Cari Caraway, MD   Encounter Date: 05/01/2020   PT End of Session - 05/01/20 1445    Visit Number 12    Number of Visits 16    Date for PT Re-Evaluation 05/29/20    Authorization Type MCR/BCBS    Authorization Time Period KX by 15th    Progress Note Due on Visit 20    PT Start Time 1445    PT Stop Time 1525    PT Time Calculation (min) 40 min    Activity Tolerance Patient tolerated treatment well    Behavior During Therapy [facility] for tasks assessed/performed           Past Medical History:  Diagnosis Date  . Asthma    trigger from dog hair, dust , grass  . Benign essential tremor   . Folate deficiency 03/20/2018  . Hypercholesteremia   . Osteoporosis 2009   on forteo  . Vertigo   . Vitamin B12 deficiency 03/20/2018    Past Surgical History:  Procedure Laterality Date  . CATARACT EXTRACTION Left 04/2012  . KYPHOPLASTY    . KYPHOPLASTY N/A 02/15/2015   Procedure: Thoracic eight Kyphoplasty;  Surgeon: Kristeen Miss, MD;  Location: [place] [facility];  Service: Neurosurgery;  Laterality: N/A;  T8 Kyphoplasty  . TONSILLECTOMY    . TUBAL LIGATION      There were no vitals filed for this visit.   Subjective Assessment - 05/01/20 1448    Subjective Patient reports she is doing well, she bought some 2# weights and they are tough. Overal she feel she has made progress.    Limitations Lifting;House hold activities    Patient Stated Goals 'not hurt', move it normally    Currently in Pain? No/denies    Pain Location Shoulder    Pain Orientation Left              [facility] PT Assessment - 05/01/20 0001      Assessment   Medical Diagnosis Adhesive capsulitis of left shoulder and chronic thoracic back  pain due to osteoarthritis    Referring Provider (PT) Cari Caraway, MD      Precautions   Precautions None      Restrictions   Weight Bearing Restrictions No      Balance Screen   Has the patient fallen in the past 6 months No      Prior Function   Level of Independence Independent      Observation/Other Assessments   Focus on Therapeutic Outcomes (FOTO)  60% functional status      AROM   Left Shoulder Flexion 110 Degrees    Left Shoulder ABduction 90 Degrees    Left Shoulder Internal Rotation --   functional reach to L1     PROM   Left Shoulder Flexion 145 Degrees    Left Shoulder ABduction 90 Degrees    Left Shoulder External Rotation 45 Degrees      Strength   Left Shoulder Flexion 4/5    Left Shoulder Extension 4/5    Left Shoulder ABduction 4-/5    Left Shoulder Internal Rotation 4+/5    Left Shoulder External Rotation 4/5  [facility] Adult PT Treatment/Exercise - 05/01/20 0001      Exercises   Exercises Shoulder      Shoulder Exercises: Supine   Flexion Limitations AAROM dowel x10, with 2 pound dumbbell x10      Shoulder Exercises: Standing   External Rotation 10 reps    Theraband Level (Shoulder External Rotation) Level 1 (Yellow)    Internal Rotation 10 reps    Theraband Level (Shoulder Internal Rotation) Level 1 (Yellow)    Extension 10 reps    Theraband Level (Shoulder Extension) Level 1 (Yellow)    Row 10 reps    Theraband Level (Shoulder Row) Level 2 (Red)      Shoulder Exercises: ROM/Strengthening   UBE (Upper Arm Bike) L2 x 4 min (fwd/bwd)      Manual Therapy   Manual Therapy Passive ROM;Joint mobilization    Joint Mobilization GHJ inferior/posterior at various ranges of elevation    Passive ROM Left shoulder elevation, abduction, and ER                  PT Education - 05/01/20 1444    Education Details POC update, exam findings, FOTO, HEP    Person(s) Educated Patient    Methods Explanation     Comprehension Verbalized understanding;Need further instruction            PT Short Term Goals - 03/27/20 1541      PT SHORT TERM GOAL #1   Title pt will be I with initial HEP    Baseline verbalizes being compliant with HEP since last visit aside from today due to having an appointment    Time 3    Period Weeks    Status Achieved    Target Date 03/28/20      PT SHORT TERM GOAL #2   Title PT will go over FOTO by 3rd visit.    Baseline reviewed previously (03/12/2020 per self care)    Time 3    Period Weeks    Status Achieved    Target Date 03/28/20             PT Long Term Goals - 05/01/20 1457      PT LONG TERM GOAL #1   Title Pt will increase FOTO from 46% to 65% to show functional improvement    Time 4    Period Weeks    Status On-going    Target Date 05/29/20      PT LONG TERM GOAL #2   Title Pt will improve L AROM abduction in supine to 105 to increase functional use of L arm    Baseline L abduction 95 AROM    Time 4    Period Weeks    Status On-going    Target Date 05/29/20      PT LONG TERM GOAL #3   Title Pt will improve L ER AROM to 35 degrees in order to increase functional use of L UE.    Baseline L ER 35 degrees    Time --    Period --    Status Achieved      PT LONG TERM GOAL #4   Title Patient will be able to scratch her back (functional IR) as desired and don/doff bra.    Baseline L1    Time 4    Period Weeks    Status On-going    Target Date 05/29/20      PT LONG TERM GOAL #5   Title Pt will demonstrate  overal L shoulder strength of 4/5 in order to increase functional use of the L UE    Time 4    Period Weeks    Status On-going    Target Date 05/29/20                 Plan - 05/01/20 1455    Clinical Impression Statement Patient tolerated therapy well with no adverse effects. She demonstrates an improvement in her A/PROM and strength this visit, as well as self reported improvement in self report function via FOTO. She has  not quite met LTGs so with continue with therapy 1x/week for 4 more weeks to continue progress and finalize HEP.  She would benefit from continued skilled PT in order to increase ROM, increase periscapular strengthening, and work on general overhead mobility in order to return to PLOF and decrease L shoulder pain.    PT Frequency 1x / week    PT Duration 4 weeks    PT Treatment/Interventions ADLs/Self Care Home Management;Moist Heat;Electrical Stimulation;Functional mobility training;Therapeutic activities;Therapeutic exercise;Patient/family education;Passive range of motion;Manual techniques;Iontophoresis 55m/ml Dexamethasone;Cryotherapy;Balance training    PT Next Visit Plan Review HEP and progress, slowly progress from external to internal feedback with exercises PRN, progress dowl exercises, manual in all directions, continue to incorporate strengthening PRN, pulleys, incline flexion    PT Home Exercise Plan [id number]    Consulted and Agree with Plan of Care Patient           Patient will benefit from skilled therapeutic intervention in order to improve the following deficits and impairments:  Decreased mobility,Decreased strength,Postural dysfunction,Decreased range of motion,Pain,Decreased activity tolerance,Hypomobility  Visit Diagnosis: Chronic left shoulder pain  Stiffness of left shoulder, not elsewhere classified  Abnormal posture  Pain in thoracic spine     Problem List Patient Active Problem List   Diagnosis Date Noted  . E. coli UTI 03/20/2018  . Hypotension 03/20/2018  . Dehydration 03/20/2018  . Hypokalemia 03/20/2018  . Vitamin B12 deficiency 03/20/2018  . Folate deficiency 03/20/2018  . Anemia 03/20/2018  . Hyperlipidemia 03/19/2018  . Acute lower UTI 03/19/2018  . Acute metabolic encephalopathy 074/14/2395 . Encephalopathy acute 03/19/2018  . T8 vertebral fracture, with delayed healing, subsequent encounter 02/15/2015  . Osteoporosis   . Benign essential  tremor     CHilda Blades PT, DPT, LAT, ATC 05/01/20  3:30 PM Phone: [phone]: 3[facility] [place] [address]Phone: [phone]  Fax:  [phone] Name: LJANARI GAGNERMRN: [id number] of Birth: [birth year]

## 2020-05-06 ENCOUNTER — Encounter: Payer: Medicare Other | Admitting: Physical Therapy

## 2020-05-08 ENCOUNTER — Ambulatory Visit: Payer: Medicare Other | Admitting: Physical Therapy

## 2020-05-08 ENCOUNTER — Encounter: Payer: Self-pay | Admitting: Physical Therapy

## 2020-05-08 ENCOUNTER — Other Ambulatory Visit: Payer: Self-pay

## 2020-05-08 DIAGNOSIS — M25512 Pain in left shoulder: Secondary | ICD-10-CM

## 2020-05-08 DIAGNOSIS — R293 Abnormal posture: Secondary | ICD-10-CM

## 2020-05-08 DIAGNOSIS — M546 Pain in thoracic spine: Secondary | ICD-10-CM

## 2020-05-08 DIAGNOSIS — G8929 Other chronic pain: Secondary | ICD-10-CM

## 2020-05-08 DIAGNOSIS — M25612 Stiffness of left shoulder, not elsewhere classified: Secondary | ICD-10-CM | POA: Diagnosis not present

## 2020-05-08 NOTE — Therapy (Addendum)
[facility] [address] [place], [place], [address] Phone: [phone]   Fax:  [phone]  Physical Therapy Treatment  Patient Details  Name: Hannah Perez MRN: [id number] Date of Birth: 1950-11-29 Referring Provider (PT): Gweneth Dimitri, MD   Encounter Date: 05/08/2020   PT End of Session - 05/08/20 1454     Visit Number 13    Number of Visits 16    Date for PT Re-Evaluation 05/29/20    Authorization Type MCR/BCBS    Authorization Time Period KX by 15th    Progress Note Due on Visit 20    PT Start Time 1445    PT Stop Time 1530    PT Time Calculation (min) 45 min    Activity Tolerance Patient tolerated treatment well    Behavior During Therapy [facility] for tasks assessed/performed             Past Medical History:  Diagnosis Date   Asthma    trigger from dog hair, dust , grass   Benign essential tremor    Folate deficiency 03/20/2018   Hypercholesteremia    Osteoporosis 2009   on forteo   Vertigo    Vitamin B12 deficiency 03/20/2018    Past Surgical History:  Procedure Laterality Date   CATARACT EXTRACTION Left 04/2012   KYPHOPLASTY     KYPHOPLASTY N/A 02/15/2015   Procedure: Thoracic eight Kyphoplasty;  Surgeon: Barnett Abu, MD;  Location: [facility];  Service: Neurosurgery;  Laterality: N/A;  T8 Kyphoplasty   TONSILLECTOMY     TUBAL LIGATION      There were no vitals filed for this visit.   Subjective Assessment - 05/08/20 1455     Subjective Back and shoulder are feeling fine, no pain. Continues to state she feels like she is making progress.    Currently in Pain? No/denies                                               [facility] Adult PT Treatment/Exercise - 05/08/20 0001       Shoulder Exercises: Supine   Horizontal ABduction 10 reps   2 sets   Theraband Level (Shoulder Horizontal ABduction) Level 2 (Red)    Flexion Limitations AAROM dowel x10, with 2 pound dumbbell x10    Other  Supine Exercises chest press 2# 2x10      Shoulder Exercises: Standing   External Rotation 10 reps    Theraband Level (Shoulder External Rotation) Level 2 (Red)    Internal Rotation 10 reps   2 sets   Theraband Level (Shoulder Internal Rotation) Level 2 (Red)    ABduction AROM;10 reps;Left    ABduction Limitations long lever arm    Row 12 reps    Theraband Level (Shoulder Row) Level 2 (Red)    Other Standing Exercises dowel extension, cross body adduction, and shrug up x10      Shoulder Exercises: ROM/Strengthening   UBE (Upper Arm Bike) L2 x 4.5 min (fwd/bwd)      Manual Therapy   Manual Therapy Passive ROM;Joint mobilization    Joint Mobilization GHJ inferior/posterior at various ranges of elevation    Passive ROM Left shoulder elevation, abduction, and ER                          PT Education -  05/08/20 1801     Education Details HEP update    Person(s) Educated Patient    Methods Explanation;Demonstration;Handout    Comprehension Verbalized understanding;Returned demonstration;Need further instruction              PT Short Term Goals - 03/27/20 1541       PT SHORT TERM GOAL #1   Title pt will be I with initial HEP    Baseline verbalizes being compliant with HEP since last visit aside from today due to having an appointment    Time 3    Period Weeks    Status Achieved    Target Date 03/28/20      PT SHORT TERM GOAL #2   Title PT will go over FOTO by 3rd visit.    Baseline reviewed previously (03/12/2020 per self care)    Time 3    Period Weeks    Status Achieved    Target Date 03/28/20                PT Long Term Goals - 05/01/20 1457       PT LONG TERM GOAL #1   Title Pt will increase FOTO from 46% to 65% to show functional improvement    Time 4    Period Weeks    Status On-going    Target Date 05/29/20      PT LONG TERM GOAL #2   Title Pt will improve L AROM abduction in supine to 105 to increase functional use of L arm     Baseline L abduction 95 AROM    Time 4    Period Weeks    Status On-going    Target Date 05/29/20      PT LONG TERM GOAL #3   Title Pt will improve L ER AROM to 35 degrees in order to increase functional use of L UE.    Baseline L ER 35 degrees    Time --    Period --    Status Achieved      PT LONG TERM GOAL #4   Title Patient will be able to scratch her back (functional IR) as desired and don/doff bra.    Baseline L1    Time 4    Period Weeks    Status On-going    Target Date 05/29/20      PT LONG TERM GOAL #5   Title Pt will demonstrate overal L shoulder strength of 4/5 in order to increase functional use of the L UE    Time 4    Period Weeks    Status On-going    Target Date 05/29/20                        Plan - 05/08/20 1801     Clinical Impression Statement Pt tolerated tx well with no adverse effects. Able to perform all supine exercises without dowel now and instead with dumbell weights and theraband exercises progressed to red. Long lever arm shoulder abduction introduced this session with good tolerance. Continues to benefit from skilled PT in order to address functional mobility and strength deficits in L UE in order to increase overhead activity, decrease pain, and return to PLOF.    PT Treatment/Interventions ADLs/Self Care Home Management;Moist Heat;Electrical Stimulation;Functional mobility training;Therapeutic activities;Therapeutic exercise;Patient/family education;Passive range of motion;Manual techniques;Iontophoresis 4mg /ml Dexamethasone;Cryotherapy;Balance training    PT Next Visit Plan Review HEP and progress, slowly progress from external to internal feedback with exercises  PRN, progress dowl exercises, manual in all directions, continue to incorporate strengthening PRN, pulleys, incline flexion    PT Home Exercise Plan [id number]    Consulted and Agree with Plan of Care Patient             Patient will benefit from skilled therapeutic  intervention in order to improve the following deficits and impairments:  Decreased mobility,Decreased strength,Postural dysfunction,Decreased range of motion,Pain,Decreased activity tolerance,Hypomobility  Visit Diagnosis: Chronic left shoulder pain  Stiffness of left shoulder, not elsewhere classified  Abnormal posture  Pain in thoracic spine      Problem List Patient Active Problem List   Diagnosis Date Noted   E. coli UTI 03/20/2018   Hypotension 03/20/2018   Dehydration 03/20/2018   Hypokalemia 03/20/2018   Vitamin B12 deficiency 03/20/2018   Folate deficiency 03/20/2018   Anemia 03/20/2018   Hyperlipidemia 03/19/2018   Acute lower UTI 03/19/2018   Acute metabolic encephalopathy 03/19/2018   Encephalopathy acute 03/19/2018   T8 vertebral fracture, with delayed healing, subsequent encounter 02/15/2015   Osteoporosis    Benign essential tremor     Jeri Cos, SPT 05/08/2020, 6:05 PM  [facility] [address] [place], [place], [address] Phone: [phone]   Fax:  [phone]  Name: GELENE RECKTENWALD MRN: [id number] Date of Birth: April 10, 1950

## 2020-05-08 NOTE — Patient Instructions (Signed)
Access Code: [email or web address] URL: [email or web address] Date: 05/08/2020 Prepared by: Jeri Cos  Exercises  Shoulder Flexion Wall Slide with Towel - 2 x daily - 7 x weekly - 1-2 sets - 10 reps Supine Shoulder External Rotation with Dowel - 1 x daily - 7 x weekly - 3 sets - 10 reps Supine Shoulder Horizontal Abduction with Resistance - 1-2 x daily - 7 x weekly - 1-2 sets - 10 reps Shoulder External Rotation with Anchored Resistance - 1 x daily - 7 x weekly - 1 sets - 10 reps Standing Row with Anchored Resistance - 1 x daily - 7 x weekly - 2 sets - 10 reps Standing Shoulder Extension with Dowel - 1 x daily - 7 x weekly - 1 sets - 10 reps Standing Shoulder Internal Rotation AAROM Behind Back with Towel - 1 x daily - 7 x weekly - 1 sets - 10 reps Standing Bilateral Shoulder Internal Rotation AAROM with Dowel - 1 x daily - 7 x weekly - 1 sets - 10 reps Shoulder Internal Rotation with Resistance - 1 x daily - 7 x weekly - 2 sets - 10 reps Shoulder Abduction - Thumbs Up - 1 x daily - 7 x weekly - 3 sets - 10 reps Supine Shoulder Flexion with Free Weight - 1 x daily - 7 x weekly - 2 sets - 10 reps

## 2020-05-22 ENCOUNTER — Encounter: Payer: Self-pay | Admitting: Physical Therapy

## 2020-05-22 ENCOUNTER — Ambulatory Visit: Payer: Medicare Other | Attending: Family Medicine | Admitting: Physical Therapy

## 2020-05-22 ENCOUNTER — Other Ambulatory Visit: Payer: Self-pay

## 2020-05-22 DIAGNOSIS — M25612 Stiffness of left shoulder, not elsewhere classified: Secondary | ICD-10-CM | POA: Diagnosis not present

## 2020-05-22 DIAGNOSIS — G8929 Other chronic pain: Secondary | ICD-10-CM | POA: Diagnosis not present

## 2020-05-22 DIAGNOSIS — R293 Abnormal posture: Secondary | ICD-10-CM | POA: Diagnosis not present

## 2020-05-22 DIAGNOSIS — M25512 Pain in left shoulder: Secondary | ICD-10-CM | POA: Diagnosis not present

## 2020-05-22 DIAGNOSIS — M546 Pain in thoracic spine: Secondary | ICD-10-CM

## 2020-05-22 NOTE — Therapy (Signed)
[facility] [address] [place], [place], [address] Phone: [phone]   Fax:  [phone]  Physical Therapy Treatment  Patient Details  Name: Hannah Perez MRN: [id number] Date of Birth: 1950/10/11 Referring Provider (PT): Gweneth Dimitri, MD   Encounter Date: 05/22/2020   PT End of Session - 05/22/20 1401    Visit Number 14    Number of Visits 16    Date for PT Re-Evaluation 05/29/20    Authorization Type MCR/BCBS    Authorization Time Period KX by 15th    Progress Note Due on Visit 20    PT Start Time 1400    PT Stop Time 1440    PT Time Calculation (min) 40 min    Activity Tolerance Patient tolerated treatment well    Behavior During Therapy [facility] for tasks assessed/performed           Past Medical History:  Diagnosis Date  . Asthma    trigger from dog hair, dust , grass  . Benign essential tremor   . Folate deficiency 03/20/2018  . Hypercholesteremia   . Osteoporosis 2009   on forteo  . Vertigo   . Vitamin B12 deficiency 03/20/2018    Past Surgical History:  Procedure Laterality Date  . CATARACT EXTRACTION Left 04/2012  . KYPHOPLASTY    . KYPHOPLASTY N/A 02/15/2015   Procedure: Thoracic eight Kyphoplasty;  Surgeon: Barnett Abu, MD;  Location: [facility];  Service: Neurosurgery;  Laterality: N/A;  T8 Kyphoplasty  . TONSILLECTOMY    . TUBAL LIGATION      There were no vitals filed for this visit.   Subjective Assessment - 05/22/20 1404    Subjective Patient reports she is doing well. States there are a couple exercises that she doesnt't feel like are progressing    Patient Stated Goals 'not hurt', move it normally    Currently in Pain? No/denies              [facility] PT Assessment - 05/22/20 0001      AROM   Left Shoulder Flexion 123 Degrees    Left Shoulder ABduction 90 Degrees    Left Shoulder Internal Rotation --   T12     PROM   Left Shoulder Flexion 145 Degrees    Left Shoulder ABduction 100  Degrees                         [facility] Adult PT Treatment/Exercise - 05/22/20 0001      Exercises   Exercises Shoulder      Shoulder Exercises: Supine   Flexion 10 reps   3 sets   Theraband Level (Shoulder Flexion) Level 1 (Yellow)   2nd and 3rd set   Shoulder Flexion Weight (lbs) 2   1st set     Shoulder Exercises: Sidelying   ABduction 10 reps   3 sets   ABduction Weight (lbs) 2   2nd and 3rd set     Shoulder Exercises: Standing   External Rotation 10 reps    Theraband Level (Shoulder External Rotation) Level 2 (Red)    Internal Rotation 10 reps    Theraband Level (Shoulder Internal Rotation) Level 2 (Red)    Flexion 10 reps    ABduction 10 reps    Row 10 reps    Theraband Level (Shoulder Row) Level 2 (Red)      Shoulder Exercises: ROM/Strengthening   UBE (Upper Arm Bike) L2 x 4 min (  2 fwd/bwd)      Shoulder Exercises: Stretch   Internal Rotation Stretch Limitations Functional IR behind back well arm assist 2 x 10 with 5 sec hold    Wall Stretch - Flexion Limitations x 10      Manual Therapy   Manual Therapy Passive ROM;Joint mobilization    Joint Mobilization GHJ inferior/posterior at various ranges of elevation    Passive ROM Left shoulder all planes of motion                  PT Education - 05/22/20 1401    Education Details HEP update    Person(s) Educated Patient    Methods Explanation;Demonstration;Tactile cues;Verbal cues;Handout    Comprehension Verbalized understanding;Need further instruction;Returned demonstration;Verbal cues required;Tactile cues required            PT Short Term Goals - 03/27/20 1541      PT SHORT TERM GOAL #1   Title pt will be I with initial HEP    Baseline verbalizes being compliant with HEP since last visit aside from today due to having an appointment    Time 3    Period Weeks    Status Achieved    Target Date 03/28/20      PT SHORT TERM GOAL #2   Title PT will go over FOTO by 3rd visit.     Baseline reviewed previously (03/12/2020 per self care)    Time 3    Period Weeks    Status Achieved    Target Date 03/28/20             PT Long Term Goals - 05/01/20 1457      PT LONG TERM GOAL #1   Title Pt will increase FOTO from 46% to 65% to show functional improvement    Time 4    Period Weeks    Status On-going    Target Date 05/29/20      PT LONG TERM GOAL #2   Title Pt will improve L AROM abduction in supine to 105 to increase functional use of L arm    Baseline L abduction 95 AROM    Time 4    Period Weeks    Status On-going    Target Date 05/29/20      PT LONG TERM GOAL #3   Title Pt will improve L ER AROM to 35 degrees in order to increase functional use of L UE.    Baseline L ER 35 degrees    Time --    Period --    Status Achieved      PT LONG TERM GOAL #4   Title Patient will be able to scratch her back (functional IR) as desired and don/doff bra.    Baseline L1    Time 4    Period Weeks    Status On-going    Target Date 05/29/20      PT LONG TERM GOAL #5   Title Pt will demonstrate overal L shoulder strength of 4/5 in order to increase functional use of the L UE    Time 4    Period Weeks    Status On-going    Target Date 05/29/20                 Plan - 05/22/20 1402    Clinical Impression Statement Patient tolerated therapy well with no adverse effects. Therapy focused on continued manual and stretching for shoulder motion and progressive strengthening to improve reach overhead.  She does demonstrate improved motion this visit and seems to be progressing well with exercises. Updated HEP this visit to continue strengthening and progres reach behind back. She would benefit from continued skilled therapy to progress motion and strength in order to return to PLOF and decrease L shoulder pain.    PT Treatment/Interventions ADLs/Self Care Home Management;Moist Heat;Electrical Stimulation;Functional mobility training;Therapeutic  activities;Therapeutic exercise;Patient/family education;Passive range of motion;Manual techniques;Iontophoresis 4mg /ml Dexamethasone;Cryotherapy;Balance training    PT Next Visit Plan Review HEP and progress, manual and stretching for shoulder mobility, progress strength and overhead reach, functional reach behind back    PT Home Exercise Plan [email or web address]    Consulted and Agree with Plan of Care Patient           Patient will benefit from skilled therapeutic intervention in order to improve the following deficits and impairments:  Decreased mobility,Decreased strength,Postural dysfunction,Decreased range of motion,Pain,Decreased activity tolerance,Hypomobility  Visit Diagnosis: Chronic left shoulder pain  Stiffness of left shoulder, not elsewhere classified  Abnormal posture  Pain in thoracic spine     Problem List Patient Active Problem List   Diagnosis Date Noted  . E. coli UTI 03/20/2018  . Hypotension 03/20/2018  . Dehydration 03/20/2018  . Hypokalemia 03/20/2018  . Vitamin B12 deficiency 03/20/2018  . Folate deficiency 03/20/2018  . Anemia 03/20/2018  . Hyperlipidemia 03/19/2018  . Acute lower UTI 03/19/2018  . Acute metabolic encephalopathy 03/19/2018  . Encephalopathy acute 03/19/2018  . T8 vertebral fracture, with delayed healing, subsequent encounter 02/15/2015  . Osteoporosis   . Benign essential tremor     04/15/2015, PT, DPT, LAT, ATC 05/22/20  5:21 PM Phone: [phone] Fax: [phone]   [facility] [address] [place], [place], [place] Phone: [phone]   Fax:  [phone]  Name: DALISHA SHIVELY MRN: Jacqulyn Cane Date of Birth: Jul 08, 1950

## 2020-05-22 NOTE — Patient Instructions (Signed)
Access Code: [email or web address] URL: [email or web address] Date: 05/22/2020 Prepared by: Rosana Hoes  Exercises Supine Shoulder Flexion with Free Weight - 1 x daily - 7 x weekly - 2 sets - 10 reps Supine PNF D2 Flexion with Resistance - 1 x daily - 7 x weekly - 2 sets - 10 reps Shoulder Flexion Wall Slide with Towel - 1 x daily - 7 x weekly - 2 sets - 10 reps Standing Shoulder Internal Rotation Stretch with Hands Behind Back - 1 x daily - 7 x weekly - 2 sets - 10 reps - 5 hold Shoulder Abduction - Thumbs Up - 1 x daily - 7 x weekly - 2 sets - 10 reps Supine Shoulder Horizontal Abduction with Resistance - 1 x daily - 7 x weekly - 2 sets - 10 reps Shoulder External Rotation with Anchored Resistance - 1 x daily - 7 x weekly - 2 sets - 10 reps Shoulder Internal Rotation with Resistance - 1 x daily - 7 x weekly - 2 sets - 10 reps Standing Row with Anchored Resistance - 1 x daily - 7 x weekly - 2 sets - 10 reps

## 2020-05-28 ENCOUNTER — Other Ambulatory Visit: Payer: Self-pay

## 2020-05-28 ENCOUNTER — Ambulatory Visit: Payer: Medicare Other | Admitting: Physical Therapy

## 2020-05-28 ENCOUNTER — Encounter: Payer: Self-pay | Admitting: Physical Therapy

## 2020-05-28 DIAGNOSIS — M546 Pain in thoracic spine: Secondary | ICD-10-CM | POA: Diagnosis not present

## 2020-05-28 DIAGNOSIS — M25612 Stiffness of left shoulder, not elsewhere classified: Secondary | ICD-10-CM

## 2020-05-28 DIAGNOSIS — R293 Abnormal posture: Secondary | ICD-10-CM | POA: Diagnosis not present

## 2020-05-28 DIAGNOSIS — G8929 Other chronic pain: Secondary | ICD-10-CM | POA: Diagnosis not present

## 2020-05-28 DIAGNOSIS — M25512 Pain in left shoulder: Secondary | ICD-10-CM | POA: Diagnosis not present

## 2020-05-28 NOTE — Patient Instructions (Signed)
Access Code: [email or web address] URL: [email or web address] Date: 05/28/2020 Prepared by: Rosana Hoes  Exercises Supine Shoulder Flexion with Free Weight - 1 x daily - 7 x weekly - 2 sets - 10 reps Supine PNF D2 Flexion with Resistance - 1 x daily - 7 x weekly - 2 sets - 10 reps Supine Shoulder Horizontal Abduction with Resistance - 1 x daily - 7 x weekly - 2 sets - 10 reps Supine Bilateral Elbow Flexion Extension with Dumbbells - 1 x daily - 7 x weekly - 2 sets - 10 reps Shoulder Flexion Wall Slide with Towel - 1 x daily - 7 x weekly - 2 sets - 10 reps Standing Shoulder Internal Rotation Stretch with Hands Behind Back - 1 x daily - 7 x weekly - 2 sets - 10 reps - 5 hold Shoulder Abduction - Thumbs Up - 1 x daily - 7 x weekly - 2 sets - 10 reps Shoulder External Rotation with Anchored Resistance - 1 x daily - 7 x weekly - 2 sets - 10 reps Shoulder Internal Rotation with Resistance - 1 x daily - 7 x weekly - 2 sets - 10 reps Standing Row with Anchored Resistance - 1 x daily - 7 x weekly - 2 sets - 10 reps

## 2020-05-28 NOTE — Therapy (Signed)
[place], [place], [address] Phone: [phone]   Fax:  [phone]  Physical Therapy Treatment / Discharge  Patient Details  Name: Hannah Perez MRN: [id number] Date of Birth: [birth year] Referring Provider (PT): Cari Caraway, MD   Encounter Date: 05/28/2020   PT End of Session - 05/28/20 1201    Visit Number 15    Number of Visits 16    Date for PT Re-Evaluation 05/29/20    Authorization Type MCR/BCBS    Authorization Time Period KX by 15th    Progress Note Due on Visit 20    PT Start Time 1130    PT Stop Time 1210    PT Time Calculation (min) 40 min    Activity Tolerance Patient tolerated treatment well    Behavior During Therapy [facility] for tasks assessed/performed           Past Medical History:  Diagnosis Date  . Asthma    trigger from dog hair, dust , grass  . Benign essential tremor   . Folate deficiency 03/20/2018  . Hypercholesteremia   . Osteoporosis 2009   on forteo  . Vertigo   . Vitamin B12 deficiency 03/20/2018    Past Surgical History:  Procedure Laterality Date  . CATARACT EXTRACTION Left 04/2012  . KYPHOPLASTY    . KYPHOPLASTY N/A 02/15/2015   Procedure: Thoracic eight Kyphoplasty;  Surgeon: Kristeen Miss, MD;  Location: [place] [facility];  Service: Neurosurgery;  Laterality: N/A;  T8 Kyphoplasty  . TONSILLECTOMY    . TUBAL LIGATION      There were no vitals filed for this visit.   Subjective Assessment - 05/28/20 1136    Subjective Patient reports she is doing well with no new issues.    Patient Stated Goals 'not hurt', move it normally    Currently in Pain? 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    Referring Provider (PT) Cari Caraway, MD      Precautions   Precautions None      Restrictions   Weight Bearing  Restrictions No      Balance Screen   Has the patient fallen in the past 6 months No      Prior Function   Level of Independence Independent    Vocation Retired      Associate Professor   Overall Cognitive Status Within Functional Limits for tasks assessed      Observation/Other Assessments   Focus on Therapeutic Outcomes (FOTO)  64% functional status      AROM   Left Shoulder Flexion 125 Degrees    Left Shoulder ABduction 105 Degrees    Left Shoulder Internal Rotation --   T12     Strength   Left Shoulder Flexion 4/5    Left Shoulder Extension 4+/5    Left Shoulder ABduction 4/5    Left Shoulder Internal Rotation 5/5    Left Shoulder External Rotation 4+/5                         [facility] Adult PT Treatment/Exercise - 05/28/20 0001      Self-Care   Self-Care Other Self-Care Comments    Other Self-Care Comments  Exam findings, POC discharge, FOTO, follow-up instructions  Exercises   Exercises Shoulder      Shoulder Exercises: Supine   Horizontal ABduction 10 reps    Theraband Level (Shoulder Horizontal ABduction) Level 2 (Red)    Flexion 10 reps   3 sets   Theraband Level (Shoulder Flexion) Level 1 (Yellow)   2 sets   Shoulder Flexion Weight (lbs) 2   1 set   Other Supine Exercises Tricep extension with 2# 2 x 10      Shoulder Exercises: Standing   External Rotation 10 reps    Theraband Level (Shoulder External Rotation) Level 2 (Red)    Internal Rotation 10 reps    Theraband Level (Shoulder Internal Rotation) Level 2 (Red)    Flexion 10 reps    Flexion Limitations wall towel slide    ABduction 10 reps    ABduction Limitations AROM    Row 10 reps    Theraband Level (Shoulder Row) Level 2 (Red)      Shoulder Exercises: ROM/Strengthening   UBE (Upper Arm Bike) L2 x 4 min (2 fwd/bwd)      Shoulder Exercises: Stretch   Internal Rotation Stretch Limitations Functional IR behind back well arm assist x 10 with 5 sec hold                  PT  Education - 05/28/20 1201    Education Details POC discharge, HEP, FOTO    Person(s) Educated Patient    Methods Explanation    Comprehension Verbalized understanding            PT Short Term Goals - 03/27/20 1541      PT SHORT TERM GOAL #1   Title pt will be I with initial HEP    Baseline verbalizes being compliant with HEP since last visit aside from today due to having an appointment    Time 3    Period Weeks    Status Achieved    Target Date 03/28/20      PT SHORT TERM GOAL #2   Title PT will go over FOTO by 3rd visit.    Baseline reviewed previously (03/12/2020 per self care)    Time 3    Period Weeks    Status Achieved    Target Date 03/28/20             PT Long Term Goals - 05/28/20 1213      PT LONG TERM GOAL #1   Title Pt will increase FOTO from 46% to 65% to show functional improvement    Baseline 64% functional status    Time 4    Period Weeks    Status Partially Met      PT LONG TERM GOAL #2   Title Pt will improve L AROM abduction in supine to 105 to increase functional use of L arm    Baseline L abduction 105 AROM    Time 4    Period Weeks    Status Achieved      PT LONG TERM GOAL #3   Title Pt will improve L ER AROM to 35 degrees in order to increase functional use of L UE.    Baseline L ER 35 degrees    Status Achieved      PT LONG TERM GOAL #4   Title Patient will be able to scratch her back (functional IR) as desired and don/doff bra.    Baseline T12, patient able to perform tasks but does report continued tightness    Time 4  Period Weeks    Status Achieved      PT LONG TERM GOAL #5   Title Pt will demonstrate overal L shoulder strength of 4/5 in order to increase functional use of the L UE    Baseline left shoulder strength grossly >/= 4/5 MMT    Time 4    Period Weeks    Status Achieved                 Plan - 05/28/20 1202    Clinical Impression Statement Patient tolerated therapy well with no adverse effects. Patient  has made great progress toward LTGs, achieving all but FOTO goal which she is shy by 1%. She is pleased with her functional level and is ready to discharge to independent HEP. She demonstrates HEP appropriately and reports no increased pain, still has most trouble with functional reach behind back and abduction AROM. PT is no longer indicated and patient will be formally discharged from therapy.    PT Treatment/Interventions ADLs/Self Care Home Management;Moist Heat;Electrical Stimulation;Functional mobility training;Therapeutic activities;Therapeutic exercise;Patient/family education;Passive range of motion;Manual techniques;Iontophoresis 38m/ml Dexamethasone;Cryotherapy;Balance training    PT Next Visit Plan NA - discharge    PT Home Exercise Plan [email or web address]    Consulted and Agree with Plan of Care Patient           Patient will benefit from skilled therapeutic intervention in order to improve the following deficits and impairments:  Decreased mobility,Decreased strength,Postural dysfunction,Decreased range of motion,Pain,Decreased activity tolerance,Hypomobility  Visit Diagnosis: Chronic left shoulder pain  Stiffness of left shoulder, not elsewhere classified  Abnormal posture  Pain in thoracic spine     Problem List Patient Active Problem List   Diagnosis Date Noted  . E. coli UTI 03/20/2018  . Hypotension 03/20/2018  . Dehydration 03/20/2018  . Hypokalemia 03/20/2018  . Vitamin B12 deficiency 03/20/2018  . Folate deficiency 03/20/2018  . Anemia 03/20/2018  . Hyperlipidemia 03/19/2018  . Acute lower UTI 03/19/2018  . Acute metabolic encephalopathy 055/20/8022 . Encephalopathy acute 03/19/2018  . T8 vertebral fracture, with delayed healing, subsequent encounter 02/15/2015  . Osteoporosis   . Benign essential tremor     CHilda Blades PT, DPT, LAT, ATC 05/28/20  12:39 PM Phone: [phone]: 3[facility]GAver[address] [place] [address]Phone: [phone]  Fax:  [phone] Name: LJAYCEY GENSMRN: [id number] of Birth: 5July 05, 1952  PHYSICAL THERAPY DISCHARGE SUMMARY  Visits from Start of Care: 15  Current functional level related to goals / functional outcomes: See above   Remaining deficits: See above   Education / Equipment: HEP Plan: Patient agrees to discharge.  Patient goals were partially met. Patient is being discharged due to being pleased with the current functional level.  ?????

## 2020-05-29 ENCOUNTER — Ambulatory Visit: Payer: Medicare Other | Admitting: Physical Therapy

## 2020-07-31 IMAGING — MG DIGITAL SCREENING BILAT W/ TOMO W/ CAD
8 series · 8 of 24 positions shown · non-contrast
Comparison: Previous exam(s).

CLINICAL DATA: Screening.

EXAM:
DIGITAL SCREENING BILATERAL MAMMOGRAM WITH TOMO AND CAD

[R MLO synth-2D]
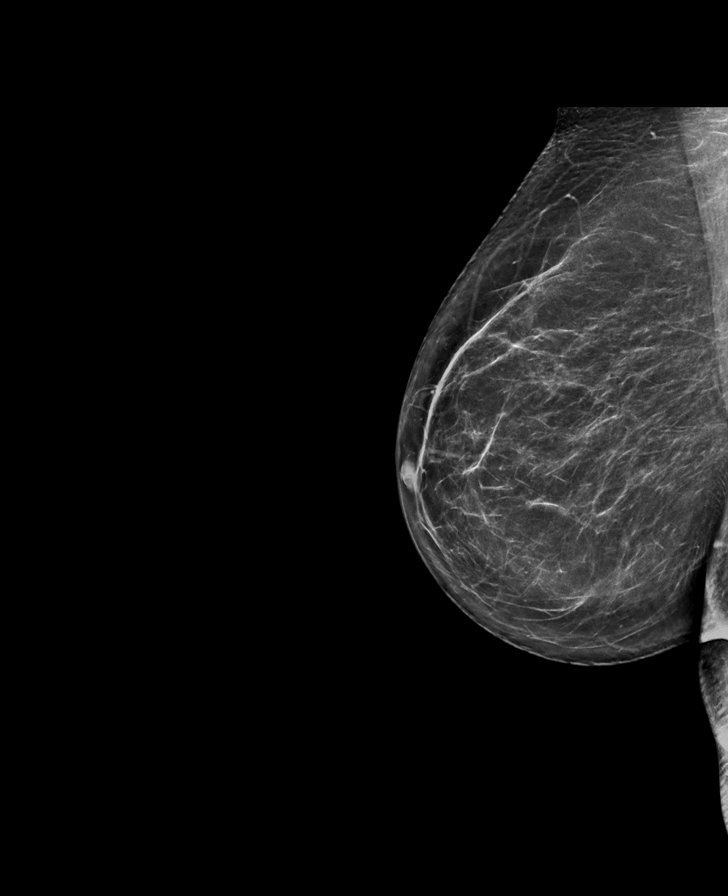

[L CC synth-2D]
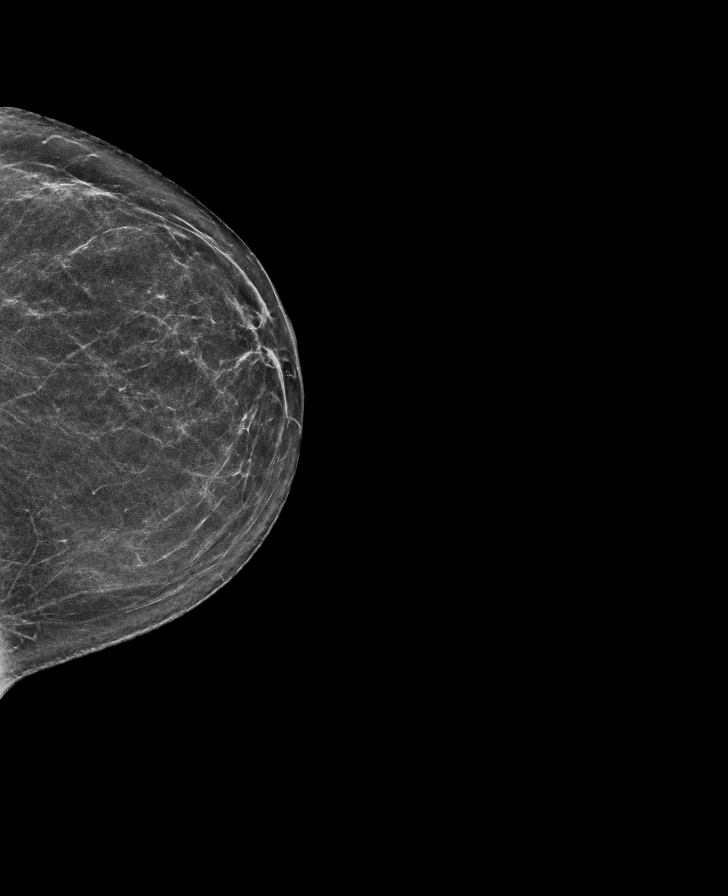

[R CC synth-2D]
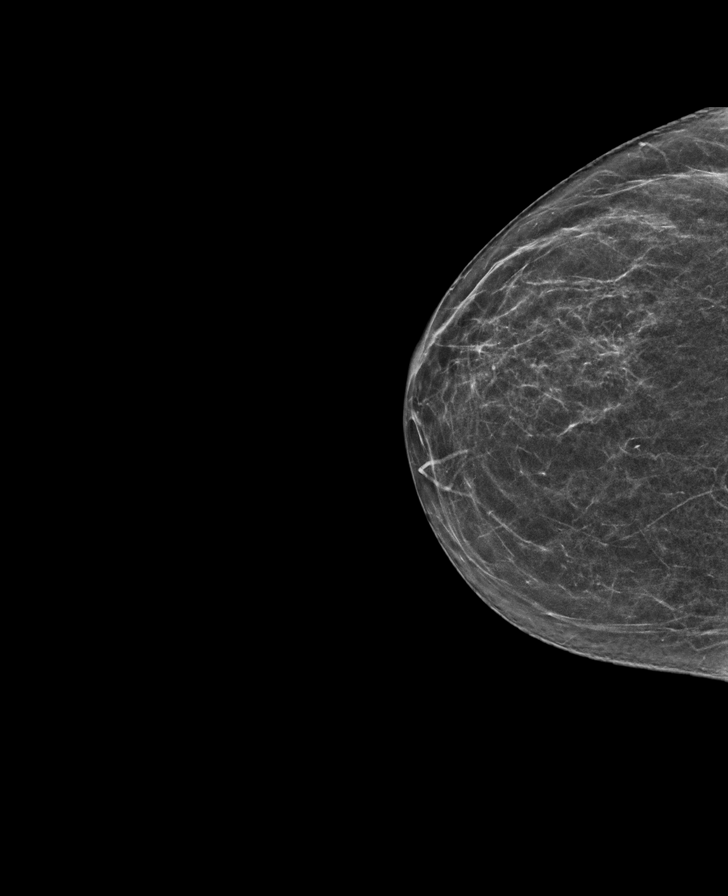

[L MLO synth-2D]
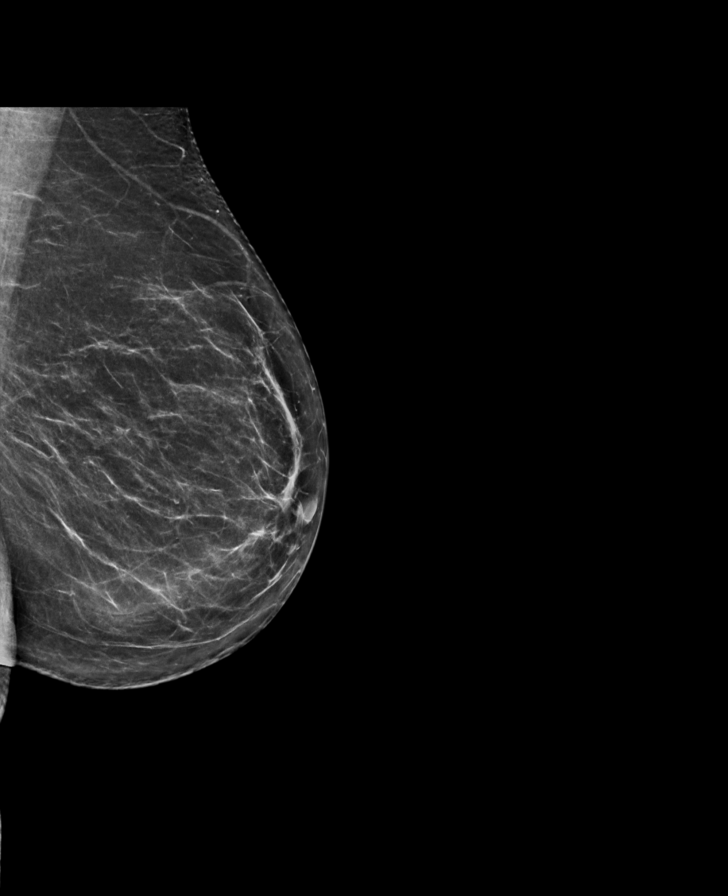

[R CC tomo · tomo slice 30/59.0]
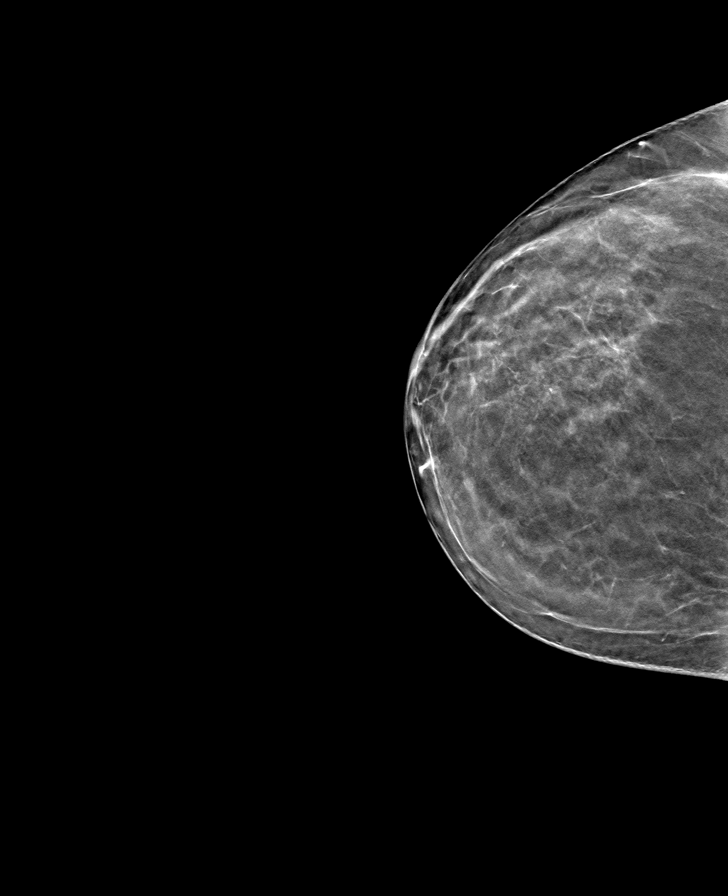

[L CC tomo · tomo slice 35/68.0]
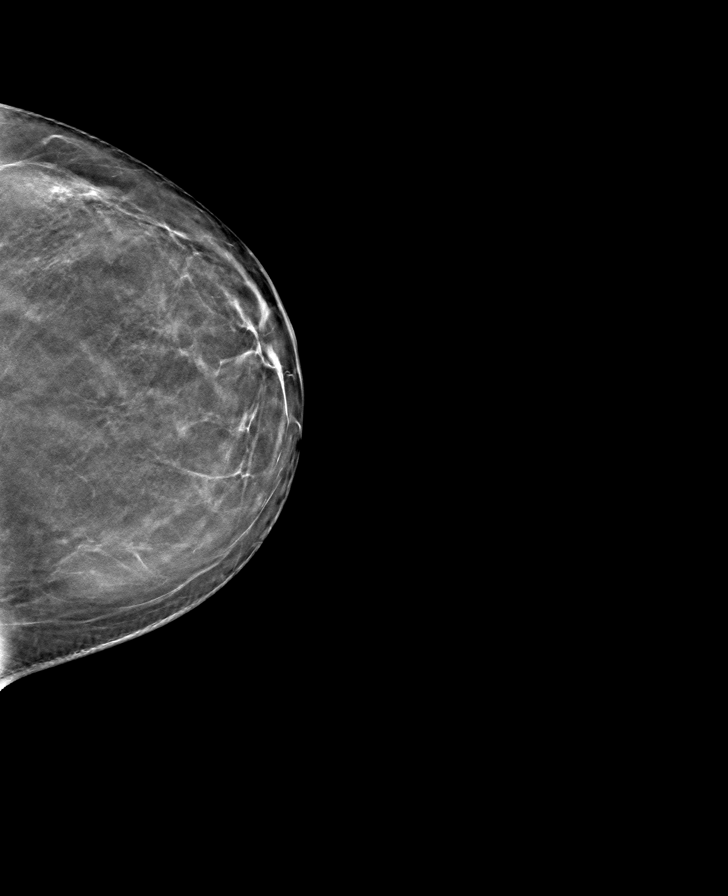

[L MLO tomo · tomo slice 37/72.0]
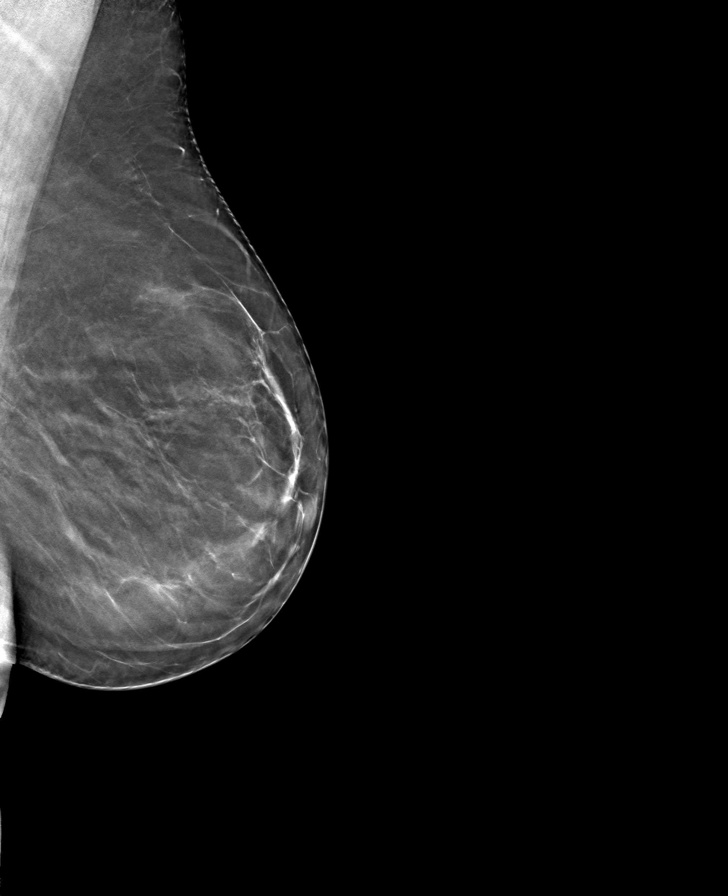

[R MLO tomo · tomo slice 35/70.0]
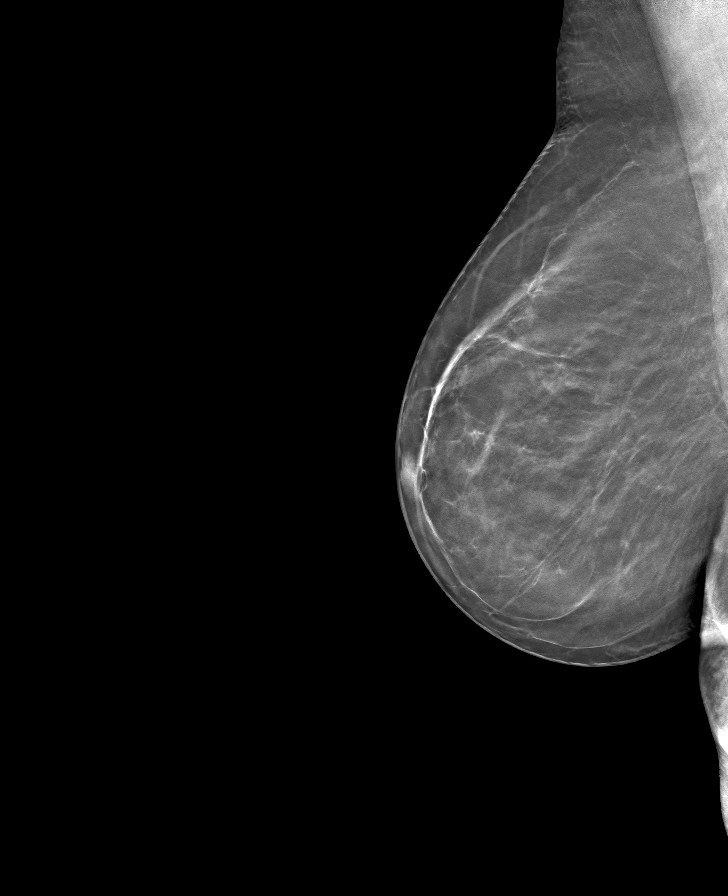

[8 of 24 positions shown; findings below may reference images not displayed]

ACR Breast Density Category b: There are scattered areas of
fibroglandular density.
FINDINGS: There are no findings suspicious for malignancy. Images were
processed with CAD.
IMPRESSION: No mammographic evidence of malignancy. A result letter of this
screening mammogram will be mailed directly to the patient.

RECOMMENDATION:
Screening mammogram in one year. (Code:[id number])

BI-RADS CATEGORY  1: Negative.

## 2020-08-20 DIAGNOSIS — M810 Age-related osteoporosis without current pathological fracture: Secondary | ICD-10-CM | POA: Diagnosis not present

## 2020-08-20 DIAGNOSIS — Z1389 Encounter for screening for other disorder: Secondary | ICD-10-CM | POA: Diagnosis not present

## 2020-08-20 DIAGNOSIS — R2689 Other abnormalities of gait and mobility: Secondary | ICD-10-CM | POA: Diagnosis not present

## 2020-08-20 DIAGNOSIS — Z79899 Other long term (current) drug therapy: Secondary | ICD-10-CM | POA: Diagnosis not present

## 2020-08-20 DIAGNOSIS — Z0000 Encounter for general adult medical examination without abnormal findings: Secondary | ICD-10-CM | POA: Diagnosis not present

## 2020-08-20 DIAGNOSIS — R251 Tremor, unspecified: Secondary | ICD-10-CM | POA: Diagnosis not present

## 2020-08-20 DIAGNOSIS — R7989 Other specified abnormal findings of blood chemistry: Secondary | ICD-10-CM | POA: Diagnosis not present

## 2020-08-20 DIAGNOSIS — M7502 Adhesive capsulitis of left shoulder: Secondary | ICD-10-CM | POA: Diagnosis not present

## 2020-08-20 DIAGNOSIS — E785 Hyperlipidemia, unspecified: Secondary | ICD-10-CM | POA: Diagnosis not present

## 2020-08-23 DIAGNOSIS — R251 Tremor, unspecified: Secondary | ICD-10-CM | POA: Diagnosis not present

## 2020-08-23 DIAGNOSIS — Z23 Encounter for immunization: Secondary | ICD-10-CM | POA: Diagnosis not present

## 2020-08-23 DIAGNOSIS — Z79899 Other long term (current) drug therapy: Secondary | ICD-10-CM | POA: Diagnosis not present

## 2020-08-23 DIAGNOSIS — E785 Hyperlipidemia, unspecified: Secondary | ICD-10-CM | POA: Diagnosis not present

## 2020-08-23 DIAGNOSIS — M810 Age-related osteoporosis without current pathological fracture: Secondary | ICD-10-CM | POA: Diagnosis not present

## 2020-08-23 DIAGNOSIS — E038 Other specified hypothyroidism: Secondary | ICD-10-CM | POA: Diagnosis not present

## 2020-08-29 DIAGNOSIS — Z8781 Personal history of (healed) traumatic fracture: Secondary | ICD-10-CM | POA: Diagnosis not present

## 2020-08-29 DIAGNOSIS — M810 Age-related osteoporosis without current pathological fracture: Secondary | ICD-10-CM | POA: Diagnosis not present

## 2020-08-29 DIAGNOSIS — Z87891 Personal history of nicotine dependence: Secondary | ICD-10-CM | POA: Diagnosis not present

## 2020-08-29 DIAGNOSIS — Z8262 Family history of osteoporosis: Secondary | ICD-10-CM | POA: Diagnosis not present

## 2020-09-06 DIAGNOSIS — H04123 Dry eye syndrome of bilateral lacrimal glands: Secondary | ICD-10-CM | POA: Diagnosis not present

## 2020-09-06 DIAGNOSIS — H43813 Vitreous degeneration, bilateral: Secondary | ICD-10-CM | POA: Diagnosis not present

## 2020-09-06 DIAGNOSIS — H0100A Unspecified blepharitis right eye, upper and lower eyelids: Secondary | ICD-10-CM | POA: Diagnosis not present

## 2020-09-06 DIAGNOSIS — H524 Presbyopia: Secondary | ICD-10-CM | POA: Diagnosis not present

## 2020-09-20 DIAGNOSIS — Z23 Encounter for immunization: Secondary | ICD-10-CM | POA: Diagnosis not present

## 2020-10-08 DIAGNOSIS — Z23 Encounter for immunization: Secondary | ICD-10-CM | POA: Diagnosis not present

## 2021-02-20 DIAGNOSIS — E038 Other specified hypothyroidism: Secondary | ICD-10-CM | POA: Diagnosis not present

## 2021-02-20 DIAGNOSIS — Z79899 Other long term (current) drug therapy: Secondary | ICD-10-CM | POA: Diagnosis not present

## 2021-02-20 DIAGNOSIS — R946 Abnormal results of thyroid function studies: Secondary | ICD-10-CM | POA: Diagnosis not present

## 2021-02-25 ENCOUNTER — Other Ambulatory Visit: Payer: Self-pay | Admitting: Family Medicine

## 2021-02-25 DIAGNOSIS — Z1389 Encounter for screening for other disorder: Secondary | ICD-10-CM | POA: Diagnosis not present

## 2021-02-25 DIAGNOSIS — M810 Age-related osteoporosis without current pathological fracture: Secondary | ICD-10-CM

## 2021-02-25 DIAGNOSIS — E038 Other specified hypothyroidism: Secondary | ICD-10-CM | POA: Diagnosis not present

## 2021-02-25 DIAGNOSIS — Z1211 Encounter for screening for malignant neoplasm of colon: Secondary | ICD-10-CM | POA: Diagnosis not present

## 2021-02-25 DIAGNOSIS — R251 Tremor, unspecified: Secondary | ICD-10-CM | POA: Diagnosis not present

## 2021-02-25 DIAGNOSIS — Z0000 Encounter for general adult medical examination without abnormal findings: Secondary | ICD-10-CM | POA: Diagnosis not present

## 2021-02-25 DIAGNOSIS — E785 Hyperlipidemia, unspecified: Secondary | ICD-10-CM | POA: Diagnosis not present

## 2021-02-25 DIAGNOSIS — Z1231 Encounter for screening mammogram for malignant neoplasm of breast: Secondary | ICD-10-CM

## 2021-08-21 DIAGNOSIS — E785 Hyperlipidemia, unspecified: Secondary | ICD-10-CM | POA: Diagnosis not present

## 2021-08-21 DIAGNOSIS — M810 Age-related osteoporosis without current pathological fracture: Secondary | ICD-10-CM | POA: Diagnosis not present

## 2021-08-21 DIAGNOSIS — Z79899 Other long term (current) drug therapy: Secondary | ICD-10-CM | POA: Diagnosis not present

## 2021-08-21 DIAGNOSIS — E038 Other specified hypothyroidism: Secondary | ICD-10-CM | POA: Diagnosis not present

## 2021-08-26 DIAGNOSIS — M542 Cervicalgia: Secondary | ICD-10-CM | POA: Diagnosis not present

## 2021-08-26 DIAGNOSIS — M810 Age-related osteoporosis without current pathological fracture: Secondary | ICD-10-CM | POA: Diagnosis not present

## 2021-08-26 DIAGNOSIS — R251 Tremor, unspecified: Secondary | ICD-10-CM | POA: Diagnosis not present

## 2021-08-26 DIAGNOSIS — E538 Deficiency of other specified B group vitamins: Secondary | ICD-10-CM | POA: Diagnosis not present

## 2021-08-26 DIAGNOSIS — Z79899 Other long term (current) drug therapy: Secondary | ICD-10-CM | POA: Diagnosis not present

## 2021-08-26 DIAGNOSIS — E038 Other specified hypothyroidism: Secondary | ICD-10-CM | POA: Diagnosis not present

## 2021-08-26 DIAGNOSIS — D72829 Elevated white blood cell count, unspecified: Secondary | ICD-10-CM | POA: Diagnosis not present

## 2021-08-26 DIAGNOSIS — Z23 Encounter for immunization: Secondary | ICD-10-CM | POA: Diagnosis not present

## 2021-08-26 DIAGNOSIS — E785 Hyperlipidemia, unspecified: Secondary | ICD-10-CM | POA: Diagnosis not present

## 2021-09-09 DIAGNOSIS — H43813 Vitreous degeneration, bilateral: Secondary | ICD-10-CM | POA: Diagnosis not present

## 2021-09-09 DIAGNOSIS — T1511XA Foreign body in conjunctival sac, right eye, initial encounter: Secondary | ICD-10-CM | POA: Diagnosis not present

## 2021-09-09 DIAGNOSIS — H524 Presbyopia: Secondary | ICD-10-CM | POA: Diagnosis not present

## 2021-09-09 DIAGNOSIS — H52203 Unspecified astigmatism, bilateral: Secondary | ICD-10-CM | POA: Diagnosis not present

## 2021-09-09 DIAGNOSIS — H26492 Other secondary cataract, left eye: Secondary | ICD-10-CM | POA: Diagnosis not present

## 2021-09-22 ENCOUNTER — Ambulatory Visit
Admission: RE | Admit: 2021-09-22 | Discharge: 2021-09-22 | Disposition: A | Discharge status: Home / Self Care | Payer: Medicare Other | Source: Ambulatory Visit | Attending: Family Medicine | Admitting: Family Medicine

## 2021-09-22 DIAGNOSIS — M810 Age-related osteoporosis without current pathological fracture: Secondary | ICD-10-CM | POA: Diagnosis not present

## 2021-09-22 DIAGNOSIS — Z1231 Encounter for screening mammogram for malignant neoplasm of breast: Secondary | ICD-10-CM | POA: Diagnosis not present

## 2021-09-22 DIAGNOSIS — Z780 Asymptomatic menopausal state: Secondary | ICD-10-CM | POA: Diagnosis not present

## 2021-11-06 DIAGNOSIS — Z23 Encounter for immunization: Secondary | ICD-10-CM | POA: Diagnosis not present

## 2022-03-03 DIAGNOSIS — M810 Age-related osteoporosis without current pathological fracture: Secondary | ICD-10-CM | POA: Diagnosis not present

## 2022-03-03 DIAGNOSIS — E038 Other specified hypothyroidism: Secondary | ICD-10-CM | POA: Diagnosis not present

## 2022-03-03 DIAGNOSIS — Z0000 Encounter for general adult medical examination without abnormal findings: Secondary | ICD-10-CM | POA: Diagnosis not present

## 2022-03-03 DIAGNOSIS — Z1389 Encounter for screening for other disorder: Secondary | ICD-10-CM | POA: Diagnosis not present

## 2022-03-03 DIAGNOSIS — Z79899 Other long term (current) drug therapy: Secondary | ICD-10-CM | POA: Diagnosis not present

## 2022-03-09 DIAGNOSIS — E538 Deficiency of other specified B group vitamins: Secondary | ICD-10-CM | POA: Diagnosis not present

## 2022-03-09 DIAGNOSIS — Z23 Encounter for immunization: Secondary | ICD-10-CM | POA: Diagnosis not present

## 2022-03-09 DIAGNOSIS — F439 Reaction to severe stress, unspecified: Secondary | ICD-10-CM | POA: Diagnosis not present

## 2022-03-09 DIAGNOSIS — E785 Hyperlipidemia, unspecified: Secondary | ICD-10-CM | POA: Diagnosis not present

## 2022-03-09 DIAGNOSIS — M810 Age-related osteoporosis without current pathological fracture: Secondary | ICD-10-CM | POA: Diagnosis not present

## 2022-03-09 DIAGNOSIS — Z79899 Other long term (current) drug therapy: Secondary | ICD-10-CM | POA: Diagnosis not present

## 2022-03-09 DIAGNOSIS — E038 Other specified hypothyroidism: Secondary | ICD-10-CM | POA: Diagnosis not present

## 2022-03-09 DIAGNOSIS — Z6829 Body mass index (BMI) 29.0-29.9, adult: Secondary | ICD-10-CM | POA: Diagnosis not present

## 2022-03-09 DIAGNOSIS — R251 Tremor, unspecified: Secondary | ICD-10-CM | POA: Diagnosis not present

## 2022-04-21 ENCOUNTER — Emergency Department (HOSPITAL_BASED_OUTPATIENT_CLINIC_OR_DEPARTMENT_OTHER): Payer: Medicare Other

## 2022-04-21 ENCOUNTER — Other Ambulatory Visit: Payer: Self-pay

## 2022-04-21 ENCOUNTER — Emergency Department (HOSPITAL_BASED_OUTPATIENT_CLINIC_OR_DEPARTMENT_OTHER): Payer: Medicare Other | Admitting: Radiology

## 2022-04-21 ENCOUNTER — Encounter (HOSPITAL_BASED_OUTPATIENT_CLINIC_OR_DEPARTMENT_OTHER): Payer: Self-pay

## 2022-04-21 ENCOUNTER — Inpatient Hospital Stay (HOSPITAL_BASED_OUTPATIENT_CLINIC_OR_DEPARTMENT_OTHER)
Admission: EM | Admit: 2022-04-21 | Discharge: 2022-04-23 | DRG: 552 | Disposition: A | Discharge status: Home / Self Care | Payer: Medicare Other | Attending: Internal Medicine | Admitting: Internal Medicine

## 2022-04-21 DIAGNOSIS — G250 Essential tremor: Secondary | ICD-10-CM | POA: Diagnosis present

## 2022-04-21 DIAGNOSIS — E669 Obesity, unspecified: Secondary | ICD-10-CM | POA: Diagnosis present

## 2022-04-21 DIAGNOSIS — D72829 Elevated white blood cell count, unspecified: Secondary | ICD-10-CM | POA: Diagnosis present

## 2022-04-21 DIAGNOSIS — E538 Deficiency of other specified B group vitamins: Secondary | ICD-10-CM | POA: Diagnosis present

## 2022-04-21 DIAGNOSIS — E785 Hyperlipidemia, unspecified: Secondary | ICD-10-CM | POA: Diagnosis present

## 2022-04-21 DIAGNOSIS — M810 Age-related osteoporosis without current pathological fracture: Secondary | ICD-10-CM | POA: Diagnosis present

## 2022-04-21 DIAGNOSIS — J90 Pleural effusion, not elsewhere classified: Secondary | ICD-10-CM | POA: Diagnosis not present

## 2022-04-21 DIAGNOSIS — R000 Tachycardia, unspecified: Secondary | ICD-10-CM | POA: Diagnosis present

## 2022-04-21 DIAGNOSIS — M546 Pain in thoracic spine: Secondary | ICD-10-CM | POA: Diagnosis not present

## 2022-04-21 DIAGNOSIS — Z79899 Other long term (current) drug therapy: Secondary | ICD-10-CM

## 2022-04-21 DIAGNOSIS — M542 Cervicalgia: Secondary | ICD-10-CM | POA: Diagnosis not present

## 2022-04-21 DIAGNOSIS — M4316 Spondylolisthesis, lumbar region: Secondary | ICD-10-CM | POA: Diagnosis not present

## 2022-04-21 DIAGNOSIS — E7800 Pure hypercholesterolemia, unspecified: Secondary | ICD-10-CM | POA: Diagnosis not present

## 2022-04-21 DIAGNOSIS — Z8262 Family history of osteoporosis: Secondary | ICD-10-CM

## 2022-04-21 DIAGNOSIS — Z87311 Personal history of (healed) other pathological fracture: Secondary | ICD-10-CM

## 2022-04-21 DIAGNOSIS — M549 Dorsalgia, unspecified: Secondary | ICD-10-CM | POA: Diagnosis not present

## 2022-04-21 DIAGNOSIS — E782 Mixed hyperlipidemia: Secondary | ICD-10-CM

## 2022-04-21 DIAGNOSIS — G8929 Other chronic pain: Secondary | ICD-10-CM | POA: Diagnosis present

## 2022-04-21 DIAGNOSIS — R079 Chest pain, unspecified: Secondary | ICD-10-CM | POA: Diagnosis not present

## 2022-04-21 DIAGNOSIS — J4520 Mild intermittent asthma, uncomplicated: Secondary | ICD-10-CM | POA: Diagnosis present

## 2022-04-21 DIAGNOSIS — Z6828 Body mass index (BMI) 28.0-28.9, adult: Secondary | ICD-10-CM

## 2022-04-21 DIAGNOSIS — B349 Viral infection, unspecified: Secondary | ICD-10-CM | POA: Diagnosis present

## 2022-04-21 DIAGNOSIS — M7989 Other specified soft tissue disorders: Secondary | ICD-10-CM | POA: Diagnosis not present

## 2022-04-21 DIAGNOSIS — R6510 Systemic inflammatory response syndrome (SIRS) of non-infectious origin without acute organ dysfunction: Secondary | ICD-10-CM | POA: Diagnosis present

## 2022-04-21 DIAGNOSIS — M40204 Unspecified kyphosis, thoracic region: Secondary | ICD-10-CM | POA: Diagnosis not present

## 2022-04-21 DIAGNOSIS — Z880 Allergy status to penicillin: Secondary | ICD-10-CM

## 2022-04-21 DIAGNOSIS — I3139 Other pericardial effusion (noninflammatory): Secondary | ICD-10-CM | POA: Diagnosis not present

## 2022-04-21 DIAGNOSIS — E860 Dehydration: Secondary | ICD-10-CM | POA: Diagnosis not present

## 2022-04-21 DIAGNOSIS — R091 Pleurisy: Secondary | ICD-10-CM | POA: Diagnosis not present

## 2022-04-21 LAB — URINALYSIS, ROUTINE W REFLEX MICROSCOPIC
Bilirubin Urine: NEGATIVE
Glucose, UA: NEGATIVE mg/dL
Hgb urine dipstick: NEGATIVE
Ketones, ur: NEGATIVE mg/dL
Leukocytes,Ua: NEGATIVE
Nitrite: NEGATIVE
Specific Gravity, Urine: >1.046 — ABNORMAL HIGH (ref 1.005–1.030)
pH: 6 (ref 5.0–8.0)

## 2022-04-21 LAB — CBC WITH DIFFERENTIAL/PLATELET
Abs Immature Granulocytes: 0.08 10*3/uL — ABNORMAL HIGH (ref 0.00–0.07)
Basophils Absolute: 0 10*3/uL (ref 0.0–0.1)
Basophils Relative: 0 %
Eosinophils Absolute: 0.2 10*3/uL (ref 0.0–0.5)
Eosinophils Relative: 1 %
HCT: 39.7 % (ref 36.0–46.0)
Hemoglobin: 13.1 g/dL (ref 12.0–15.0)
Immature Granulocytes: 0 %
Lymphocytes Relative: 13 %
Lymphs Abs: 2.4 10*3/uL (ref 0.7–4.0)
MCH: 30.5 pg (ref 26.0–34.0)
MCHC: 33 g/dL (ref 30.0–36.0)
MCV: 92.3 fL (ref 80.0–100.0)
Monocytes Absolute: 1.5 10*3/uL — ABNORMAL HIGH (ref 0.1–1.0)
Monocytes Relative: 8 %
Neutro Abs: 14.6 10*3/uL — ABNORMAL HIGH (ref 1.7–7.7)
Neutrophils Relative %: 78 %
Platelets: 370 10*3/uL (ref 150–400)
RBC: 4.3 MIL/uL (ref 3.87–5.11)
RDW: 13.9 % (ref 11.5–15.5)
WBC: 18.8 10*3/uL — ABNORMAL HIGH (ref 4.0–10.5)
nRBC: 0 % (ref 0.0–0.2)

## 2022-04-21 LAB — SEDIMENTATION RATE: Sed Rate: 19 mm/hr (ref 0–22)

## 2022-04-21 LAB — BASIC METABOLIC PANEL
Anion gap: 11 (ref 5–15)
BUN: 10 mg/dL (ref 8–23)
CO2: 23 mmol/L (ref 22–32)
Calcium: 10.1 mg/dL (ref 8.9–10.3)
Chloride: 102 mmol/L (ref 98–111)
Creatinine, Ser: 0.69 mg/dL (ref 0.44–1.00)
GFR, Estimated: >60 mL/min (ref >60)
Glucose, Bld: 129 mg/dL — ABNORMAL HIGH (ref 70–99)
Potassium: 4.1 mmol/L (ref 3.5–5.1)
Sodium: 136 mmol/L (ref 135–145)

## 2022-04-21 LAB — TROPONIN I (HIGH SENSITIVITY)
Troponin I (High Sensitivity): <2 ng/L (ref <18)
Troponin I (High Sensitivity): <2 ng/L (ref <18)

## 2022-04-21 LAB — LACTIC ACID, PLASMA: Lactic Acid, Venous: 1 mmol/L (ref 0.5–1.9)

## 2022-04-21 LAB — CULTURE, BLOOD (ROUTINE X 2)

## 2022-04-21 MED ORDER — MORPHINE SULFATE (PF) 4 MG/ML IV SOLN
6.0000 mg | Freq: Once | INTRAVENOUS | Status: AC
  Administered 2022-04-21: 6 mg via INTRAVENOUS
  Filled 2022-04-21: qty 2

## 2022-04-21 MED ORDER — ONDANSETRON HCL 4 MG/2ML IJ SOLN: 4.0000 mg | Freq: Four times a day (QID) | INTRAMUSCULAR | Status: DC | PRN

## 2022-04-21 MED ORDER — TRAMADOL HCL 50 MG PO TABS
50.0000 mg | ORAL_TABLET | Freq: Once | ORAL | Status: AC
  Administered 2022-04-21: 50 mg via ORAL
  Filled 2022-04-21: qty 1

## 2022-04-21 MED ORDER — ALBUTEROL SULFATE (2.5 MG/3ML) 0.083% IN NEBU: 2.5000 mg | INHALATION_SOLUTION | RESPIRATORY_TRACT | Status: DC | PRN

## 2022-04-21 MED ORDER — PRIMIDONE 250 MG PO TABS
250.0000 mg | ORAL_TABLET | Freq: Two times a day (BID) | ORAL | Status: DC
  Administered 2022-04-22 – 2022-04-23 (×4): 250 mg via ORAL
  Filled 2022-04-21 (×5): qty 1

## 2022-04-21 MED ORDER — ROSUVASTATIN CALCIUM 20 MG PO TABS
20.0000 mg | ORAL_TABLET | Freq: Every day | ORAL | Status: DC
  Administered 2022-04-21 – 2022-04-22 (×2): 20 mg via ORAL
  Filled 2022-04-21 (×2): qty 1

## 2022-04-21 MED ORDER — SODIUM CHLORIDE 0.9 % IV BOLUS
1000.0000 mL | Freq: Once | INTRAVENOUS | Status: AC
  Administered 2022-04-21: 1000 mL via INTRAVENOUS

## 2022-04-21 MED ORDER — ACETAMINOPHEN 650 MG RE SUPP: 650.0000 mg | Freq: Four times a day (QID) | RECTAL | Status: DC | PRN

## 2022-04-21 MED ORDER — TRAMADOL HCL 50 MG PO TABS
50.0000 mg | ORAL_TABLET | Freq: Once | ORAL | Status: AC | PRN
  Administered 2022-04-22: 50 mg via ORAL
  Filled 2022-04-21: qty 1

## 2022-04-21 MED ORDER — SODIUM CHLORIDE 0.9 % IV SOLN
2.0000 g | Freq: Once | INTRAVENOUS | Status: AC
  Administered 2022-04-21: 2 g via INTRAVENOUS
  Filled 2022-04-21: qty 20

## 2022-04-21 MED ORDER — KETOROLAC TROMETHAMINE 15 MG/ML IJ SOLN
15.0000 mg | Freq: Once | INTRAMUSCULAR | Status: AC
  Administered 2022-04-21: 15 mg via INTRAVENOUS
  Filled 2022-04-21: qty 1

## 2022-04-21 MED ORDER — FENOFIBRATE 54 MG PO TABS
54.0000 mg | ORAL_TABLET | Freq: Every day | ORAL | Status: DC
  Administered 2022-04-21 – 2022-04-22 (×2): 54 mg via ORAL
  Filled 2022-04-21 (×3): qty 1

## 2022-04-21 MED ORDER — ACETAMINOPHEN 325 MG PO TABS: 650.0000 mg | ORAL_TABLET | Freq: Four times a day (QID) | ORAL | Status: DC | PRN

## 2022-04-21 MED ORDER — OYSTER SHELL CALCIUM/D3 500-5 MG-MCG PO TABS
1.0000 | ORAL_TABLET | Freq: Every day | ORAL | Status: DC
  Administered 2022-04-22 – 2022-04-23 (×2): 1 via ORAL
  Filled 2022-04-21 (×2): qty 1

## 2022-04-21 MED ORDER — TIZANIDINE HCL 2 MG PO TABS
2.0000 mg | ORAL_TABLET | Freq: Once | ORAL | Status: AC
  Administered 2022-04-21: 2 mg via ORAL
  Filled 2022-04-21: qty 1

## 2022-04-21 MED ORDER — METOPROLOL TARTRATE 5 MG/5ML IV SOLN
2.5000 mg | INTRAVENOUS | Status: DC | PRN
  Filled 2022-04-21: qty 5

## 2022-04-21 MED ORDER — LACTATED RINGERS IV SOLN: INTRAVENOUS | Status: AC

## 2022-04-21 MED ORDER — FENTANYL CITRATE PF 50 MCG/ML IJ SOSY
50.0000 ug | PREFILLED_SYRINGE | Freq: Once | INTRAMUSCULAR | Status: AC
  Administered 2022-04-21: 50 ug via INTRAVENOUS
  Filled 2022-04-21: qty 1

## 2022-04-21 MED ORDER — MELATONIN 3 MG PO TABS
3.0000 mg | ORAL_TABLET | Freq: Every evening | ORAL | Status: DC | PRN
  Filled 2022-04-21: qty 1

## 2022-04-21 MED ORDER — IOHEXOL 350 MG/ML SOLN
100.0000 mL | Freq: Once | INTRAVENOUS | Status: AC | PRN
  Administered 2022-04-21: 60 mL via INTRAVENOUS

## 2022-04-21 MED ORDER — KETOROLAC TROMETHAMINE 15 MG/ML IJ SOLN: 15.0000 mg | Freq: Four times a day (QID) | INTRAMUSCULAR | Status: DC | PRN

## 2022-04-21 NOTE — ED Notes (Signed)
Patient transported to [facility] via [facility] at this time.  All personal belongings with patient

## 2022-04-21 NOTE — Plan of Care (Signed)
Transfer from [facility] Hannah Perez is a 72 year old female pmh with hyperlipidemia, essential tremor, and osteoporosis who presented with complaints of 2-day history of intractable thoracic back pain.  Prior history of compression fracture s/p kyphoplasty.  Labs significant for WBC 18.8 and troponins negative x 2.  Vital signs significant for temperature of 99 F with tachycardia, tachypnea, and initial blood pressures as low as 87/41.  Patient was noted to meet SIRS criteria.  X-rays did not note any new compression fracture.  CTA of the chest did not show any signs of a pulmonary embolism.  Lactic acid, blood cultures, and urinalysis orders just placed.  Patient has been given 1 L of lactated Ringer's and medications for pain.  Patient also reported to have recently lost her husband.  SIRS of unclear source.  Blood pressures are currently maintained, but patient just received IV fluids and therefore excepted to a progressive bed.

## 2022-04-21 NOTE — H&P (Signed)
History and Physical      [name]:[id number] DOB: 1950/03/14 DOA: 04/21/2022  PCP: Gweneth Dimitri, MD  Patient coming from: home   I have personally briefly reviewed patient's old medical records in [facility]  Chief Complaint: Thoracic back discomfort  HPI: Hannah Perez is a 72 y.o. 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   The patient reports 2 days of persistent sharp, nonradiating thoracic back discomfort, which she notes to be midline in the mid thoracic spine.  She notes worsening of this discomfort with flexion of the thoracic spine.  Denies any recent preceding injury.  No associated any acute focal weakness, nor any acute focal numbness or paresthesias.  She has a documented history of multiple thoracic compression fractures, including a documented T8 compression fracture status post kyphoplasty in February 2017.  She also has a documented history of osteoporosis for which she is on ibandronate as an outpatient.  She reports that her 2 days of thoracic back discomfort not associate with any chest pain, shortness of breath, cough, mopped assist, palpitations, diaphoresis, nausea, vomiting, dizziness, presyncope, or syncope.  Denies any associated headache, neck stiffness, rash, subjective fever, chills, rigors, generalized myalgias.  She also denies any recent abdominal discomfort, diarrhea, dysuria, or gross hematuria.  She conveys that she recently lost her husband and notes that she has been eating less food and consuming less water since his passing.  She also conveys a relative decline in ambulatory status  since his passing.     [facility] ED Course:  Vital signs in the ED were notable for the following: Dr. Annell Greening to; heart rates in the 90s to 120s; initial blood pressure noted to be 87/41, subsequently increasing to 133/67 following interval IV fluid bolus, as per the hospitalist on-call respiratory rate 19-24, oxygen saturation 94 to 100% on room air.  Labs were notable for the following: BMP notable for sodium 136 Naziah Weckerly to 23, creatinine 0.69 compared to most recent prior value 0.73 in March 2020, glucose 129.  High-sensitivity troponin I x 2 values were both found to be less than 2.  CBC notable for will with cell count 18,000 with 70% neutrophils, hemoglobin 13.1.  Lactic acid 1.0.  ESR 90.  Urinalysis notable for no white blood cells, leukocyte esterase/nitrate negative, and gravity greater than 1.046.  Will cultures x 2 were collected prior to administration of a dose of Rocephin, given empirically for leukocytosis.  Per my interpretation, EKG in ED demonstrated the following: Sinus rhythm with heart rate 89, normal intervals, no evidence of T wave or ST changes, clearness of ST elevation.  Imaging and additional notable ED work-up: Chest x-ray, 1 view, for radiology read, shows no evidence of acute pulm or cardiopulmonary process, and evidence of gastric edema, effusion, or pneumothorax.  Lives with her She notes of acute process, continued severe fracture.  Plain films of the lumbar spine showed no evidence of acute fracture.  CTA chest, performed radiology read, shows no evidence of aortic aneurysm, dissection, and or any evidence of acute pulmonary embolism.  CTA chest did show small pericardial effusion, but otherwise no evidence of acute cardiopulmonary process, clearance of Entrikin edema,  effusion, or pneumothorax.  CT chest also shows multiple old thoracic compression fractures as well as kyphoplasty at multiple levels, but no evidence of acute fracture.  Ensuing CT of  the thoracic spine, per original read shows multiple old compression fractures in the thoracic and upper lumbar vertebral bodies, without any evidence of interval change, while showing no evidence of acute or subacute fracture, nor any evidence of significant spinal stenosis.  CT thoracic spine also shows unremarkable paraspinal soft tissue.  While in the ED, the following were administered: Fentanyl 50 mcg IV x 1 dose, Toradol 15 mg IV x 1, morphine 6 mg IV times, Zanaflex 2 mg p.o. x 1, tramadol 50 mg p.o. x 1, Rocephin, normal saline into the rules.  Subsequently, the patient was admitted for further evaluation management of intractable thoracic back discomfort, with presentation also notable for clinical evidence of dehydration, in the presence of SIRS criteria.     Review of Systems: As per HPI otherwise 10 point review of systems negative.   Past Medical History:  Diagnosis Date   Asthma    trigger from dog hair, dust , grass   Benign essential tremor    Folate deficiency 03/20/2018   Hypercholesteremia    Osteoporosis 2009   on forteo   Vertigo    Vitamin B12 deficiency 03/20/2018    Past Surgical History:  Procedure Laterality Date   CATARACT EXTRACTION Left 04/2012   KYPHOPLASTY     KYPHOPLASTY N/A 02/15/2015   Procedure: Thoracic eight Kyphoplasty;  Surgeon: Barnett Abu, MD;  Location: [facility];  Service: Neurosurgery;  Laterality: N/A;  T8 Kyphoplasty   TONSILLECTOMY     TUBAL LIGATION      Social History:  reports that she has never smoked. She has never used smokeless tobacco. She reports that she does not drink alcohol and does not use drugs.   Allergies  Allergen Reactions   Penicillins Itching    Did it involve swelling of the face/tongue/throat, SOB, or low BP? Unknown Did it involve sudden or severe rash/hives, skin peeling, or any reaction on the inside of your mouth or nose? Unknown Did you need to seek medical attention at a hospital or doctor's office?  No When did it last happen?      72 yrs old? If all above answers are "NO", may proceed with cephalosporin use.    Family History  Problem Relation Age of Onset   Heart disease Father    Hypothyroidism Mother    Osteoporosis Mother    Hypertension Mother     Family history reviewed and not pertinent    Prior to Admission medications   Medication Sig Start Date End Date Taking? Authorizing Provider  Calcium Carb-Cholecalciferol (CALCIUM 500 +D PO) Take 1,000 mg by mouth daily.    [provider]  fenofibrate 54 MG tablet Take 54 mg by mouth at bedtime.     [provider]  folic acid (FOLVITE) 1 MG tablet Take 1 tablet (1 mg total) by mouth daily. Patient not taking: Reported on 03/07/2020 03/23/18   Rodolph Bong, MD  ibandronate (BONIVA) 150 MG tablet Take 150 mg by mouth every 30 (thirty) days. Take in the morning with a full glass of water, on an empty stomach, and do not take anything else by mouth or lie down for the next 30 min.    [provider]  Multiple Vitamin (MULTIVITAMIN) tablet Take 1 tablet by mouth daily.    [provider]  naproxen sodium (ALEVE) 220 MG tablet Take 440 mg by mouth 2 (two) times daily as needed (pain).    [provider]  primidone (MYSOLINE) 250 MG tablet Take 250 mg by mouth 2 (two) times daily.     [provider]  propranolol ER (INDERAL LA) 80 MG 24 hr capsule Take 80 mg by mouth daily. 12/27/17   [provider]  rosuvastatin (CRESTOR) 20 MG tablet Take 20 mg by mouth at bedtime.     [provider]  vitamin B-12 (CYANOCOBALAMIN) 1000 MCG tablet Take 1 tablet (1,000 mcg total) by mouth daily. 03/22/18   Rodolph Bong, MD     Objective    Physical Exam: Vitals:   04/21/22 2044 04/21/22 2122 04/21/22 2149 04/21/22 2150  BP: 121/87 133/67  (!) 103/58  Pulse: (!) 123 (!) 120  (!) 119  Resp: (!) 32 (!) 24  20  Temp: 99.2 F (37.3 C) 98.3 F (36.8 C) 98.3 F  (36.8 C) 98.3 F (36.8 C)  TempSrc: Oral Oral Oral Oral  SpO2: 91% 97%  97%  Weight:      Height:        General: appears to be stated age; alert, oriented Skin: warm, dry, no rash Head:  AT/[place] Mouth:  Oral mucosa membranes appear dry, normal dentition Neck: supple; trachea midline Heart: Mildly tachycardic, but regular; did not appreciate any M/R/G Lungs: CTAB, did not appreciate any wheezes, rales, or rhonchi Abdomen: + BS; soft, ND, NT Vascular: 2+ pedal pulses b/l; 2+ radial pulses b/l Extremities: no peripheral edema, no muscle wasting Neuro: strength and sensation intact in upper and lower extremities b/l    Labs on Admission: I have personally reviewed following labs and imaging studies  CBC: Recent Labs  Lab 04/21/22 0933  WBC 18.8*  NEUTROABS 14.6*  HGB 13.1  HCT 39.7  MCV 92.3  PLT 370   Basic Metabolic Panel: Recent Labs  Lab 04/21/22 0933  NA 136  K 4.1  CL 102  CO2 23  GLUCOSE 129*  BUN 10  CREATININE 0.69  CALCIUM 10.1   GFR: Estimated Creatinine Clearance: 54.9 mL/min (by C-G formula based on SCr of 0.69 mg/dL). Liver Function Tests: No results for input(s): "AST", "ALT", "ALKPHOS", "BILITOT", "PROT", "ALBUMIN" in the last 168 hours. No results for input(s): "LIPASE", "AMYLASE" in the last 168 hours. No results for input(s): "AMMONIA" in the last 168 hours. Coagulation Profile: No results for input(s): "INR", "PROTIME" in the last 168 hours. Cardiac Enzymes: No results for input(s): "CKTOTAL", "CKMB", "CKMBINDEX", "TROPONINI" in the last 168 hours. BNP (last 3 results) No results for input(s): "PROBNP" in the last 8760 hours. HbA1C: No results for input(s): "HGBA1C" in the last 72 hours. CBG: No results for input(s): "GLUCAP" in the last 168 hours. Lipid Profile: No results for input(s): "CHOL", "HDL", "LDLCALC", "TRIG", "CHOLHDL", "LDLDIRECT" in the last 72 hours. Thyroid Function Tests: No results for input(s): "TSH", "T4TOTAL",  "FREET4", "T3FREE", "THYROIDAB" in the last 72 hours. Anemia Panel: No results for input(s): "VITAMINB12", "FOLATE", "FERRITIN", "TIBC", "IRON", "RETICCTPCT" in the last 72 hours. Urine analysis:    Component Value Date/Time   COLORURINE YELLOW 04/21/2022 1556   APPEARANCEUR CLEAR 04/21/2022 1556   LABSPEC >1.046 (H) 04/21/2022 1556   PHURINE 6.0 04/21/2022 1556   GLUCOSEU NEGATIVE 04/21/2022 1556   HGBUR NEGATIVE 04/21/2022 1556   BILIRUBINUR NEGATIVE 04/21/2022 1556   KETONESUR NEGATIVE 04/21/2022 1556   PROTEINUR TRACE (A) 04/21/2022 1556   NITRITE NEGATIVE  04/21/2022 1556   LEUKOCYTESUR NEGATIVE 04/21/2022 1556    Radiological Exams on Admission: CT T-SPINE NO CHARGE  Result Date: 04/21/2022 CLINICAL DATA:  Back pain EXAM: CT THORACIC SPINE WITHOUT CONTRAST TECHNIQUE: Multidetector CT images of the thoracic were obtained using the standard protocol without intravenous contrast. RADIATION DOSE REDUCTION: This exam was performed according to the departmental dose-optimization program which includes automated exposure control, adjustment of the mA and/or kV according to patient size and/or use of iterative reconstruction technique. COMPARISON:  Thoracic spine radiograph done on 04/21/2022, previous CT chest done on 03/19/2018 FINDINGS: Alignment: Alignment of posterior margins of vertebral bodies is within normal limits. No significant changes are noted. Vertebrae: Compression fractures are noted in T4, T5, T6, T7, T8, T9 and T10 vertebral bodies. No significant interval change. There is previous vertebroplasty in the bodies of T8, T9 and L1 vertebrae with no significant change. Paraspinal and other soft tissues: There is no central spinal stenosis. Paraspinal soft tissues are unremarkable. Disc levels: These calcification in the disc space at T10-T11 level. There is no significant narrowing of neural foramina. IMPRESSION: There are multiple old compression fractures in thoracic and upper  lumbar vertebral bodies as described in the body with no significant change. There is vertebroplasty in few thoracic and lumbar vertebral bodies with no significant change. No recent fracture is seen. There is no significant spinal stenosis. Paraspinal soft tissues are unremarkable. Electronically Signed   By: Ernie Avena M.D.   On: 04/21/2022 14:29   CT Angio Chest PE W and/or Wo Contrast  Result Date: 04/21/2022 CLINICAL DATA:  Back pain. EXAM: CT ANGIOGRAPHY CHEST WITH CONTRAST TECHNIQUE: Multidetector CT imaging of the chest was performed using the standard protocol during bolus administration of intravenous contrast. Multiplanar CT image reconstructions and MIPs were obtained to evaluate the vascular anatomy. RADIATION DOSE REDUCTION: This exam was performed according to the departmental dose-optimization program which includes automated exposure control, adjustment of the mA and/or kV according to patient size and/or use of iterative reconstruction technique. CONTRAST:  60mL OMNIPAQUE IOHEXOL 350 MG/ML SOLN COMPARISON:  March 19, 2018. FINDINGS: Cardiovascular: Satisfactory opacification of the pulmonary arteries to the segmental level. No evidence of pulmonary embolism. Normal heart size. Small pericardial effusion. Minimal coronary artery calcifications are noted. Mediastinum/Nodes: No enlarged mediastinal, hilar, or axillary lymph nodes. Thyroid gland, trachea, and esophagus demonstrate no significant findings. Lungs/Pleura: Lungs are clear. No pleural effusion or pneumothorax. Upper Abdomen: No acute abnormality. Musculoskeletal: Stable kyphoplasty at multiple levels as well as multiple old compression fractures. No definite acute osseous abnormality is noted. Review of the MIP images confirms the above findings. IMPRESSION: No definite evidence of pulmonary embolus. Small pericardial effusion. Minimal coronary artery calcifications are noted. Multiple old thoracic compression fractures are noted  as well as kyphoplasty at multiple levels. Aortic Atherosclerosis (ICD10-I70.0). Electronically Signed   By: Lupita Raider M.D.   On: 04/21/2022 14:20   DG Lumbar Spine Complete  Result Date: 04/21/2022 CLINICAL DATA:  Evaluate for compression fracture or pleural effusion. EXAM: LUMBAR SPINE - COMPLETE 4+ VIEW COMPARISON:  None Available. FINDINGS: There is no evidence of acute fracture. Grade 1 anterolisthesis of L5 at S1. Vertebral augmentation of L1 vertebral body. Mild superior endplate deformity of L3 vertebral body, unchanged. Facet joint arthropathy prominent at L5-S1. Diffuse osteopenia. IMPRESSION: 1. Mild superior endplate deformity of L3 vertebral body. 2. Grade 1 anterolisthesis of L5 at S1. 3. Facet joint arthropathy prominent at L5-S1. Electronically Signed   By: Leona Carry  Ahmed D.O.   On: 04/21/2022 10:32   DG Chest 1 View  Result Date: 04/21/2022 CLINICAL DATA:  Evaluate for pleural effusion. EXAM: CHEST  1 VIEW COMPARISON:  Chest radiograph dated March 19, 2018 FINDINGS: The heart size and mediastinal contours are within normal limits. Both lungs are clear. Thoracic spondylosis and vertebral body augmentation at multiple levels. IMPRESSION: No active disease. Electronically Signed   By: Larose HiresImran  Ahmed D.O.   On: 04/21/2022 10:22   DG Thoracic Spine 2 View  Result Date: 04/21/2022 CLINICAL DATA:  Evaluate for compression fractures, pleural effusion. EXAM: THORACIC SPINE 2 VIEWS COMPARISON:  Chest radiograph dated March 19, 2018 FINDINGS: There is no evidence of thoracic spine fracture. Kyphosis with vertebral body augmentation at multiple levels and wedge-shaped deformities of multiple vertebral bodies is not significantly changed. IMPRESSION: 1. No acute fracture. 2. Kyphosis with vertebral body augmentation at multiple levels and wedge-shaped deformities of multiple vertebral bodies is not significantly changed. Electronically Signed   By: Larose HiresImran  Ahmed D.O.   On: 04/21/2022 10:21       Assessment/Plan   Principal Problem:   Thoracic back pain Active Problems:   Benign essential tremor   Hyperlipidemia   Dehydration   SIRS (systemic inflammatory response syndrome)   Mild intermittent asthma     #) Acute thoracic back pain: 2 days of persistent sharp midline nonradiating thoracic back discomfort, worsening with flexion of the thoracic spine.  CT thoracic spine shows no evidence of acute process, including no evidence of acute fracture nor any evidence of significant spinal stenosis, also demonstrating unremarkable paraspinal soft tissues.  No overt evidence at this time to suggest paraspinal abscess no evidence of discitis or osteomyelitis, will acknowledging that MRI would be the more sensitive imaging modality for this.  Initial inflammatory markers in the form of ESR not elevated.  Will check CRP, and continue to monitor clinically to determine need for escalation in radiographic evaluation, including potential MRI of the thoracic spine.  Given her history of osteoporosis and multiple prior thoracic compression fractures, it is possible that she may have a very small new compression fracture in the thoracic spine that has not yet declared itself radiographically.  Overall, suspect that differential favors musculoskeletal etiologies, particularly given worsening with flexion of the thoracic spine.  As noted above, CT chest shows no evidence of aortic dissection or aneurysm, nor any evidence of acute cardiopulmonary process other than the evidence of small pleural effusion, the size of which appears unlikely to be a significant contributor to the patient's presenting back discomfort.  No evidence of associated acute focal neurologic deficits.  Also expand workup to include potential for referred back pain from intra-abdominal source, including checking of liver enzymes as well as lipase.   Plan: Prn IV Toradol.  Physical therapy consult placed.  Check CRP, CMP, urinary  drug screen, lipase.  This is [facility] ordered.  Repeat CBC in the morning.                   #) Dehydration: Clinical suspicion for such, including the appearance of dry oral mucous membranes as well as laboratory findings notable for  UA demonstrating elevated specific gravity, in addition to improvement in mild initial soft blood pressure with IV fluids.  Appears to be in the setting of   patient's reported a recent decline in oral intake following the passing of her husband.  Of note, renal function appears to be at baseline.  Status post 2 L of  NS bolus administered at [facility] earlier this evening.   Plan: Monitor strict I's and O's.  Daily weights.  CMP in the morning. IVF's in form of lactated Ringer's at 125 cc/h x 10 hours.Marland Kitchen                #) SIRS criteria present: Presentation associated with leukocytosis and tachycardia.  However, in the absence of e/o underlying infection at this time, criteria for sepsis not currently met.  Specifically, urinalysis not suggestive of UTI, will CTA chest shows no evidence of acute cardiopulmonary process, including no evidence of infiltrate.  All patient presents with 2 days of thoracic back discomfort, CT thoracic spine shows no evidence of acute process, while initial inflammatory markers are nonelevated, rendering the possibility of discitis versus osteomyelitis to be less likely.  Presentation associated with any meningeal signs to increase index of suspicion for meningitis.  Will further evaluate for any evidence of intra-abdominal infection via checking of liver enzymes, although no overt abdominal discomfort reported at this time.  No acute respiratory symptoms to increase index of suspicion for COVID.  suspect non-infectious factors contributing to these SIRS findings, including dehydration leading to hemoconcentrated impacts on leukocytosis as well as ramifications towards presenting tachycardia. patient appears  hemodynamically stable at this time.  Consequently, will refrain from initiation of additional IV antibiotics at this time.  Of note, blood cultures x 2 were collected at [facility], lactate was found to be nonelevated.  She did receive a single dose of Rocephin at [facility].   Plan: Repeat CBC with diff in the AM.  Monitor strict I's and O's and daily weights.  Monitor on telemetry. Refraining from IV abx for now, as above.  Check IV fluids, as above.  Add on procalcitonin level.  Check CRP.  CMP.  Check urinary dressing, TSH.           #) History of mild intermittent asthma: Documented history of such.  No clinical evidence to suggest acute exacerbation thereof at this time.  Plan: Prn albuterol nebulizer.  Check serum magnesium level.            #) Benign essential tremor: Documented history of such, on propranolol as well as primidone as an outpatient.  Given the patient's initial soft blood pressure, will hold this evening's doses of propranolol primidone.  Plan: Hold home primidone and propranolol this evening, as above.                 #) Hyperlipidemia: documented h/o such. On high intensity rosuvastatin as well as fenofibrate as outpatient.   Plan: continue home statin and fenofibrate.         DVT prophylaxis: SCD's   Code Status: Full code Family Communication: none Disposition Plan: Per Rounding Team Consults called: none;  Admission status: Observation     I SPENT GREATER THAN 75  MINUTES IN CLINICAL CARE TIME/MEDICAL DECISION-MAKING IN COMPLETING THIS ADMISSION.      Chaney Born Samiya Mervin DO [facility]  From 7PM - 7AM   04/21/2022, 10:51 PM

## 2022-04-21 NOTE — ED Notes (Signed)
Pt ambulated to and from bathroom with this RN

## 2022-04-21 NOTE — ED Provider Notes (Signed)
EMERGENCY DEPARTMENT AT [facility] Provider Note   CSN: [id number] Arrival date & time: 04/21/22  0750     History  Chief Complaint  Patient presents with   Shortness of Breath    Hannah Perez is a 72 y.o. female.  HPI     72 year old female comes in with chief complaint of back pain.  Patient is having mid and lower thoracic back pain with deep inspiration, starting last night.  Patient denies any injury.  She does have a history of osteoporosis and compression fractures.  The pain is similar to her prior compression fracture, but more intense.  Review of systems is positive for mild cough, chest pain.  No fevers, chills.  Patient denies any URI-like symptoms  Pt has no hx of PE, DVT and denies any exogenous hormone (testosterone / estrogen) use, long distance travels or surgery in the past 6 weeks, active cancer, recent immobilization.   Home Medications Prior to Admission medications   Medication Sig Start Date End Date Taking? Authorizing Provider  Calcium Carb-Cholecalciferol (CALCIUM 500 +D PO) Take 1,000 mg by mouth daily.    [provider]  fenofibrate 54 MG tablet Take 54 mg by mouth at bedtime.     [provider]  folic acid (FOLVITE) 1 MG tablet Take 1 tablet (1 mg total) by mouth daily. Patient not taking: Reported on 03/07/2020 03/23/18   Rodolph Bong, MD  ibandronate (BONIVA) 150 MG tablet Take 150 mg by mouth every 30 (thirty) days. Take in the morning with a full glass of water, on an empty stomach, and do not take anything else by mouth or lie down for the next 30 min.    [provider]  Multiple Vitamin (MULTIVITAMIN) tablet Take 1 tablet by mouth daily.    [provider]  naproxen sodium (ALEVE) 220 MG tablet Take 440 mg by mouth 2 (two) times daily as needed (pain).    [provider]  primidone (MYSOLINE) 250 MG tablet Take 250 mg by mouth 2 (two) times daily.     [provider]  propranolol ER (INDERAL LA) 80 MG 24 hr capsule Take 80 mg by mouth daily. 12/27/17   [provider]  rosuvastatin (CRESTOR) 20 MG tablet Take 20 mg by mouth at bedtime.     [provider]  vitamin B-12 (CYANOCOBALAMIN) 1000 MCG tablet Take 1 tablet (1,000 mcg total) by mouth daily. 03/22/18   Rodolph Bong, MD      Allergies    Penicillins    Review of Systems   Review of Systems  All other systems reviewed and are negative.   Physical Exam Updated Vital Signs BP 101/69   Pulse (!) 113   Temp 99 F (37.2 C) (Oral)   Resp (!) 28   Ht 5\' 3"  (1.6 m)   Wt 61.2 kg   LMP 06/24/2003   SpO2 94%   BMI 23.91 kg/m  Physical Exam Vitals and nursing note reviewed.  Constitutional:      Appearance: She is well-developed.  HENT:     Head: Atraumatic.  Cardiovascular:     Rate and Rhythm: Normal rate.  Pulmonary:     Effort: Pulmonary effort is normal.     Breath sounds: No decreased breath sounds, wheezing, rhonchi or rales.  Musculoskeletal:     Cervical back: Normal range of motion and neck supple.     Comments: Patient has tenderness with palpation of the thoracic  spine diffusely, but it is worse over the lower thoracic region and upper lumbar region.  Skin:    General: Skin is warm and dry.  Neurological:     Mental Status: She is alert and oriented to person, place, and time.     ED Results / Procedures / Treatments   Labs (all labs ordered are listed, but only abnormal results are displayed) Labs Reviewed  BASIC METABOLIC PANEL - Abnormal; Notable for the following components:      Result Value   Glucose, Bld 129 (*)    All other components within normal limits  CBC WITH DIFFERENTIAL/PLATELET - Abnormal; Notable for the following components:   WBC 18.8 (*)    Neutro Abs 14.6 (*)    Monocytes Absolute 1.5 (*)    Abs Immature Granulocytes 0.08 (*)    All other components within normal limits  CULTURE, BLOOD (ROUTINE X 2)  CULTURE, BLOOD  (ROUTINE X 2)  URINALYSIS, ROUTINE W REFLEX MICROSCOPIC  URINALYSIS, W/ REFLEX TO CULTURE (INFECTION SUSPECTED)  LACTIC ACID, PLASMA  LACTIC ACID, PLASMA  SEDIMENTATION RATE  TROPONIN I (HIGH SENSITIVITY)  TROPONIN I (HIGH SENSITIVITY)    EKG EKG Interpretation  Date/Time:  Tuesday April 21 2022 07:56:00 EDT Ventricular Rate:  98 PR Interval:  183 QRS Duration: 82 QT Interval:  344 QTC Calculation: 440 R Axis:   15 Text Interpretation: Sinus rhythm Low voltage, precordial leads No acute changes Nonspecific ST and T wave abnormality Confirmed by Derwood KaplanNanavati, Kilani Joffe [phone]([id number]) on 04/21/2022 8:30:12 AM  Radiology CT T-SPINE NO CHARGE  Result Date: 04/21/2022 CLINICAL DATA:  Back pain EXAM: CT THORACIC SPINE WITHOUT CONTRAST TECHNIQUE: Multidetector CT images of the thoracic were obtained using the standard protocol without intravenous contrast. RADIATION DOSE REDUCTION: This exam was performed according to the departmental dose-optimization program which includes automated exposure control, adjustment of the mA and/or kV according to patient size and/or use of iterative reconstruction technique. COMPARISON:  Thoracic spine radiograph done on 04/21/2022, previous CT chest done on 03/19/2018 FINDINGS: Alignment: Alignment of posterior margins of vertebral bodies is within normal limits. No significant changes are noted. Vertebrae: Compression fractures are noted in T4, T5, T6, T7, T8, T9 and T10 vertebral bodies. No significant interval change. There is previous vertebroplasty in the bodies of T8, T9 and L1 vertebrae with no significant change. Paraspinal and other soft tissues: There is no central spinal stenosis. Paraspinal soft tissues are unremarkable. Disc levels: These calcification in the disc space at T10-T11 level. There is no significant narrowing of neural foramina. IMPRESSION: There are multiple old compression fractures in thoracic and upper lumbar vertebral bodies as described in the body  with no significant change. There is vertebroplasty in few thoracic and lumbar vertebral bodies with no significant change. No recent fracture is seen. There is no significant spinal stenosis. Paraspinal soft tissues are unremarkable. Electronically Signed   By: Ernie AvenaPalani  Rathinasamy M.D.   On: 04/21/2022 14:29   CT Angio Chest PE W and/or Wo Contrast  Result Date: 04/21/2022 CLINICAL DATA:  Back pain. EXAM: CT ANGIOGRAPHY CHEST WITH CONTRAST TECHNIQUE: Multidetector CT imaging of the chest was performed using the standard protocol during bolus administration of intravenous contrast. Multiplanar CT image reconstructions and MIPs were obtained to evaluate the vascular anatomy. RADIATION DOSE REDUCTION: This exam was performed according to the departmental dose-optimization program which includes automated exposure control, adjustment of the mA and/or kV according to patient size and/or use of iterative reconstruction technique. CONTRAST:  60mL OMNIPAQUE  IOHEXOL 350 MG/ML SOLN COMPARISON:  March 19, 2018. FINDINGS: Cardiovascular: Satisfactory opacification of the pulmonary arteries to the segmental level. No evidence of pulmonary embolism. Normal heart size. Small pericardial effusion. Minimal coronary artery calcifications are noted. Mediastinum/Nodes: No enlarged mediastinal, hilar, or axillary lymph nodes. Thyroid gland, trachea, and esophagus demonstrate no significant findings. Lungs/Pleura: Lungs are clear. No pleural effusion or pneumothorax. Upper Abdomen: No acute abnormality. Musculoskeletal: Stable kyphoplasty at multiple levels as well as multiple old compression fractures. No definite acute osseous abnormality is noted. Review of the MIP images confirms the above findings. IMPRESSION: No definite evidence of pulmonary embolus. Small pericardial effusion. Minimal coronary artery calcifications are noted. Multiple old thoracic compression fractures are noted as well as kyphoplasty at multiple levels.  Aortic Atherosclerosis (ICD10-I70.0). Electronically Signed   By: Lupita Raider M.D.   On: 04/21/2022 14:20   DG Lumbar Spine Complete  Result Date: 04/21/2022 CLINICAL DATA:  Evaluate for compression fracture or pleural effusion. EXAM: LUMBAR SPINE - COMPLETE 4+ VIEW COMPARISON:  None Available. FINDINGS: There is no evidence of acute fracture. Grade 1 anterolisthesis of L5 at S1. Vertebral augmentation of L1 vertebral body. Mild superior endplate deformity of L3 vertebral body, unchanged. Facet joint arthropathy prominent at L5-S1. Diffuse osteopenia. IMPRESSION: 1. Mild superior endplate deformity of L3 vertebral body. 2. Grade 1 anterolisthesis of L5 at S1. 3. Facet joint arthropathy prominent at L5-S1. Electronically Signed   By: Larose Hires D.O.   On: 04/21/2022 10:32   DG Chest 1 View  Result Date: 04/21/2022 CLINICAL DATA:  Evaluate for pleural effusion. EXAM: CHEST  1 VIEW COMPARISON:  Chest radiograph dated March 19, 2018 FINDINGS: The heart size and mediastinal contours are within normal limits. Both lungs are clear. Thoracic spondylosis and vertebral body augmentation at multiple levels. IMPRESSION: No active disease. Electronically Signed   By: Larose Hires D.O.   On: 04/21/2022 10:22   DG Thoracic Spine 2 View  Result Date: 04/21/2022 CLINICAL DATA:  Evaluate for compression fractures, pleural effusion. EXAM: THORACIC SPINE 2 VIEWS COMPARISON:  Chest radiograph dated March 19, 2018 FINDINGS: There is no evidence of thoracic spine fracture. Kyphosis with vertebral body augmentation at multiple levels and wedge-shaped deformities of multiple vertebral bodies is not significantly changed. IMPRESSION: 1. No acute fracture. 2. Kyphosis with vertebral body augmentation at multiple levels and wedge-shaped deformities of multiple vertebral bodies is not significantly changed. Electronically Signed   By: Larose Hires D.O.   On: 04/21/2022 10:21    Procedures Procedures    Medications Ordered in  ED Medications  sodium chloride 0.9 % bolus 1,000 mL (has no administration in time range)  fentaNYL (SUBLIMAZE) injection 50 mcg (50 mcg Intravenous Given 04/21/22 1024)  morphine (PF) 4 MG/ML injection 6 mg (6 mg Intravenous Given 04/21/22 1310)  ketorolac (TORADOL) 15 MG/ML injection 15 mg (15 mg Intravenous Given 04/21/22 1310)  sodium chloride 0.9 % bolus 1,000 mL (0 mLs Intravenous Stopped 04/21/22 1520)  iohexol (OMNIPAQUE) 350 MG/ML injection 100 mL (60 mLs Intravenous Contrast Given 04/21/22 1343)  traMADol (ULTRAM) tablet 50 mg (50 mg Oral Given 04/21/22 1536)  tiZANidine (ZANAFLEX) tablet 2 mg (2 mg Oral Given 04/21/22 1536)    ED Course/ Medical Decision Making/ A&P                             Medical Decision Making Amount and/or Complexity of Data Reviewed Labs: ordered. Radiology: ordered.  Risk Prescription drug management. Decision regarding hospitalization.   72 year old patient comes in with chief complaint of pleuritic chest pain.  She has history of benign essential tremor, hyperlipidemia and osteoporosis with compression fracture requiring kyphoplasty.  Differential diagnoses considered for this patient includes pneumothorax, pneumonia, pleural effusion, PE, pleurisy, compression fracture, pathologic fracture.  Plan is to initiate x-ray of the chest, spine and give patient something for pain.  Basic labs also ordered.  Reassessment: Patient's initial chest x-ra y was independently interpreted.  There is no evidence of pleural effusion there is no evidence of pneumonia.  Per radiologist, there is no evidence of compression fractures.  Patient is tachycardic.  Her shock index is greater than 1.  She is tachypneic.  We will proceed with CT angio PE.  Patient also has elevated white count, possibly she has a pneumonia that is not radiographically picked up on x-ray.   Reassessment: CT PE is negative.  Patient's blood pressure has been soft, dropping less than 90 on 2  separate occasions. She remains tachycardic, tachypneic and with shock index rater than 1.  I think it would be best to admit this patient.  I will get urine analysis and urine cultures.  Patient has no UTI-like symptoms.  We will also get lactic acid and sed rate.  Patient continues to have discomfort, she lives by herself.  Will request admission. I do not have clear indication for infection at this time, but we will start ceftriaxone.   Final Clinical Impression(s) / ED Diagnoses Final diagnoses:  SIRS (systemic inflammatory response syndrome)  Pleurisy    Rx / DC Orders ED Discharge Orders     None         Derwood Kaplan, MD 04/21/22 [phone]

## 2022-04-21 NOTE — ED Triage Notes (Signed)
Pt bib [facility] with c/o back pain, pain in back with deep inspiration since last night. Denies injury or falls. Aspirin at 0230 this am with little relief. Endorses recent need for cane with ambulation

## 2022-04-21 NOTE — ED Notes (Signed)
George with cl called for transport 

## 2022-04-22 ENCOUNTER — Observation Stay (HOSPITAL_COMMUNITY): Payer: Medicare Other

## 2022-04-22 DIAGNOSIS — M549 Dorsalgia, unspecified: Secondary | ICD-10-CM | POA: Diagnosis present

## 2022-04-22 DIAGNOSIS — E860 Dehydration: Secondary | ICD-10-CM | POA: Diagnosis present

## 2022-04-22 DIAGNOSIS — E538 Deficiency of other specified B group vitamins: Secondary | ICD-10-CM | POA: Diagnosis present

## 2022-04-22 DIAGNOSIS — R000 Tachycardia, unspecified: Secondary | ICD-10-CM | POA: Diagnosis present

## 2022-04-22 DIAGNOSIS — Z6828 Body mass index (BMI) 28.0-28.9, adult: Secondary | ICD-10-CM | POA: Diagnosis not present

## 2022-04-22 DIAGNOSIS — M810 Age-related osteoporosis without current pathological fracture: Secondary | ICD-10-CM | POA: Diagnosis present

## 2022-04-22 DIAGNOSIS — M7989 Other specified soft tissue disorders: Secondary | ICD-10-CM | POA: Diagnosis not present

## 2022-04-22 DIAGNOSIS — Z87311 Personal history of (healed) other pathological fracture: Secondary | ICD-10-CM | POA: Diagnosis not present

## 2022-04-22 DIAGNOSIS — E7800 Pure hypercholesterolemia, unspecified: Secondary | ICD-10-CM | POA: Diagnosis present

## 2022-04-22 DIAGNOSIS — Z880 Allergy status to penicillin: Secondary | ICD-10-CM | POA: Diagnosis not present

## 2022-04-22 DIAGNOSIS — J4520 Mild intermittent asthma, uncomplicated: Secondary | ICD-10-CM | POA: Diagnosis present

## 2022-04-22 DIAGNOSIS — G8929 Other chronic pain: Secondary | ICD-10-CM | POA: Diagnosis present

## 2022-04-22 DIAGNOSIS — E669 Obesity, unspecified: Secondary | ICD-10-CM | POA: Diagnosis present

## 2022-04-22 DIAGNOSIS — E782 Mixed hyperlipidemia: Secondary | ICD-10-CM | POA: Diagnosis not present

## 2022-04-22 DIAGNOSIS — M546 Pain in thoracic spine: Secondary | ICD-10-CM | POA: Diagnosis present

## 2022-04-22 DIAGNOSIS — M40204 Unspecified kyphosis, thoracic region: Secondary | ICD-10-CM | POA: Diagnosis not present

## 2022-04-22 DIAGNOSIS — R091 Pleurisy: Secondary | ICD-10-CM | POA: Diagnosis present

## 2022-04-22 DIAGNOSIS — G250 Essential tremor: Secondary | ICD-10-CM | POA: Diagnosis present

## 2022-04-22 DIAGNOSIS — D72829 Elevated white blood cell count, unspecified: Secondary | ICD-10-CM | POA: Diagnosis present

## 2022-04-22 DIAGNOSIS — Z79899 Other long term (current) drug therapy: Secondary | ICD-10-CM | POA: Diagnosis not present

## 2022-04-22 DIAGNOSIS — B349 Viral infection, unspecified: Secondary | ICD-10-CM | POA: Diagnosis present

## 2022-04-22 DIAGNOSIS — R6510 Systemic inflammatory response syndrome (SIRS) of non-infectious origin without acute organ dysfunction: Secondary | ICD-10-CM | POA: Diagnosis present

## 2022-04-22 DIAGNOSIS — Z8262 Family history of osteoporosis: Secondary | ICD-10-CM | POA: Diagnosis not present

## 2022-04-22 LAB — CBC WITH DIFFERENTIAL/PLATELET
Abs Immature Granulocytes: 0.07 10*3/uL (ref 0.00–0.07)
Basophils Absolute: 0 10*3/uL (ref 0.0–0.1)
Basophils Relative: 0 %
Eosinophils Absolute: 0.1 10*3/uL (ref 0.0–0.5)
Eosinophils Relative: 1 %
HCT: 30 % — ABNORMAL LOW (ref 36.0–46.0)
Hemoglobin: 9.7 g/dL — ABNORMAL LOW (ref 12.0–15.0)
Immature Granulocytes: 1 %
Lymphocytes Relative: 22 %
Lymphs Abs: 2.7 10*3/uL (ref 0.7–4.0)
MCH: 30.3 pg (ref 26.0–34.0)
MCHC: 32.3 g/dL (ref 30.0–36.0)
MCV: 93.8 fL (ref 80.0–100.0)
Monocytes Absolute: 1.1 10*3/uL — ABNORMAL HIGH (ref 0.1–1.0)
Monocytes Relative: 9 %
Neutro Abs: 8.2 10*3/uL — ABNORMAL HIGH (ref 1.7–7.7)
Neutrophils Relative %: 67 %
Platelets: 269 10*3/uL (ref 150–400)
RBC: 3.2 MIL/uL — ABNORMAL LOW (ref 3.87–5.11)
RDW: 13.9 % (ref 11.5–15.5)
WBC: 12.2 10*3/uL — ABNORMAL HIGH (ref 4.0–10.5)
nRBC: 0 % (ref 0.0–0.2)

## 2022-04-22 LAB — COMPREHENSIVE METABOLIC PANEL
ALT: 13 U/L (ref 0–44)
AST: 12 U/L — ABNORMAL LOW (ref 15–41)
Albumin: 2.6 g/dL — ABNORMAL LOW (ref 3.5–5.0)
Alkaline Phosphatase: 61 U/L (ref 38–126)
Anion gap: 7 (ref 5–15)
BUN: 6 mg/dL — ABNORMAL LOW (ref 8–23)
CO2: 21 mmol/L — ABNORMAL LOW (ref 22–32)
Calcium: 8.3 mg/dL — ABNORMAL LOW (ref 8.9–10.3)
Chloride: 105 mmol/L (ref 98–111)
Creatinine, Ser: 0.72 mg/dL (ref 0.44–1.00)
GFR, Estimated: >60 mL/min (ref >60)
Glucose, Bld: 107 mg/dL — ABNORMAL HIGH (ref 70–99)
Potassium: 3.6 mmol/L (ref 3.5–5.1)
Sodium: 133 mmol/L — ABNORMAL LOW (ref 135–145)
Total Bilirubin: 0.6 mg/dL (ref 0.3–1.2)
Total Protein: 5.7 g/dL — ABNORMAL LOW (ref 6.5–8.1)

## 2022-04-22 LAB — RAPID URINE DRUG SCREEN, HOSP PERFORMED
Amphetamines: NOT DETECTED
Barbiturates: POSITIVE — AB
Benzodiazepines: NOT DETECTED
Cocaine: NOT DETECTED
Opiates: POSITIVE — AB
Tetrahydrocannabinol: NOT DETECTED

## 2022-04-22 LAB — TSH: TSH: 2.029 u[IU]/mL (ref 0.350–4.500)

## 2022-04-22 LAB — MAGNESIUM: Magnesium: 2.1 mg/dL (ref 1.7–2.4)

## 2022-04-22 LAB — PROCALCITONIN: Procalcitonin: <0.1 ng/mL

## 2022-04-22 LAB — C-REACTIVE PROTEIN
CRP: 15.8 mg/dL — ABNORMAL HIGH (ref <1.0)
CRP: 19.4 mg/dL — ABNORMAL HIGH (ref <1.0)

## 2022-04-22 LAB — LIPASE, BLOOD: Lipase: 22 U/L (ref 11–51)

## 2022-04-22 MED ORDER — GADOBUTROL 1 MMOL/ML IV SOLN
7.0000 mL | Freq: Once | INTRAVENOUS | Status: AC | PRN
  Administered 2022-04-22: 7 mL via INTRAVENOUS

## 2022-04-22 MED ORDER — MORPHINE SULFATE (PF) 2 MG/ML IV SOLN: 2.0000 mg | INTRAVENOUS | Status: DC | PRN

## 2022-04-22 MED ORDER — TRAMADOL HCL 50 MG PO TABS: 50.0000 mg | ORAL_TABLET | Freq: Four times a day (QID) | ORAL | Status: DC | PRN

## 2022-04-22 MED ORDER — LIDOCAINE 5 % EX PTCH
1.0000 | MEDICATED_PATCH | CUTANEOUS | Status: DC
  Administered 2022-04-22 – 2022-04-23 (×2): 1 via TRANSDERMAL
  Filled 2022-04-22 (×2): qty 1

## 2022-04-22 NOTE — Progress Notes (Signed)
PROGRESS NOTE        PATIENT DETAILS Name: Hannah Perez Age: 72 y.o. Sex: female Date of Birth: 05/12/1950 Admit Date: 04/21/2022 Admitting Physician Clydie Braunondell A Smith, MD ZOX:[id number]:McNeill, Toniann FailWendy, MD  Brief Summary: Patient is a 72 y.o.  female with history of essential tremor, HLD, multiple compression fractures-s/p T8 kyphoplasty February 2017-presented with severe mid thoracic back pain and leukocytosis.    Significant events: 4/9>> [facility]  Significant studies: 4/9>> CT angio chest: No PE 4/9>> CT T-spine: Multiple old compression fractures in the thoracic/upper lumbar vertebral bodies with no significant change.  Significant microbiology data: 4/9>> blood culture: No growth  Procedures: None  Consults: None  Subjective: Lying comfortably in bed-denies any chest pain or shortness of breath.  Feels better-pain in her mid thoracic back is still there but better controlled this morning.  Objective: Vitals: Blood pressure 96/60, pulse 99, temperature 98.8 F (37.1 C), temperature source Oral, resp. rate 20, height 5\' 3"  (1.6 m), weight 73.9 kg, last menstrual period 06/24/2003, SpO2 97 %.   Exam: Gen Exam:Alert awake-not in any distress HEENT:atraumatic, normocephalic Chest: B/L clear to auscultation anteriorly CVS:S1S2 regular Abdomen:soft non tender, non distended Extremities:no edema Neurology: Non focal Skin: no rash  Pertinent Labs/Radiology:    Latest Ref Rng & Units 04/22/2022    4:11 AM 04/21/2022    9:33 AM 03/22/2018    4:12 AM  CBC  WBC 4.0 - 10.5 K/uL 12.2  18.8  7.6   Hemoglobin 12.0 - 15.0 g/dL 9.7  09.813.1  9.2   Hematocrit 36.0 - 46.0 % 30.0  39.7  28.2   Platelets 150 - 400 K/uL 269  370  374     Lab Results  Component Value Date   NA 133 (L) 04/22/2022   K 3.6 04/22/2022   CL 105 04/22/2022   CO2 21 (L) 04/22/2022     Assessment/Plan: Intractable midthoracic pain Unclear etiology This is mostly with  inspiration CTA chest negative for PE-other imaging negative for any thoracic spine abnormalities Thankfully pain is much better this morning-she is very comfortable-added as needed tramadol at patient's request. Has significant leukocytosis with elevated CRP-blood cultures negative so far-obtaining MRI of the thoracic spine to rule out discitis. Given clinical stability-stable neurological exam-monitor off antibiotics-allow cultures to come in-await MRI  HLD Statin/fenofibrate  Essential tremor Stable Propranolol/primidone will be resumed once pharmacy medication reconciliation has been completed.  History of compression fractures Osteoporosis Resume bisphosphonates in the outpatient setting  BMI: Estimated body mass index is 28.86 kg/m as calculated from the following:   Height as of this encounter: 5\' 3"  (1.6 m).   Weight as of this encounter: 73.9 kg.   Code status:   Code Status: Full Code   DVT Prophylaxis: SCDs Start: 04/21/22 2152   Family Communication: None at bedside   Disposition Plan: Status is: Observation The patient will require care spanning > 2 midnights and should be moved to inpatient because: Severity of illness   Planned Discharge Destination:Home   Diet: Diet Order             Diet regular Room service appropriate? Yes; Fluid consistency: Thin  Diet effective now                     Antimicrobial agents: Anti-infectives (From admission, onward)    Start  Dose/Rate Route Frequency Ordered Stop   04/21/22 1615  cefTRIAXone (ROCEPHIN) 2 g in sodium chloride 0.9 % 100 mL IVPB        2 g 200 mL/hr over 30 Minutes Intravenous  Once 04/21/22 1613 04/21/22 1736        MEDICATIONS: Scheduled Meds:  calcium-vitamin D  1 tablet Oral Daily   fenofibrate  54 mg Oral QHS   lidocaine  1 patch Transdermal Q24H   primidone  250 mg Oral BID   rosuvastatin  20 mg Oral QHS   Continuous Infusions: PRN Meds:.acetaminophen **OR**  acetaminophen, albuterol, ketorolac, melatonin, metoprolol tartrate, morphine injection, ondansetron (ZOFRAN) IV, traMADol   I have personally reviewed following labs and imaging studies  LABORATORY DATA: CBC: Recent Labs  Lab 04/21/22 0933 04/22/22 0411  WBC 18.8* 12.2*  NEUTROABS 14.6* 8.2*  HGB 13.1 9.7*  HCT 39.7 30.0*  MCV 92.3 93.8  PLT 370 269    Basic Metabolic Panel: Recent Labs  Lab 04/21/22 0933 04/22/22 0411  NA 136 133*  K 4.1 3.6  CL 102 105  CO2 23 21*  GLUCOSE 129* 107*  BUN 10 6*  CREATININE 0.69 0.72  CALCIUM 10.1 8.3*  MG  --  2.1    GFR: Estimated Creatinine Clearance: 63.9 mL/min (by C-G formula based on SCr of 0.72 mg/dL).  Liver Function Tests: Recent Labs  Lab 04/22/22 0411  AST 12*  ALT 13  ALKPHOS 61  BILITOT 0.6  PROT 5.7*  ALBUMIN 2.6*   Recent Labs  Lab 04/22/22 0411  LIPASE 22   No results for input(s): "AMMONIA" in the last 168 hours.  Coagulation Profile: No results for input(s): "INR", "PROTIME" in the last 168 hours.  Cardiac Enzymes: No results for input(s): "CKTOTAL", "CKMB", "CKMBINDEX", "TROPONINI" in the last 168 hours.  BNP (last 3 results) No results for input(s): "PROBNP" in the last 8760 hours.  Lipid Profile: No results for input(s): "CHOL", "HDL", "LDLCALC", "TRIG", "CHOLHDL", "LDLDIRECT" in the last 72 hours.  Thyroid Function Tests: Recent Labs    04/22/22 0411  TSH 2.029    Anemia Panel: No results for input(s): "VITAMINB12", "FOLATE", "FERRITIN", "TIBC", "IRON", "RETICCTPCT" in the last 72 hours.  Urine analysis:    Component Value Date/Time   COLORURINE YELLOW 04/21/2022 1556   APPEARANCEUR CLEAR 04/21/2022 1556   LABSPEC >1.046 (H) 04/21/2022 1556   PHURINE 6.0 04/21/2022 1556   GLUCOSEU NEGATIVE 04/21/2022 1556   HGBUR NEGATIVE 04/21/2022 1556   BILIRUBINUR NEGATIVE 04/21/2022 1556   KETONESUR NEGATIVE 04/21/2022 1556   PROTEINUR TRACE (A) 04/21/2022 1556   NITRITE NEGATIVE  04/21/2022 1556   LEUKOCYTESUR NEGATIVE 04/21/2022 1556    Sepsis Labs: Lactic Acid, Venous    Component Value Date/Time   LATICACIDVEN 1.0 04/21/2022 1642    MICROBIOLOGY: Recent Results (from the past 240 hour(s))  Blood culture (routine x 2)     Status: None (Preliminary result)   Collection Time: 04/21/22  3:56 PM   Specimen: BLOOD LEFT HAND  Result Value Ref Range Status   Specimen Description   Final    BLOOD LEFT HAND Performed at [facility], [address], [place], [place] [address]    Special Requests   Final    BOTTLES DRAWN AEROBIC AND ANAEROBIC Blood Culture adequate volume Performed at [facility], [address], [place], [place] [address]    Culture   Final    NO GROWTH < 12 HOURS Performed at [facility],  [address] [place], [place] [address]    Report Status PENDING  Incomplete    RADIOLOGY STUDIES/RESULTS: CT T-SPINE NO CHARGE  Result Date: 04/21/2022 CLINICAL DATA:  Back pain EXAM: CT THORACIC SPINE WITHOUT CONTRAST TECHNIQUE: Multidetector CT images of the thoracic were obtained using the standard protocol without intravenous contrast. RADIATION DOSE REDUCTION: This exam was performed according to the departmental dose-optimization program which includes automated exposure control, adjustment of the mA and/or kV according to patient size and/or use of iterative reconstruction technique. COMPARISON:  Thoracic spine radiograph done on 04/21/2022, previous CT chest done on 03/19/2018 FINDINGS: Alignment: Alignment of posterior margins of vertebral bodies is within normal limits. No significant changes are noted. Vertebrae: Compression fractures are noted in T4, T5, T6, T7, T8, T9 and T10 vertebral bodies. No significant interval change. There is previous vertebroplasty in the bodies of T8, T9 and L1 vertebrae with no significant change. Paraspinal and other soft tissues: There is no central spinal stenosis. Paraspinal soft  tissues are unremarkable. Disc levels: These calcification in the disc space at T10-T11 level. There is no significant narrowing of neural foramina. IMPRESSION: There are multiple old compression fractures in thoracic and upper lumbar vertebral bodies as described in the body with no significant change. There is vertebroplasty in few thoracic and lumbar vertebral bodies with no significant change. No recent fracture is seen. There is no significant spinal stenosis. Paraspinal soft tissues are unremarkable. Electronically Signed   By: Ernie Avena M.D.   On: 04/21/2022 14:29   CT Angio Chest PE W and/or Wo Contrast  Result Date: 04/21/2022 CLINICAL DATA:  Back pain. EXAM: CT ANGIOGRAPHY CHEST WITH CONTRAST TECHNIQUE: Multidetector CT imaging of the chest was performed using the standard protocol during bolus administration of intravenous contrast. Multiplanar CT image reconstructions and MIPs were obtained to evaluate the vascular anatomy. RADIATION DOSE REDUCTION: This exam was performed according to the departmental dose-optimization program which includes automated exposure control, adjustment of the mA and/or kV according to patient size and/or use of iterative reconstruction technique. CONTRAST:  20mL OMNIPAQUE IOHEXOL 350 MG/ML SOLN COMPARISON:  March 19, 2018. FINDINGS: Cardiovascular: Satisfactory opacification of the pulmonary arteries to the segmental level. No evidence of pulmonary embolism. Normal heart size. Small pericardial effusion. Minimal coronary artery calcifications are noted. Mediastinum/Nodes: No enlarged mediastinal, hilar, or axillary lymph nodes. Thyroid gland, trachea, and esophagus demonstrate no significant findings. Lungs/Pleura: Lungs are clear. No pleural effusion or pneumothorax. Upper Abdomen: No acute abnormality. Musculoskeletal: Stable kyphoplasty at multiple levels as well as multiple old compression fractures. No definite acute osseous abnormality is noted. Review of  the MIP images confirms the above findings. IMPRESSION: No definite evidence of pulmonary embolus. Small pericardial effusion. Minimal coronary artery calcifications are noted. Multiple old thoracic compression fractures are noted as well as kyphoplasty at multiple levels. Aortic Atherosclerosis (ICD10-I70.0). Electronically Signed   By: Lupita Raider M.D.   On: 04/21/2022 14:20   DG Lumbar Spine Complete  Result Date: 04/21/2022 CLINICAL DATA:  Evaluate for compression fracture or pleural effusion. EXAM: LUMBAR SPINE - COMPLETE 4+ VIEW COMPARISON:  None Available. FINDINGS: There is no evidence of acute fracture. Grade 1 anterolisthesis of L5 at S1. Vertebral augmentation of L1 vertebral body. Mild superior endplate deformity of L3 vertebral body, unchanged. Facet joint arthropathy prominent at L5-S1. Diffuse osteopenia. IMPRESSION: 1. Mild superior endplate deformity of L3 vertebral body. 2. Grade 1 anterolisthesis of L5 at S1. 3. Facet joint arthropathy prominent at L5-S1. Electronically Signed  By: Larose Hires D.O.   On: 04/21/2022 10:32   DG Chest 1 View  Result Date: 04/21/2022 CLINICAL DATA:  Evaluate for pleural effusion. EXAM: CHEST  1 VIEW COMPARISON:  Chest radiograph dated March 19, 2018 FINDINGS: The heart size and mediastinal contours are within normal limits. Both lungs are clear. Thoracic spondylosis and vertebral body augmentation at multiple levels. IMPRESSION: No active disease. Electronically Signed   By: Larose Hires D.O.   On: 04/21/2022 10:22   DG Thoracic Spine 2 View  Result Date: 04/21/2022 CLINICAL DATA:  Evaluate for compression fractures, pleural effusion. EXAM: THORACIC SPINE 2 VIEWS COMPARISON:  Chest radiograph dated March 19, 2018 FINDINGS: There is no evidence of thoracic spine fracture. Kyphosis with vertebral body augmentation at multiple levels and wedge-shaped deformities of multiple vertebral bodies is not significantly changed. IMPRESSION: 1. No acute fracture. 2.  Kyphosis with vertebral body augmentation at multiple levels and wedge-shaped deformities of multiple vertebral bodies is not significantly changed. Electronically Signed   By: Larose Hires D.O.   On: 04/21/2022 10:21     LOS: 0 days   Jeoffrey Massed, MD  [facility]    To contact the attending provider between 7A-7P or the covering provider during after hours 7P-7A, please log into the web site [email or web address] and access using universal [place] password for that web site. If you do not have the password, please call the hospital operator.  04/22/2022, 11:26 AM

## 2022-04-22 NOTE — Progress Notes (Signed)
   04/21/22 2300  Assess: MEWS Score  Temp 98.6 F (37 C)  BP 114/62  MAP (mmHg) 79  Pulse Rate (!) 113  ECG Heart Rate (!) 114  Resp (!) 24  SpO2 97 %  Assess: MEWS Score  MEWS Temp 0  MEWS Systolic 0  MEWS Pulse 2  MEWS RR 1  MEWS LOC 0  MEWS Score 3  MEWS Score Color Yellow  Assess: if the MEWS score is Yellow or Red  Were vital signs taken at a resting state? Yes  Focused Assessment No change from prior assessment  Does the patient meet 2 or more of the SIRS criteria? No  Does the patient have a confirmed or suspected source of infection? No  MEWS guidelines implemented  Yes, yellow  Treat  MEWS Interventions Considered administering scheduled or prn medications/treatments as ordered  Take Vital Signs  Increase Vital Sign Frequency  Yellow: Q2hr x1, continue Q4hrs until patient remains green for 12hrs  Escalate  MEWS: Escalate Yellow: Discuss with charge nurse and consider notifying provider and/or RRT  Notify: Charge Nurse/RN  Name of Charge Nurse/RN Notified Allene Pyo, RN  Provider Notification  Provider Name/Title Joneen Roach, MD  Date Provider Notified 04/21/22  Time Provider Notified 2330  Method of Notification Page  Notification Reason Other (Comment) (mews score)  Provider response See new orders  Date of Provider Response 04/21/22  Time of Provider Response 2332  Assess: SIRS CRITERIA  SIRS Temperature  0  SIRS Pulse 1  SIRS Respirations  1  SIRS WBC 0  SIRS Score Sum  2   Patient had a positive outcome, vital signs stable

## 2022-04-22 NOTE — Evaluation (Signed)
Physical Therapy Evaluation Patient Details Name: Hannah Perez MRN: [id number] DOB: [birth year] Today's Date: 04/22/2022  History of Present Illness  72 y/o F admitted to [facility] on 4/9 for mid thoracic back pain. CT of T spine showing multiple old compression fractures with no significant change, negative for PE. PMHx: essential tremor, HLD, multiple compression fractures s/p T8 kyphoplasty 2017, osteoporosis  Clinical Impression  Pt presents today with thoracic back pain, functioning close to her mobility baseline. Pt reports pain along her upper T-spine, noted tenderness to palpation along the paraspinal musculature. Pt reports no change in pain with mobility of BUE but reports pain occurs after use of walking stick and pain comes and goes. With mobility, pt with very decreased gait speed and cautious gait, utilizing environmental supports for balance, however gait speed and confidence improving with RW. Pt reporting mild pain increasing in her back with ambulation but with cues for posture correction, pain improving. Educated pt on relaxing B shoulders and retracting them with upright posture in sitting and standing. With use of RW, pt mobilizing well, declining any concerns upon discharge, declining stair attempt but reports no concerns, educated on having family around for first getting back into her home. Pt would benefit from OPPT upon discharge for progressing balance and addressing back pain, pt agreeable. Pt with no further acute PT needs, will sign off, pt with all needed DME at this time.        Recommendations for follow up therapy are one component of a multi-disciplinary discharge planning process, led by the attending physician.  Recommendations may be updated based on patient status, additional functional criteria and insurance authorization.  Follow Up Recommendations       Assistance Recommended at Discharge PRN  Patient can return home with the following  Help with stairs or ramp for  entrance    Equipment Recommendations None recommended by PT  Recommendations for Other Services       Functional Status Assessment Patient has had a recent decline in their functional status and demonstrates the ability to make significant improvements in function in a reasonable and predictable amount of time.     Precautions / Restrictions Precautions Precautions: Fall;Other (comment) Precaution Comments: old compression fractures, reports limited lifting Restrictions Weight Bearing Restrictions: No      Mobility  Bed Mobility Overal bed mobility: Modified Independent             General bed mobility comments: no assist, pt performing log roll method    Transfers Overall transfer level: Modified independent Equipment used: Rolling walker (2 wheels)               General transfer comment: standing without RW initially, returning with RW after ambulation trial, cued for management    Ambulation/Gait Ambulation/Gait assistance: Supervision, Min guard Gait Distance (Feet): 220 Feet Assistive device: Rolling walker (2 wheels), None Gait Pattern/deviations: Step-through pattern, Decreased stride length, Trunk flexed Gait velocity: WFL with use of RW     General Gait Details: brief ambulation trial of ~8 feet with very decreased and cautious gait, utilizing sink for UE support. With use of RW, pt with increased gait speed and steadiness, improved confidence noted. Educated pt on proper use of RW for proximity, posture, and gaze  Stairs            Wheelchair Mobility    Modified Rankin (Stroke Patients Only)       Balance Overall balance assessment: Needs assistance Sitting-balance support: No upper extremity supported,  Feet supported Sitting balance-Leahy Scale: Good     Standing balance support: Bilateral upper extremity supported, During functional activity, Reliant on assistive device for balance Standing balance-Leahy Scale: Poor Standing  balance comment: reliant on external support for balance                             Pertinent Vitals/Pain Pain Assessment Pain Assessment: Faces Faces Pain Scale: No hurt    Home Living Family/patient expects to be discharged to:: Private residence Living Arrangements: Alone Available Help at Discharge: Family;Available PRN/intermittently Type of Home: House Home Access: Stairs to enter Entrance Stairs-Rails: Right Entrance Stairs-Number of Steps: 6   Home Layout: Two level;Able to live on main level with bedroom/bathroom Home Equipment: Rolling Walker (2 wheels);Shower seat;Other (comment) (walking stick) Additional Comments: pt reports her children moved her bedroom downstairs so she is able to stay on the first level. Has 2 children that live nearby and are able to assist    Prior Function Prior Level of Function : Independent/Modified Independent;Driving             Mobility Comments: Pt reports intermittent use of  walking stick and being fearful of falling, although she denies any recent falls. Pt drives       Hand Dominance   Dominant Hand: Right    Extremity/Trunk Assessment   Upper Extremity Assessment Upper Extremity Assessment: Generalized weakness (at least 3/5, no resistance applied due to back pain and reports limited lifting)    Lower Extremity Assessment Lower Extremity Assessment: Overall WFL for tasks assessed    Cervical / Trunk Assessment Cervical / Trunk Assessment: Kyphotic  Communication   Communication: No difficulties  Cognition Arousal/Alertness: Awake/alert Behavior During Therapy: WFL for tasks assessed/performed Overall Cognitive Status: Within Functional Limits for tasks assessed                                 General Comments: A&Ox4, pleasant throughout session        General Comments General comments (skin integrity, edema, etc.): HR elevating to 110s with ambulation, SPO2 stable on room air. Pt  found on 1L but tolerating room air, left off and RN aware    Exercises     Assessment/Plan    PT Assessment All further PT needs can be met in the next venue of care  PT Problem List Pain;Decreased balance;Decreased mobility;Decreased knowledge of use of DME       PT Treatment Interventions      PT Goals (Current goals can be found in the Care Plan section)  Acute Rehab PT Goals Patient Stated Goal: go home PT Goal Formulation: All assessment and education complete, DC therapy    Frequency       Co-evaluation               AM-PAC PT "6 Clicks" Mobility  Outcome Measure Help needed turning from your back to your side while in a flat bed without using bedrails?: None Help needed moving from lying on your back to sitting on the side of a flat bed without using bedrails?: None Help needed moving to and from a bed to a chair (including a wheelchair)?: None Help needed standing up from a chair using your arms (e.g., wheelchair or bedside chair)?: None Help needed to walk in hospital room?: A Little Help needed climbing 3-5 steps with a railing? : A  Little 6 Click Score: 22    End of Session Equipment Utilized During Treatment: Gait belt Activity Tolerance: Patient tolerated treatment well Patient left: in bed;with call bell/phone within reach Nurse Communication: Mobility status PT Visit Diagnosis: Pain;Unsteadiness on feet (R26.81) Pain - part of body:  (thoracic spine)    Time: 9381-0175 PT Time Calculation (min) (ACUTE ONLY): 35 min   Charges:   PT Evaluation $PT Eval Moderate Complexity: 1 Mod PT Treatments $Gait Training: 8-22 mins        Lindalou Hose, PT DPT Acute Rehabilitation Services Office [phone]   Hannah Perez 04/22/2022, 1:48 PM

## 2022-04-23 ENCOUNTER — Inpatient Hospital Stay (HOSPITAL_COMMUNITY): Payer: Medicare Other

## 2022-04-23 DIAGNOSIS — G250 Essential tremor: Secondary | ICD-10-CM | POA: Diagnosis not present

## 2022-04-23 DIAGNOSIS — R6510 Systemic inflammatory response syndrome (SIRS) of non-infectious origin without acute organ dysfunction: Secondary | ICD-10-CM | POA: Diagnosis not present

## 2022-04-23 DIAGNOSIS — M7989 Other specified soft tissue disorders: Secondary | ICD-10-CM

## 2022-04-23 LAB — CBC
HCT: 32 % — ABNORMAL LOW (ref 36.0–46.0)
Hemoglobin: 10.8 g/dL — ABNORMAL LOW (ref 12.0–15.0)
MCH: 31.4 pg (ref 26.0–34.0)
MCHC: 33.8 g/dL (ref 30.0–36.0)
MCV: 93 fL (ref 80.0–100.0)
Platelets: 276 10*3/uL (ref 150–400)
RBC: 3.44 MIL/uL — ABNORMAL LOW (ref 3.87–5.11)
RDW: 13.9 % (ref 11.5–15.5)
WBC: 9.9 10*3/uL (ref 4.0–10.5)
nRBC: 0 % (ref 0.0–0.2)

## 2022-04-23 LAB — COMPREHENSIVE METABOLIC PANEL
ALT: 13 U/L (ref 0–44)
AST: 17 U/L (ref 15–41)
Albumin: 2.7 g/dL — ABNORMAL LOW (ref 3.5–5.0)
Alkaline Phosphatase: 69 U/L (ref 38–126)
Anion gap: 8 (ref 5–15)
BUN: 6 mg/dL — ABNORMAL LOW (ref 8–23)
CO2: 22 mmol/L (ref 22–32)
Calcium: 8.6 mg/dL — ABNORMAL LOW (ref 8.9–10.3)
Chloride: 105 mmol/L (ref 98–111)
Creatinine, Ser: 0.63 mg/dL (ref 0.44–1.00)
GFR, Estimated: >60 mL/min (ref >60)
Glucose, Bld: 94 mg/dL (ref 70–99)
Potassium: 3.3 mmol/L — ABNORMAL LOW (ref 3.5–5.1)
Sodium: 135 mmol/L (ref 135–145)
Total Bilirubin: 0.6 mg/dL (ref 0.3–1.2)
Total Protein: 6 g/dL — ABNORMAL LOW (ref 6.5–8.1)

## 2022-04-23 LAB — CULTURE, BLOOD (ROUTINE X 2)

## 2022-04-23 LAB — C-REACTIVE PROTEIN: CRP: 17.1 mg/dL — ABNORMAL HIGH (ref <1.0)

## 2022-04-23 NOTE — Progress Notes (Signed)
BLE and RUE venous duplex has been completed.    Results can be found under chart review under CV PROC. 04/23/2022 1:40 PM Nyelle Wolfson RVT, RDMS

## 2022-04-23 NOTE — Discharge Summary (Addendum)
PATIENT DETAILS Name: LILAC DESIR Age: 72 y.o. Sex: female Date of Birth: [birth year] MRN: [id number]. Admitting Physician: Maretta Bees, MD FBX:[id number], Toniann Fail, MD  Admit Date: 04/21/2022 Discharge date: 04/23/2022  Recommendations for Outpatient Follow-up:  Follow up with PCP in 1-2 weeks Please obtain CMP/CBC in one week Follow blood cultures until final  Admitted From:  Home  Disposition: Home   Discharge Condition: good  CODE STATUS:   Code Status: Full Code   Diet recommendation:  Diet Order             Diet - low sodium heart healthy           Diet regular Room service appropriate? Yes; Fluid consistency: Thin  Diet effective now                    Brief Summary: Patient is a 72 y.o.  female with history of essential tremor, HLD, multiple compression fractures-s/p T8 kyphoplasty February 2017-presented with severe mid thoracic back pain and leukocytosis.     Significant events: 4/9>> admit to [facility]   Significant studies: 4/09>> CT angio chest: No PE 4/09>> CT T-spine: Multiple old compression fractures in the thoracic/upper lumbar vertebral bodies with no significant change. 4/10>> MRI T-spine: No evidence of discitis/osteomyelitis. 4/11>> right upper extremity duplex: No evidence of DVT in the right upper extremity 4/11>> bilateral lower extremity Doppler: No DVT   Significant microbiology data: 4/9>> blood culture: No growth   Procedures: None   Consults: None  Brief Hospital Course: Intractable midthoracic pain Unclear etiology-it was mostly with inspiration CTA chest was negative for PE Since she had leukocytosis with elevated CRP-MRI of the thoracic spine was performed-it did not show any discitis or any infectious source With just supportive care-her acute pain has resolved (has some chronic pain at baseline from prior compression fractures)-her leukocytosis has resolved without any antibiotics.  Her last dose of tramadol was  yesterday-and has not required tramadol for close to 24 hours.  In retrospect-?  Pleurisy from a viral syndrome was the cause of her pain. Given stability-lack of fever-overall improvement-suspect she can be discharged and monitored closely in the outpatient setting by her primary care practitioner. PCP to continue to follow blood cultures until final.   HLD Statin/fenofibrate   Essential tremor Stable Propranolol/primidone will be resumed once pharmacy medication reconciliation has been completed.   History of compression fractures Osteoporosis Resume bisphosphonates in the outpatient setting  Note-discussion with patient-I called patient's daughter Sarah-no answer-left voicemail.  Obesity: Estimated body mass index is 28.24 kg/m as calculated from the following:   Height as of this encounter: 5\' 3"  (1.6 m).   Weight as of this encounter: 72.3 kg.     Discharge Diagnoses:  Principal Problem:   Thoracic back pain Active Problems:   Benign essential tremor   Hyperlipidemia   Dehydration   SIRS (systemic inflammatory response syndrome)   Mild intermittent asthma   Intractable back pain   Discharge Instructions:  Activity:  As tolerated with Full fall precautions use walker/cane & assistance as needed  Discharge Instructions     Call MD for:  difficulty breathing, headache or visual disturbances   Complete by: As directed    Call MD for:  severe uncontrolled pain   Complete by: As directed    Diet - low sodium heart healthy   Complete by: As directed    Discharge instructions   Complete by: As directed    Follow with Primary MD  Gweneth DimitriMcNeill, Wendy, MD in 1-2 weeks  Please get a complete blood count and chemistry panel checked by your Primary MD at your next visit, and again as instructed by your Primary MD.  Get Medicines reviewed and adjusted: Please take all your medications with you for your next visit with your Primary MD  Laboratory/radiological data: Please  request your Primary MD to go over all hospital tests and procedure/radiological results at the follow up, please ask your Primary MD to get all Hospital records sent to his/her office.  In some cases, they will be blood work, cultures and biopsy results pending at the time of your discharge. Please request that your primary care M.D. follows up on these results.  Also Note the following: If you experience worsening of your admission symptoms, develop shortness of breath, life threatening emergency, suicidal or homicidal thoughts you must seek medical attention immediately by calling 911 or calling your MD immediately  if symptoms less severe.  You must read complete instructions/literature along with all the possible adverse reactions/side effects for all the Medicines you take and that have been prescribed to you. Take any new Medicines after you have completely understood and accpet all the possible adverse reactions/side effects.   Do not drive when taking Pain medications or sleeping medications (Benzodaizepines)  Do not take more than prescribed Pain, Sleep and Anxiety Medications. It is not advisable to combine anxiety,sleep and pain medications without talking with your primary care practitioner  Special Instructions: If you have smoked or chewed Tobacco  in the last 2 yrs please stop smoking, stop any regular Alcohol  and or any Recreational drug use.  Wear Seat belts while driving.  Please note: You were cared for by a hospitalist during your hospital stay. Once you are discharged, your primary care physician will handle any further medical issues. Please note that NO REFILLS for any discharge medications will be authorized once you are discharged, as it is imperative that you return to your primary care physician (or establish a relationship with a primary care physician if you do not have one) for your post hospital discharge needs so that they can reassess your need for medications and  monitor your lab values.   Increase activity slowly   Complete by: As directed       Allergies as of 04/23/2022       Reactions   Penicillins Itching   Did it involve swelling of the face/tongue/throat, SOB, or low BP? Unknown Did it involve sudden or severe rash/hives, skin peeling, or any reaction on the inside of your mouth or nose? Unknown Did you need to seek medical attention at a hospital or doctor's office? No When did it last happen?      72 yrs old? If all above answers are "NO", may proceed with cephalosporin use.        Medication List     TAKE these medications    CALCIUM 500 +D PO Take 1,000 mg by mouth daily.   cyanocobalamin 1000 MCG tablet Commonly known as: VITAMIN B12 Take 1 tablet (1,000 mcg total) by mouth daily.   fenofibrate 54 MG tablet Take 54 mg by mouth at bedtime.   folic acid 1 MG tablet Commonly known as: FOLVITE Take 1 tablet (1 mg total) by mouth daily.   ibandronate 150 MG tablet Commonly known as: BONIVA Take 150 mg by mouth every 30 (thirty) days. Take in the morning with a full glass of water, on an empty stomach, and  do not take anything else by mouth or lie down for the next 30 min.   multivitamin tablet Take 1 tablet by mouth daily.   naproxen sodium 220 MG tablet Commonly known as: ALEVE Take 440 mg by mouth 2 (two) times daily as needed (pain).   primidone 250 MG tablet Commonly known as: MYSOLINE Take 250 mg by mouth 2 (two) times daily.   propranolol ER 80 MG 24 hr capsule Commonly known as: INDERAL LA Take 80 mg by mouth daily.   rosuvastatin 20 MG tablet Commonly known as: CRESTOR Take 20 mg by mouth at bedtime.        Follow-up Information     Gweneth Dimitri, MD. Schedule an appointment as soon as possible for a visit in 1 week(s).   Specialty: Family Medicine Contact information: [address] [place] [address] [phone]                Allergies  Allergen Reactions   Penicillins  Itching    Did it involve swelling of the face/tongue/throat, SOB, or low BP? Unknown Did it involve sudden or severe rash/hives, skin peeling, or any reaction on the inside of your mouth or nose? Unknown Did you need to seek medical attention at a hospital or doctor's office? No When did it last happen?      72 yrs old? If all above answers are "NO", may proceed with cephalosporin use.     Other Procedures/Studies: VAS [place] LOWER EXTREMITY VENOUS (DVT)  Result Date: 04/23/2022  Lower Venous DVT Study Patient Name:  DENIM START  Date of Exam:   04/23/2022 Medical Rec #: [id number]      Accession #:    [id number] Date of Birth: 05-24-1950       Patient Gender: F Patient Age:   69 years Exam Location:  [facility] Procedure:      VAS [place] LOWER EXTREMITY VENOUS (DVT) Referring Phys: Jeoffrey Massed --------------------------------------------------------------------------------  Indications: Edema.  Comparison Study: No previous exams Performing Technologist: Jody Hill RVT, RDMS  Examination Guidelines: A complete evaluation includes B-mode imaging, spectral Doppler, color Doppler, and power Doppler as needed of all accessible portions of each vessel. Bilateral testing is considered an integral part of a complete examination. Limited examinations for reoccurring indications may be performed as noted. The reflux portion of the exam is performed with the patient in reverse Trendelenburg.  +---------+---------------+---------+-----------+----------+--------------+ RIGHT    CompressibilityPhasicitySpontaneityPropertiesThrombus Aging +---------+---------------+---------+-----------+----------+--------------+ CFV      Full           Yes      Yes                                 +---------+---------------+---------+-----------+----------+--------------+ SFJ      Full                                                         +---------+---------------+---------+-----------+----------+--------------+ FV Prox  Full           Yes      Yes                                 +---------+---------------+---------+-----------+----------+--------------+ FV Mid  Full           Yes      Yes                                 +---------+---------------+---------+-----------+----------+--------------+ FV DistalFull           Yes      Yes                                 +---------+---------------+---------+-----------+----------+--------------+ PFV      Full                                                        +---------+---------------+---------+-----------+----------+--------------+ POP      Full           Yes      Yes                                 +---------+---------------+---------+-----------+----------+--------------+ PTV      Full                                                        +---------+---------------+---------+-----------+----------+--------------+ PERO     Full                                                        +---------+---------------+---------+-----------+----------+--------------+   +---------+---------------+---------+-----------+----------+--------------+ LEFT     CompressibilityPhasicitySpontaneityPropertiesThrombus Aging +---------+---------------+---------+-----------+----------+--------------+ CFV      Full           Yes      Yes                                 +---------+---------------+---------+-----------+----------+--------------+ SFJ      Full                                                        +---------+---------------+---------+-----------+----------+--------------+ FV Prox  Full           Yes      Yes                                 +---------+---------------+---------+-----------+----------+--------------+ FV Mid   Full           Yes      Yes                                  +---------+---------------+---------+-----------+----------+--------------+ FV DistalFull  Yes      Yes                                 +---------+---------------+---------+-----------+----------+--------------+ PFV      Full                                                        +---------+---------------+---------+-----------+----------+--------------+ POP      Full           Yes      Yes                                 +---------+---------------+---------+-----------+----------+--------------+ PTV      Full                                                        +---------+---------------+---------+-----------+----------+--------------+ PERO     Full                                                        +---------+---------------+---------+-----------+----------+--------------+    Summary: BILATERAL: - No evidence of deep vein thrombosis seen in the lower extremities, bilaterally. -No evidence of popliteal cyst, bilaterally.   *See table(s) above for measurements and observations.    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 Bilateral testing is considered an integral part of a complete examination. Limited examinations for reoccurring  indications may be performed as noted.  Right Findings: +----------+------------+---------+-----------+----------+---------------------+ RIGHT     CompressiblePhasicitySpontaneousProperties       Summary        +----------+------------+---------+-----------+----------+---------------------+ IJV           Full       Yes       Yes                                    +----------+------------+---------+-----------+----------+---------------------+ Subclavian    Full       Yes       Yes                                    +----------+------------+---------+-----------+----------+---------------------+  Axillary      Full       Yes       Yes                                    +----------+------------+---------+-----------+----------+---------------------+ Brachial      Full       Yes       Yes               only one of paired                                                       brachials visualized  +----------+------------+---------+-----------+----------+---------------------+ Radial        Full                                                        +----------+------------+---------+-----------+----------+---------------------+ Ulnar         Full                                                        +----------+------------+---------+-----------+----------+---------------------+ Cephalic      Full                                                        +----------+------------+---------+-----------+----------+---------------------+ Basilic       Full       Yes       Yes                                    +----------+------------+---------+-----------+----------+---------------------+  Left Findings: +----------+------------+---------+-----------+----------+-------+ LEFT      CompressiblePhasicitySpontaneousPropertiesSummary +----------+------------+---------+-----------+----------+-------+ Subclavian    Full       Yes       Yes                       +----------+------------+---------+-----------+----------+-------+  Summary:  Right: No evidence of deep vein thrombosis in the upper extremity. No evidence of superficial vein thrombosis in the upper extremity.  Left: No evidence of thrombosis in the subclavian.  *See table(s) above for measurements and observations.     Preliminary    MR THORACIC SPINE W WO CONTRAST  Result Date: 04/22/2022 CLINICAL DATA:  Mid back pain.  Concern for discitis osteomyelitis. EXAM: MRI THORACIC WITHOUT AND WITH CONTRAST TECHNIQUE: Multiplanar and multiecho pulse sequences of the thoracic spine were obtained without and with intravenous contrast. CONTRAST:  7mL GADAVIST GADOBUTROL 1 MMOL/ML IV SOLN COMPARISON:  Thoracic spine CT 04/21/2022 FINDINGS: Alignment: Exaggerated thoracic kyphosis. Vertebrae: Severe chronic appearing midthoracic spine compression deformities at T7-T10. Additional compression deformities are also present at L1 and L3. There is no evidence of abnormal  contrast enhancement in the intervertebral discs or at the endplates to suggest discitis osteomyelitis. Cord:  No evidence of cord signal abnormality. Paraspinal and other soft tissues: There may be an abnormal flow void in the right internal jugular vein (series 5, image 4), which can be seen in the setting of a DVT. Recommend further evaluation with a right upper extremity DVT ultrasound. There are small bilateral pleural effusions. Disc levels: No evidence of high-grade spinal canal stenosis. There is severe left-sided neural foraminal stenosis at T8-T9. IMPRESSION: 1. No evidence of discitis osteomyelitis in the thoracic spine. 2. 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 No high-grade spinal canal stenosis. 3. Severe chronic appearing midthoracic spine compression deformities at T7-T10. Additional compression deformities are also present at L1 and L3. 4. Possible abnormal flow void in the right internal jugular vein, which can be  seen in the setting of a DVT. Recommend further evaluation with a right upper extremity DVT ultrasound. 5. Small bilateral pleural effusions. Electronically Signed   By: Lorenza Cambridge M.D.   On: 04/22/2022 13:36   CT T-SPINE NO CHARGE  Result Date: 04/21/2022 CLINICAL DATA:  Back pain EXAM: CT THORACIC SPINE WITHOUT CONTRAST TECHNIQUE: Multidetector CT images of the thoracic were obtained using the standard protocol without intravenous contrast. RADIATION DOSE REDUCTION: This exam was performed according to the departmental dose-optimization program which includes automated exposure control, adjustment of the mA and/or kV according to patient size and/or use of iterative reconstruction technique. COMPARISON:  Thoracic spine radiograph done on 04/21/2022, previous CT chest done on 03/19/2018 FINDINGS: Alignment: Alignment of posterior margins of vertebral bodies is within normal limits. No significant changes are noted. Vertebrae: Compression fractures are noted in T4, T5, T6, T7, T8, T9 and T10 vertebral bodies. No significant interval change. There is previous vertebroplasty in the bodies of T8, T9 and L1 vertebrae with no significant change. Paraspinal and other soft tissues: There is no central spinal stenosis. Paraspinal soft tissues are unremarkable. Disc levels: These calcification in the disc space at T10-T11 level. There is no significant narrowing of neural foramina. IMPRESSION: There are multiple old compression fractures in thoracic and upper lumbar vertebral bodies as described in the body with no significant change. There is vertebroplasty in few thoracic and lumbar vertebral bodies with no significant change. No recent fracture is seen. There is no significant spinal stenosis. Paraspinal soft tissues are unremarkable. Electronically Signed   By: Ernie Avena M.D.   On: 04/21/2022 14:29   CT Angio Chest PE W and/or Wo Contrast  Result Date: 04/21/2022 CLINICAL DATA:  Back pain. EXAM: CT  ANGIOGRAPHY CHEST WITH CONTRAST TECHNIQUE: Multidetector CT imaging of the chest was performed using the standard protocol during bolus administration of intravenous contrast. Multiplanar CT image reconstructions and MIPs were obtained to evaluate the vascular anatomy. RADIATION DOSE REDUCTION: This exam was performed according to the departmental dose-optimization program which includes automated exposure control, adjustment of the mA and/or kV according to patient size and/or use of iterative reconstruction technique. CONTRAST:  60mL OMNIPAQUE IOHEXOL 350 MG/ML SOLN COMPARISON:  March 19, 2018. FINDINGS: Cardiovascular: Satisfactory opacification of the pulmonary arteries to the segmental level. No evidence of pulmonary embolism. Normal heart size. Small pericardial effusion. Minimal coronary artery calcifications are noted. Mediastinum/Nodes: No enlarged mediastinal, hilar, or axillary lymph nodes. Thyroid gland, trachea, and esophagus demonstrate no significant findings. Lungs/Pleura: Lungs are clear. No pleural effusion or pneumothorax. Upper Abdomen: No acute abnormality. Musculoskeletal: Stable kyphoplasty at multiple levels as well as  multiple old compression fractures. No definite acute osseous abnormality is noted. Review of the MIP images confirms the above findings. IMPRESSION: No definite evidence of pulmonary embolus. Small pericardial effusion. Minimal coronary artery calcifications are noted. Multiple old thoracic compression fractures are noted as well as kyphoplasty at multiple levels. Aortic Atherosclerosis (ICD10-I70.0). Electronically Signed   By: Lupita Raider M.D.   On: 04/21/2022 14:20   DG Lumbar Spine Complete  Result Date: 04/21/2022 CLINICAL DATA:  Evaluate for compression fracture or pleural effusion. EXAM: LUMBAR SPINE - COMPLETE 4+ VIEW COMPARISON:  None Available. FINDINGS: There is no evidence of acute fracture. Grade 1 anterolisthesis of L5 at S1. Vertebral augmentation of L1  vertebral body. Mild superior endplate deformity of L3 vertebral body, unchanged. Facet joint arthropathy prominent at L5-S1. Diffuse osteopenia. IMPRESSION: 1. Mild superior endplate deformity of L3 vertebral body. 2. Grade 1 anterolisthesis of L5 at S1. 3. Facet joint arthropathy prominent at L5-S1. Electronically Signed   By: Larose Hires D.O.   On: 04/21/2022 10:32   DG Chest 1 View  Result Date: 04/21/2022 CLINICAL DATA:  Evaluate for pleural effusion. EXAM: CHEST  1 VIEW COMPARISON:  Chest radiograph dated March 19, 2018 FINDINGS: The heart size and mediastinal contours are within normal limits. Both lungs are clear. Thoracic spondylosis and vertebral body augmentation at multiple levels. IMPRESSION: No active disease. Electronically Signed   By: Larose Hires D.O.   On: 04/21/2022 10:22   DG Thoracic Spine 2 View  Result Date: 04/21/2022 CLINICAL DATA:  Evaluate for compression fractures, pleural effusion. EXAM: THORACIC SPINE 2 VIEWS COMPARISON:  Chest radiograph dated March 19, 2018 FINDINGS: There is no evidence of thoracic spine fracture. Kyphosis with vertebral body augmentation at multiple levels and wedge-shaped deformities of multiple vertebral bodies is not significantly changed. IMPRESSION: 1. No acute fracture. 2. Kyphosis with vertebral body augmentation at multiple levels and wedge-shaped deformities of multiple vertebral bodies is not significantly changed. Electronically Signed   By: Larose Hires D.O.   On: 04/21/2022 10:21     TODAY-DAY OF DISCHARGE:  Subjective:   Katiria Oriol today has no headache,no chest abdominal pain,no new weakness tingling or numbness, feels much better wants to go home today.   Objective:   Blood pressure 113/67, pulse 100, temperature 98.3 F (36.8 C), temperature source Oral, resp. rate 17, height 5\' 3"  (1.6 m), weight 72.3 kg, last menstrual period 06/24/2003, SpO2 95 %.  Intake/Output Summary (Last 24 hours) at 04/23/2022 1424 Last data filed at  04/22/2022 2030 Gross per 24 hour  Intake 620 ml  Output --  Net 620 ml   Filed Weights   04/21/22 0758 04/22/22 0500 04/23/22 0500  Weight: 61.2 kg 73.9 kg 72.3 kg    Exam: Awake Alert, Oriented *3, No new F.N deficits, Normal affect [place].AT,PERRAL Supple Neck,No JVD, No cervical lymphadenopathy appriciated.  Symmetrical Chest wall movement, Good air movement bilaterally, CTAB RRR,No Gallops,Rubs or new Murmurs, No Parasternal Heave +ve B.Sounds, Abd Soft, Non tender, No organomegaly appriciated, No rebound -guarding or rigidity. No Cyanosis, Clubbing or edema, No new Rash or bruise   PERTINENT RADIOLOGIC STUDIES: VAS [place] LOWER EXTREMITY VENOUS (DVT)  Result Date: 04/23/2022  Lower Venous DVT Study Patient Name:  SYMPHANY HUCKABEE  Date of Exam:   04/23/2022 Medical Rec #: [id number]      Accession #:    [id number] Date of Birth: 03/21/1950       Patient Gender: F Patient Age:   64 years  Exam Location:  [facility] Procedure:      VAS [place] LOWER EXTREMITY VENOUS (DVT) Referring Phys: Jeoffrey Massed --------------------------------------------------------------------------------  Indications: Edema.  Comparison Study: No previous exams Performing Technologist: Jody Hill RVT, RDMS  Examination Guidelines: A complete evaluation includes B-mode imaging, spectral Doppler, color Doppler, and power Doppler as needed of all accessible portions of each vessel. Bilateral testing is considered an integral part of a complete examination. Limited examinations for reoccurring indications may be performed as noted. The reflux portion of the exam is performed with the patient in reverse Trendelenburg.  +---------+---------------+---------+-----------+----------+--------------+ RIGHT    CompressibilityPhasicitySpontaneityPropertiesThrombus Aging +---------+---------------+---------+-----------+----------+--------------+ CFV      Full           Yes      Yes                                  +---------+---------------+---------+-----------+----------+--------------+ SFJ      Full                                                        +---------+---------------+---------+-----------+----------+--------------+ FV Prox  Full           Yes      Yes                                 +---------+---------------+---------+-----------+----------+--------------+ FV Mid   Full           Yes      Yes                                 +---------+---------------+---------+-----------+----------+--------------+ FV DistalFull           Yes      Yes                                 +---------+---------------+---------+-----------+----------+--------------+ PFV      Full                                                        +---------+---------------+---------+-----------+----------+--------------+ POP      Full           Yes      Yes                                 +---------+---------------+---------+-----------+----------+--------------+ PTV      Full                                                        +---------+---------------+---------+-----------+----------+--------------+ PERO     Full                                                        +---------+---------------+---------+-----------+----------+--------------+   +---------+---------------+---------+-----------+----------+--------------+  LEFT     CompressibilityPhasicitySpontaneityPropertiesThrombus Aging +---------+---------------+---------+-----------+----------+--------------+ CFV      Full           Yes      Yes                                 +---------+---------------+---------+-----------+----------+--------------+ SFJ      Full                                                        +---------+---------------+---------+-----------+----------+--------------+ FV Prox  Full           Yes      Yes                                  +---------+---------------+---------+-----------+----------+--------------+ FV Mid   Full           Yes      Yes                                 +---------+---------------+---------+-----------+----------+--------------+ FV DistalFull           Yes      Yes                                 +---------+---------------+---------+-----------+----------+--------------+ PFV      Full                                                        +---------+---------------+---------+-----------+----------+--------------+ POP      Full           Yes      Yes                                 +---------+---------------+---------+-----------+----------+--------------+ PTV      Full                                                        +---------+---------------+---------+-----------+----------+--------------+ PERO     Full                                                        +---------+---------------+---------+-----------+----------+--------------+    Summary: BILATERAL: - No evidence of deep vein thrombosis seen in the lower extremities, bilaterally. -No evidence of popliteal cyst, bilaterally.   *See table(s) above for measurements and observations.    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 Bilateral testing is considered an integral part of a complete examination. Limited examinations for reoccurring indications may be performed as noted.  Right Findings: +----------+------------+---------+-----------+----------+---------------------+ RIGHT     CompressiblePhasicitySpontaneousProperties       Summary        +----------+------------+---------+-----------+----------+---------------------+ IJV           Full       Yes       Yes                                    +----------+------------+---------+-----------+----------+---------------------+ Subclavian    Full       Yes       Yes                                    +----------+------------+---------+-----------+----------+---------------------+ Axillary      Full       Yes       Yes                                    +----------+------------+---------+-----------+----------+---------------------+ Brachial      Full       Yes       Yes               only one of paired                                                       brachials visualized  +----------+------------+---------+-----------+----------+---------------------+ Radial        Full                                                        +----------+------------+---------+-----------+----------+---------------------+ Ulnar         Full                                                        +----------+------------+---------+-----------+----------+---------------------+ Cephalic      Full                                                        +----------+------------+---------+-----------+----------+---------------------+  Basilic       Full       Yes       Yes                                    +----------+------------+---------+-----------+----------+---------------------+  Left Findings: +----------+------------+---------+-----------+----------+-------+ LEFT       CompressiblePhasicitySpontaneousPropertiesSummary +----------+------------+---------+-----------+----------+-------+ Subclavian    Full       Yes       Yes                      +----------+------------+---------+-----------+----------+-------+  Summary:  Right: No evidence of deep vein thrombosis in the upper extremity. No evidence of superficial vein thrombosis in the upper extremity.  Left: No evidence of thrombosis in the subclavian.  *See table(s) above for measurements and observations.     Preliminary    MR THORACIC SPINE W WO CONTRAST  Result Date: 04/22/2022 CLINICAL DATA:  Mid back pain.  Concern for discitis osteomyelitis. EXAM: MRI THORACIC WITHOUT AND WITH CONTRAST TECHNIQUE: Multiplanar and multiecho pulse sequences of the thoracic spine were obtained without and with intravenous contrast. CONTRAST:  7mL GADAVIST GADOBUTROL 1 MMOL/ML IV SOLN COMPARISON:  Thoracic spine CT 04/21/2022 FINDINGS: Alignment: Exaggerated thoracic kyphosis. Vertebrae: Severe chronic appearing midthoracic spine compression deformities at T7-T10. Additional compression deformities are also present at L1 and L3. There is no evidence of abnormal contrast enhancement in the intervertebral discs or at the endplates to suggest discitis osteomyelitis. Cord:  No evidence of cord signal abnormality. Paraspinal and other soft tissues: There may be an abnormal flow void in the right internal jugular vein (series 5, image 4), which can be seen in the setting of a DVT. Recommend further evaluation with a right upper extremity DVT ultrasound. There are small bilateral pleural effusions. Disc levels: No evidence of high-grade spinal canal stenosis. There is severe left-sided neural foraminal stenosis at T8-T9. IMPRESSION: 1. No evidence of discitis osteomyelitis in the thoracic spine. 2. 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 No high-grade spinal canal stenosis. 3. Severe chronic appearing midthoracic spine  compression deformities at T7-T10. Additional compression deformities are also present at L1 and L3. 4. Possible abnormal flow void in the right internal jugular vein, which can be seen in the setting of a DVT. Recommend further evaluation with a right upper extremity DVT ultrasound. 5. Small bilateral pleural effusions. Electronically Signed   By: Lorenza Cambridge M.D.   On: 04/22/2022 13:36     PERTINENT LAB RESULTS: CBC: Recent Labs    04/22/22 0411 04/23/22 0704  WBC 12.2* 9.9  HGB 9.7* 10.8*  HCT 30.0* 32.0*  PLT 269 276   CMET CMP     Component Value Date/Time   NA 135 04/23/2022 0435   K 3.3 (L) 04/23/2022 0435   CL 105 04/23/2022 0435   CO2 22 04/23/2022 0435   GLUCOSE 94 04/23/2022 0435   BUN 6 (L) 04/23/2022 0435   CREATININE 0.63 04/23/2022 0435   CALCIUM 8.6 (L) 04/23/2022 0435   PROT 6.0 (L) 04/23/2022 0435   ALBUMIN 2.7 (L) 04/23/2022 0435   AST 17 04/23/2022 0435   ALT 13 04/23/2022 0435   ALKPHOS 69 04/23/2022 0435   BILITOT 0.6 04/23/2022 0435   GFRNONAA >60 04/23/2022 0435   GFRAA >60 03/22/2018 0412    GFR Estimated Creatinine Clearance: 63.3 mL/min (by C-G formula based on SCr of 0.63 mg/dL). Recent Labs  04/22/22 0411  LIPASE 22   No results for input(s): "CKTOTAL", "CKMB", "CKMBINDEX", "TROPONINI" in the last 72 hours. Invalid input(s): "POCBNP" No results for input(s): "DDIMER" in the last 72 hours. No results for input(s): "HGBA1C" in the last 72 hours. No results for input(s): "CHOL", "HDL", "LDLCALC", "TRIG", "CHOLHDL", "LDLDIRECT" in the last 72 hours. Recent Labs    04/22/22 0411  TSH 2.029   No results for input(s): "VITAMINB12", "FOLATE", "FERRITIN", "TIBC", "IRON", "RETICCTPCT" in the last 72 hours. Coags: No results for input(s): "INR" in the last 72 hours.  Invalid input(s): "PT" Microbiology: Recent Results (from the past 240 hour(s))  Blood culture (routine x 2)     Status: None (Preliminary result)   Collection Time:  04/21/22  3:56 PM   Specimen: BLOOD LEFT HAND  Result Value Ref Range Status   Specimen Description   Final    BLOOD LEFT HAND Performed at [facility], [address], [place], [place] [address]    Special Requests   Final    BOTTLES DRAWN AEROBIC AND ANAEROBIC Blood Culture adequate volume Performed at [facility], [address] [place], [place] [address]    Culture   Final    NO GROWTH 2 DAYS Performed at [facility], [address] [place], [place] [address]    Report Status PENDING  Incomplete    FURTHER DISCHARGE INSTRUCTIONS:  Get Medicines reviewed and adjusted: Please take all your medications with you for your next visit with your Primary MD  Laboratory/radiological data: Please request your Primary MD to go over all hospital tests and procedure/radiological results at the follow up, please ask your Primary MD to get all Hospital records sent to his/her office.  In some cases, they will be blood work, cultures and biopsy results pending at the time of your discharge. Please request that your primary care M.D. goes through all the records of your hospital data and follows up on these results.  Also Note the following: If you experience worsening of your admission symptoms, develop shortness of breath, life threatening emergency, suicidal or homicidal thoughts you must seek medical attention immediately by calling 911 or calling your MD immediately  if symptoms less severe.  You must read complete instructions/literature along with all the possible adverse reactions/side effects for all the Medicines you take and that have been prescribed to you. Take any new Medicines after you have completely understood and accpet all the possible adverse reactions/side effects.   Do not drive when taking Pain medications or sleeping medications (Benzodaizepines)  Do not take more than prescribed Pain, Sleep and Anxiety Medications. It is not  advisable to combine anxiety,sleep and pain medications without talking with your primary care practitioner  Special Instructions: If you have smoked or chewed Tobacco  in the last 2 yrs please stop smoking, stop any regular Alcohol  and or any Recreational drug use.  Wear Seat belts while driving.  Please note: You were cared for by a hospitalist during your hospital stay. Once you are discharged, your primary care physician will handle any further medical issues. Please note that NO REFILLS for any discharge medications will be authorized once you are discharged, as it is imperative that you return to your primary care physician (or establish a relationship with a primary care physician if you do not have one) for your post hospital discharge needs so that they can reassess your need for medications and monitor your lab values.  Total Time  spent coordinating discharge including counseling, education and face to face time equals greater than 30 minutes.  SignedJeoffrey Massed 04/23/2022 2:24 PM

## 2022-04-23 NOTE — Progress Notes (Signed)
Mobility Specialist - Progress Note    04/23/22 1045  Mobility  Activity Ambulated with assistance in hallway  Level of Assistance Standby assist, set-up cues, supervision of patient - no hands on  Assistive Device Front wheel walker  Distance Ambulated (ft) 200 ft  Activity Response Tolerated well  Mobility Referral Yes  $Mobility charge 1 Mobility    Pre-mobility: 92 HR During mobility:115 HR  Pt was received in bed and agreeable to mobility. No complaints throughout session. Upon return to room pt requested to use BR. Pt with successful void. Pt was returned to bed with all needs met.   Alda Lea  Mobility Specialist Please contact via Special educational needs teacher or Rehab office at [phone]

## 2022-04-25 LAB — CULTURE, BLOOD (ROUTINE X 2)

## 2022-04-26 LAB — CULTURE, BLOOD (ROUTINE X 2): Special Requests: ADEQUATE

## 2022-05-06 DIAGNOSIS — M546 Pain in thoracic spine: Secondary | ICD-10-CM | POA: Diagnosis not present

## 2022-05-06 DIAGNOSIS — D72829 Elevated white blood cell count, unspecified: Secondary | ICD-10-CM | POA: Diagnosis not present

## 2022-05-06 DIAGNOSIS — Z79899 Other long term (current) drug therapy: Secondary | ICD-10-CM | POA: Diagnosis not present

## 2022-05-06 DIAGNOSIS — Z6828 Body mass index (BMI) 28.0-28.9, adult: Secondary | ICD-10-CM | POA: Diagnosis not present

## 2022-05-28 DIAGNOSIS — D649 Anemia, unspecified: Secondary | ICD-10-CM | POA: Diagnosis not present

## 2022-08-27 DIAGNOSIS — F4321 Adjustment disorder with depressed mood: Secondary | ICD-10-CM | POA: Diagnosis not present

## 2022-08-27 DIAGNOSIS — D649 Anemia, unspecified: Secondary | ICD-10-CM | POA: Diagnosis not present

## 2022-08-27 DIAGNOSIS — E038 Other specified hypothyroidism: Secondary | ICD-10-CM | POA: Diagnosis not present

## 2022-08-27 DIAGNOSIS — Z6827 Body mass index (BMI) 27.0-27.9, adult: Secondary | ICD-10-CM | POA: Diagnosis not present

## 2022-08-27 DIAGNOSIS — R251 Tremor, unspecified: Secondary | ICD-10-CM | POA: Diagnosis not present

## 2022-08-27 DIAGNOSIS — M810 Age-related osteoporosis without current pathological fracture: Secondary | ICD-10-CM | POA: Diagnosis not present

## 2022-08-27 DIAGNOSIS — E785 Hyperlipidemia, unspecified: Secondary | ICD-10-CM | POA: Diagnosis not present

## 2022-08-27 DIAGNOSIS — K5903 Drug induced constipation: Secondary | ICD-10-CM | POA: Diagnosis not present

## 2022-09-04 DIAGNOSIS — M810 Age-related osteoporosis without current pathological fracture: Secondary | ICD-10-CM | POA: Diagnosis not present

## 2022-09-04 DIAGNOSIS — Z87891 Personal history of nicotine dependence: Secondary | ICD-10-CM | POA: Diagnosis not present

## 2022-09-04 DIAGNOSIS — Z8781 Personal history of (healed) traumatic fracture: Secondary | ICD-10-CM | POA: Diagnosis not present

## 2022-09-22 DIAGNOSIS — H26492 Other secondary cataract, left eye: Secondary | ICD-10-CM | POA: Diagnosis not present

## 2022-09-22 DIAGNOSIS — H524 Presbyopia: Secondary | ICD-10-CM | POA: Diagnosis not present

## 2022-09-22 DIAGNOSIS — H52203 Unspecified astigmatism, bilateral: Secondary | ICD-10-CM | POA: Diagnosis not present

## 2022-09-22 DIAGNOSIS — H0100B Unspecified blepharitis left eye, upper and lower eyelids: Secondary | ICD-10-CM | POA: Diagnosis not present

## 2022-09-22 DIAGNOSIS — H0100A Unspecified blepharitis right eye, upper and lower eyelids: Secondary | ICD-10-CM | POA: Diagnosis not present

## 2022-09-22 DIAGNOSIS — H04123 Dry eye syndrome of bilateral lacrimal glands: Secondary | ICD-10-CM | POA: Diagnosis not present

## 2022-09-22 DIAGNOSIS — H43813 Vitreous degeneration, bilateral: Secondary | ICD-10-CM | POA: Diagnosis not present

## 2022-11-27 DIAGNOSIS — Z23 Encounter for immunization: Secondary | ICD-10-CM | POA: Diagnosis not present

## 2023-01-19 DIAGNOSIS — M810 Age-related osteoporosis without current pathological fracture: Secondary | ICD-10-CM | POA: Diagnosis not present

## 2023-01-19 DIAGNOSIS — Z87891 Personal history of nicotine dependence: Secondary | ICD-10-CM | POA: Diagnosis not present

## 2023-01-19 DIAGNOSIS — M549 Dorsalgia, unspecified: Secondary | ICD-10-CM | POA: Diagnosis not present

## 2023-01-19 DIAGNOSIS — Z8781 Personal history of (healed) traumatic fracture: Secondary | ICD-10-CM | POA: Diagnosis not present

## 2023-01-21 DIAGNOSIS — M545 Low back pain, unspecified: Secondary | ICD-10-CM | POA: Diagnosis not present

## 2023-01-21 DIAGNOSIS — M5451 Vertebrogenic low back pain: Secondary | ICD-10-CM | POA: Diagnosis not present

## 2023-02-01 DIAGNOSIS — M5451 Vertebrogenic low back pain: Secondary | ICD-10-CM | POA: Diagnosis not present

## 2023-02-04 DIAGNOSIS — M5451 Vertebrogenic low back pain: Secondary | ICD-10-CM | POA: Diagnosis not present

## 2023-02-08 DIAGNOSIS — M5451 Vertebrogenic low back pain: Secondary | ICD-10-CM | POA: Diagnosis not present

## 2023-02-11 DIAGNOSIS — M5451 Vertebrogenic low back pain: Secondary | ICD-10-CM | POA: Diagnosis not present

## 2023-02-15 DIAGNOSIS — M5451 Vertebrogenic low back pain: Secondary | ICD-10-CM | POA: Diagnosis not present

## 2023-02-18 DIAGNOSIS — M5451 Vertebrogenic low back pain: Secondary | ICD-10-CM | POA: Diagnosis not present

## 2023-03-01 DIAGNOSIS — D649 Anemia, unspecified: Secondary | ICD-10-CM | POA: Diagnosis not present

## 2023-03-01 DIAGNOSIS — E785 Hyperlipidemia, unspecified: Secondary | ICD-10-CM | POA: Diagnosis not present

## 2023-03-01 DIAGNOSIS — Z79899 Other long term (current) drug therapy: Secondary | ICD-10-CM | POA: Diagnosis not present

## 2023-03-01 DIAGNOSIS — E038 Other specified hypothyroidism: Secondary | ICD-10-CM | POA: Diagnosis not present

## 2023-03-02 DIAGNOSIS — M5451 Vertebrogenic low back pain: Secondary | ICD-10-CM | POA: Diagnosis not present

## 2023-03-08 DIAGNOSIS — Z6826 Body mass index (BMI) 26.0-26.9, adult: Secondary | ICD-10-CM | POA: Diagnosis not present

## 2023-03-08 DIAGNOSIS — M810 Age-related osteoporosis without current pathological fracture: Secondary | ICD-10-CM | POA: Diagnosis not present

## 2023-03-08 DIAGNOSIS — E785 Hyperlipidemia, unspecified: Secondary | ICD-10-CM | POA: Diagnosis not present

## 2023-03-08 DIAGNOSIS — R29898 Other symptoms and signs involving the musculoskeletal system: Secondary | ICD-10-CM | POA: Diagnosis not present

## 2023-03-08 DIAGNOSIS — R634 Abnormal weight loss: Secondary | ICD-10-CM | POA: Diagnosis not present

## 2023-03-08 DIAGNOSIS — Z0000 Encounter for general adult medical examination without abnormal findings: Secondary | ICD-10-CM | POA: Diagnosis not present

## 2023-03-08 DIAGNOSIS — D649 Anemia, unspecified: Secondary | ICD-10-CM | POA: Diagnosis not present

## 2023-03-08 DIAGNOSIS — Z1331 Encounter for screening for depression: Secondary | ICD-10-CM | POA: Diagnosis not present

## 2023-03-08 DIAGNOSIS — E038 Other specified hypothyroidism: Secondary | ICD-10-CM | POA: Diagnosis not present

## 2023-03-08 DIAGNOSIS — Z79899 Other long term (current) drug therapy: Secondary | ICD-10-CM | POA: Diagnosis not present

## 2023-03-08 DIAGNOSIS — R251 Tremor, unspecified: Secondary | ICD-10-CM | POA: Diagnosis not present

## 2023-03-08 DIAGNOSIS — M545 Low back pain, unspecified: Secondary | ICD-10-CM | POA: Diagnosis not present

## 2023-03-08 DIAGNOSIS — Z2911 Encounter for prophylactic immunotherapy for respiratory syncytial virus (RSV): Secondary | ICD-10-CM | POA: Diagnosis not present

## 2023-03-09 DIAGNOSIS — M5451 Vertebrogenic low back pain: Secondary | ICD-10-CM | POA: Diagnosis not present

## 2023-03-19 DIAGNOSIS — M5451 Vertebrogenic low back pain: Secondary | ICD-10-CM | POA: Diagnosis not present

## 2023-03-31 DIAGNOSIS — M5451 Vertebrogenic low back pain: Secondary | ICD-10-CM | POA: Diagnosis not present

## 2023-04-07 DIAGNOSIS — M5451 Vertebrogenic low back pain: Secondary | ICD-10-CM | POA: Diagnosis not present

## 2023-04-14 DIAGNOSIS — M5451 Vertebrogenic low back pain: Secondary | ICD-10-CM | POA: Diagnosis not present

## 2023-04-21 DIAGNOSIS — M5451 Vertebrogenic low back pain: Secondary | ICD-10-CM | POA: Diagnosis not present

## 2023-09-06 DIAGNOSIS — E038 Other specified hypothyroidism: Secondary | ICD-10-CM | POA: Diagnosis not present

## 2023-09-09 DIAGNOSIS — Z6825 Body mass index (BMI) 25.0-25.9, adult: Secondary | ICD-10-CM | POA: Diagnosis not present

## 2023-09-09 DIAGNOSIS — D649 Anemia, unspecified: Secondary | ICD-10-CM | POA: Diagnosis not present

## 2023-09-09 DIAGNOSIS — E038 Other specified hypothyroidism: Secondary | ICD-10-CM | POA: Diagnosis not present

## 2023-09-09 DIAGNOSIS — M810 Age-related osteoporosis without current pathological fracture: Secondary | ICD-10-CM | POA: Diagnosis not present

## 2023-09-09 DIAGNOSIS — E785 Hyperlipidemia, unspecified: Secondary | ICD-10-CM | POA: Diagnosis not present

## 2023-09-09 DIAGNOSIS — R251 Tremor, unspecified: Secondary | ICD-10-CM | POA: Diagnosis not present

## 2023-09-15 DIAGNOSIS — Z8781 Personal history of (healed) traumatic fracture: Secondary | ICD-10-CM | POA: Diagnosis not present

## 2023-09-15 DIAGNOSIS — E038 Other specified hypothyroidism: Secondary | ICD-10-CM | POA: Diagnosis not present

## 2023-09-15 DIAGNOSIS — Z87891 Personal history of nicotine dependence: Secondary | ICD-10-CM | POA: Diagnosis not present

## 2023-09-15 DIAGNOSIS — M810 Age-related osteoporosis without current pathological fracture: Secondary | ICD-10-CM | POA: Diagnosis not present

## 2023-10-13 DIAGNOSIS — Z23 Encounter for immunization: Secondary | ICD-10-CM | POA: Diagnosis not present
